# Patient Record
Sex: Female | Born: 1945 | Race: White | Hispanic: No | Marital: Married | Smoking: Former smoker
Health system: Southern US, Community
[De-identification: ages and names within clinical notes are randomized; demographics above are authoritative.]

## PROBLEM LIST (undated history)

## (undated) DIAGNOSIS — C679 Malignant neoplasm of bladder, unspecified: Secondary | ICD-10-CM

## (undated) DIAGNOSIS — C189 Malignant neoplasm of colon, unspecified: Secondary | ICD-10-CM

---

## 2012-06-08 DIAGNOSIS — Z01810 Encounter for preprocedural cardiovascular examination: Secondary | ICD-10-CM

## 2012-06-29 ENCOUNTER — Encounter: Payer: Medicare Other | Admitting: Hematology and Oncology

## 2012-06-29 DIAGNOSIS — C187 Malignant neoplasm of sigmoid colon: Secondary | ICD-10-CM

## 2012-06-29 DIAGNOSIS — Z800 Family history of malignant neoplasm of digestive organs: Secondary | ICD-10-CM

## 2012-06-29 DIAGNOSIS — Z808 Family history of malignant neoplasm of other organs or systems: Secondary | ICD-10-CM

## 2012-07-09 ENCOUNTER — Encounter: Payer: Medicare Other | Admitting: Hematology and Oncology

## 2012-07-09 ENCOUNTER — Other Ambulatory Visit: Payer: Self-pay | Admitting: Hematology and Oncology

## 2012-07-09 DIAGNOSIS — C189 Malignant neoplasm of colon, unspecified: Secondary | ICD-10-CM

## 2012-07-09 DIAGNOSIS — R970 Elevated carcinoembryonic antigen [CEA]: Secondary | ICD-10-CM

## 2012-07-12 ENCOUNTER — Encounter (HOSPITAL_COMMUNITY)
Admission: RE | Admit: 2012-07-12 | Discharge: 2012-07-12 | Disposition: A | Discharge status: Home / Self Care | Payer: Medicare Other | Source: Ambulatory Visit | Attending: Hematology and Oncology | Admitting: Hematology and Oncology

## 2012-07-12 DIAGNOSIS — I2510 Atherosclerotic heart disease of native coronary artery without angina pectoris: Secondary | ICD-10-CM | POA: Insufficient documentation

## 2012-07-12 DIAGNOSIS — I517 Cardiomegaly: Secondary | ICD-10-CM | POA: Insufficient documentation

## 2012-07-12 DIAGNOSIS — C189 Malignant neoplasm of colon, unspecified: Secondary | ICD-10-CM | POA: Insufficient documentation

## 2012-07-12 MED ORDER — FLUDEOXYGLUCOSE F - 18 (FDG) INJECTION
19.1000 | Freq: Once | INTRAVENOUS | Status: AC | PRN
  Administered 2012-07-12: 19.1 via INTRAVENOUS

## 2012-07-13 ENCOUNTER — Encounter: Payer: Medicare Other | Admitting: Hematology and Oncology

## 2012-07-13 DIAGNOSIS — R970 Elevated carcinoembryonic antigen [CEA]: Secondary | ICD-10-CM

## 2012-07-13 DIAGNOSIS — C187 Malignant neoplasm of sigmoid colon: Secondary | ICD-10-CM

## 2012-07-24 ENCOUNTER — Encounter: Payer: Medicare Other | Admitting: Hematology and Oncology

## 2012-07-24 DIAGNOSIS — C189 Malignant neoplasm of colon, unspecified: Secondary | ICD-10-CM

## 2012-07-24 DIAGNOSIS — Z5111 Encounter for antineoplastic chemotherapy: Secondary | ICD-10-CM

## 2012-07-26 DIAGNOSIS — C189 Malignant neoplasm of colon, unspecified: Secondary | ICD-10-CM

## 2012-08-06 DIAGNOSIS — R970 Elevated carcinoembryonic antigen [CEA]: Secondary | ICD-10-CM

## 2012-08-06 DIAGNOSIS — C3490 Malignant neoplasm of unspecified part of unspecified bronchus or lung: Secondary | ICD-10-CM

## 2012-08-17 ENCOUNTER — Encounter: Payer: Medicare Other | Admitting: Hematology and Oncology

## 2012-08-17 DIAGNOSIS — C189 Malignant neoplasm of colon, unspecified: Secondary | ICD-10-CM

## 2012-08-17 DIAGNOSIS — R970 Elevated carcinoembryonic antigen [CEA]: Secondary | ICD-10-CM

## 2012-08-21 DIAGNOSIS — C189 Malignant neoplasm of colon, unspecified: Secondary | ICD-10-CM

## 2012-08-21 DIAGNOSIS — Z5111 Encounter for antineoplastic chemotherapy: Secondary | ICD-10-CM

## 2012-08-23 DIAGNOSIS — C189 Malignant neoplasm of colon, unspecified: Secondary | ICD-10-CM

## 2012-08-23 DIAGNOSIS — Z452 Encounter for adjustment and management of vascular access device: Secondary | ICD-10-CM

## 2012-09-03 ENCOUNTER — Encounter: Payer: Medicare Other | Admitting: Hematology and Oncology

## 2012-09-03 DIAGNOSIS — C187 Malignant neoplasm of sigmoid colon: Secondary | ICD-10-CM

## 2012-09-10 ENCOUNTER — Encounter: Payer: Medicare Other | Admitting: Hematology and Oncology

## 2012-09-10 DIAGNOSIS — C187 Malignant neoplasm of sigmoid colon: Secondary | ICD-10-CM

## 2012-09-10 DIAGNOSIS — R970 Elevated carcinoembryonic antigen [CEA]: Secondary | ICD-10-CM

## 2012-09-11 DIAGNOSIS — C189 Malignant neoplasm of colon, unspecified: Secondary | ICD-10-CM

## 2012-09-11 DIAGNOSIS — Z5111 Encounter for antineoplastic chemotherapy: Secondary | ICD-10-CM

## 2012-09-13 DIAGNOSIS — C189 Malignant neoplasm of colon, unspecified: Secondary | ICD-10-CM

## 2012-09-14 DIAGNOSIS — T451X5A Adverse effect of antineoplastic and immunosuppressive drugs, initial encounter: Secondary | ICD-10-CM

## 2012-09-14 DIAGNOSIS — C189 Malignant neoplasm of colon, unspecified: Secondary | ICD-10-CM

## 2012-09-14 DIAGNOSIS — D702 Other drug-induced agranulocytosis: Secondary | ICD-10-CM

## 2012-10-01 DIAGNOSIS — C189 Malignant neoplasm of colon, unspecified: Secondary | ICD-10-CM

## 2012-10-02 DIAGNOSIS — C189 Malignant neoplasm of colon, unspecified: Secondary | ICD-10-CM

## 2012-10-02 DIAGNOSIS — Z5111 Encounter for antineoplastic chemotherapy: Secondary | ICD-10-CM

## 2012-10-04 DIAGNOSIS — C189 Malignant neoplasm of colon, unspecified: Secondary | ICD-10-CM

## 2012-10-05 DIAGNOSIS — C189 Malignant neoplasm of colon, unspecified: Secondary | ICD-10-CM

## 2012-10-05 DIAGNOSIS — T451X5A Adverse effect of antineoplastic and immunosuppressive drugs, initial encounter: Secondary | ICD-10-CM

## 2012-10-05 DIAGNOSIS — D702 Other drug-induced agranulocytosis: Secondary | ICD-10-CM

## 2012-10-22 DIAGNOSIS — C189 Malignant neoplasm of colon, unspecified: Secondary | ICD-10-CM

## 2012-10-23 DIAGNOSIS — C189 Malignant neoplasm of colon, unspecified: Secondary | ICD-10-CM

## 2012-10-23 DIAGNOSIS — Z5111 Encounter for antineoplastic chemotherapy: Secondary | ICD-10-CM

## 2012-10-25 DIAGNOSIS — C187 Malignant neoplasm of sigmoid colon: Secondary | ICD-10-CM

## 2012-10-26 DIAGNOSIS — T451X5A Adverse effect of antineoplastic and immunosuppressive drugs, initial encounter: Secondary | ICD-10-CM

## 2012-10-26 DIAGNOSIS — C189 Malignant neoplasm of colon, unspecified: Secondary | ICD-10-CM

## 2012-10-26 DIAGNOSIS — D702 Other drug-induced agranulocytosis: Secondary | ICD-10-CM

## 2012-11-12 DIAGNOSIS — C189 Malignant neoplasm of colon, unspecified: Secondary | ICD-10-CM

## 2012-11-12 DIAGNOSIS — Z5111 Encounter for antineoplastic chemotherapy: Secondary | ICD-10-CM

## 2012-11-14 DIAGNOSIS — C189 Malignant neoplasm of colon, unspecified: Secondary | ICD-10-CM

## 2012-12-04 DIAGNOSIS — T451X5A Adverse effect of antineoplastic and immunosuppressive drugs, initial encounter: Secondary | ICD-10-CM

## 2012-12-04 DIAGNOSIS — C189 Malignant neoplasm of colon, unspecified: Secondary | ICD-10-CM

## 2012-12-04 DIAGNOSIS — H5710 Ocular pain, unspecified eye: Secondary | ICD-10-CM

## 2012-12-05 DIAGNOSIS — Z5111 Encounter for antineoplastic chemotherapy: Secondary | ICD-10-CM

## 2012-12-05 DIAGNOSIS — C189 Malignant neoplasm of colon, unspecified: Secondary | ICD-10-CM

## 2012-12-07 DIAGNOSIS — C189 Malignant neoplasm of colon, unspecified: Secondary | ICD-10-CM

## 2012-12-24 DIAGNOSIS — C187 Malignant neoplasm of sigmoid colon: Secondary | ICD-10-CM

## 2012-12-25 DIAGNOSIS — C189 Malignant neoplasm of colon, unspecified: Secondary | ICD-10-CM

## 2012-12-25 DIAGNOSIS — Z5111 Encounter for antineoplastic chemotherapy: Secondary | ICD-10-CM

## 2012-12-27 DIAGNOSIS — C189 Malignant neoplasm of colon, unspecified: Secondary | ICD-10-CM

## 2013-01-15 DIAGNOSIS — C187 Malignant neoplasm of sigmoid colon: Secondary | ICD-10-CM

## 2013-01-15 DIAGNOSIS — D696 Thrombocytopenia, unspecified: Secondary | ICD-10-CM

## 2013-01-15 DIAGNOSIS — T451X5A Adverse effect of antineoplastic and immunosuppressive drugs, initial encounter: Secondary | ICD-10-CM

## 2013-01-15 DIAGNOSIS — G620 Drug-induced polyneuropathy: Secondary | ICD-10-CM

## 2013-01-16 DIAGNOSIS — Z5111 Encounter for antineoplastic chemotherapy: Secondary | ICD-10-CM

## 2013-01-16 DIAGNOSIS — C187 Malignant neoplasm of sigmoid colon: Secondary | ICD-10-CM

## 2013-01-18 DIAGNOSIS — C187 Malignant neoplasm of sigmoid colon: Secondary | ICD-10-CM

## 2013-01-30 DIAGNOSIS — Z5111 Encounter for antineoplastic chemotherapy: Secondary | ICD-10-CM

## 2013-01-30 DIAGNOSIS — C187 Malignant neoplasm of sigmoid colon: Secondary | ICD-10-CM

## 2013-02-01 DIAGNOSIS — C187 Malignant neoplasm of sigmoid colon: Secondary | ICD-10-CM

## 2013-02-13 DIAGNOSIS — C187 Malignant neoplasm of sigmoid colon: Secondary | ICD-10-CM

## 2013-02-13 DIAGNOSIS — Z5111 Encounter for antineoplastic chemotherapy: Secondary | ICD-10-CM

## 2013-02-15 DIAGNOSIS — C187 Malignant neoplasm of sigmoid colon: Secondary | ICD-10-CM

## 2013-03-01 DIAGNOSIS — C187 Malignant neoplasm of sigmoid colon: Secondary | ICD-10-CM

## 2013-03-01 DIAGNOSIS — R970 Elevated carcinoembryonic antigen [CEA]: Secondary | ICD-10-CM

## 2013-03-06 DIAGNOSIS — C189 Malignant neoplasm of colon, unspecified: Secondary | ICD-10-CM

## 2013-03-06 DIAGNOSIS — Z5111 Encounter for antineoplastic chemotherapy: Secondary | ICD-10-CM

## 2013-03-08 DIAGNOSIS — C187 Malignant neoplasm of sigmoid colon: Secondary | ICD-10-CM

## 2013-05-02 DIAGNOSIS — C189 Malignant neoplasm of colon, unspecified: Secondary | ICD-10-CM

## 2013-05-02 DIAGNOSIS — Z452 Encounter for adjustment and management of vascular access device: Secondary | ICD-10-CM

## 2013-05-14 DIAGNOSIS — Z800 Family history of malignant neoplasm of digestive organs: Secondary | ICD-10-CM

## 2013-05-14 DIAGNOSIS — C187 Malignant neoplasm of sigmoid colon: Secondary | ICD-10-CM

## 2013-05-15 ENCOUNTER — Telehealth: Payer: Self-pay | Admitting: Genetic Counselor

## 2013-05-15 NOTE — Telephone Encounter (Signed)
S/W PT IN [facility] APPT 07/21 @ 11 W/KAREN POWELL WELCOME PACKET MAILED.

## 2013-07-08 ENCOUNTER — Encounter: Payer: Medicare Other | Admitting: Genetic Counselor

## 2013-07-08 ENCOUNTER — Other Ambulatory Visit: Payer: Medicare Other | Admitting: Lab

## 2013-10-19 HISTORY — PX: COLON SURGERY: SHX602

## 2013-11-13 DIAGNOSIS — R970 Elevated carcinoembryonic antigen [CEA]: Secondary | ICD-10-CM

## 2013-11-13 DIAGNOSIS — C189 Malignant neoplasm of colon, unspecified: Secondary | ICD-10-CM

## 2015-12-10 ENCOUNTER — Other Ambulatory Visit: Payer: Self-pay | Admitting: Urology

## 2016-01-18 ENCOUNTER — Other Ambulatory Visit: Payer: Self-pay | Admitting: Urology

## 2016-01-26 ENCOUNTER — Inpatient Hospital Stay (HOSPITAL_COMMUNITY)
Admission: RE | Admit: 2016-01-26 | Discharge: 2016-02-05 | DRG: 654 | Disposition: A | Discharge status: Home Health | Payer: PPO | Source: Ambulatory Visit | Attending: Urology | Admitting: Urology

## 2016-01-26 ENCOUNTER — Encounter (HOSPITAL_COMMUNITY): Payer: Self-pay | Admitting: *Deleted

## 2016-01-26 DIAGNOSIS — Z936 Other artificial openings of urinary tract status: Secondary | ICD-10-CM

## 2016-01-26 DIAGNOSIS — M25559 Pain in unspecified hip: Secondary | ICD-10-CM | POA: Diagnosis not present

## 2016-01-26 DIAGNOSIS — K660 Peritoneal adhesions (postprocedural) (postinfection): Secondary | ICD-10-CM | POA: Diagnosis present

## 2016-01-26 DIAGNOSIS — Z87891 Personal history of nicotine dependence: Secondary | ICD-10-CM | POA: Diagnosis not present

## 2016-01-26 DIAGNOSIS — C799 Secondary malignant neoplasm of unspecified site: Secondary | ICD-10-CM | POA: Diagnosis not present

## 2016-01-26 DIAGNOSIS — N3946 Mixed incontinence: Secondary | ICD-10-CM | POA: Diagnosis present

## 2016-01-26 DIAGNOSIS — Z5331 Laparoscopic surgical procedure converted to open procedure: Secondary | ICD-10-CM

## 2016-01-26 DIAGNOSIS — C679 Malignant neoplasm of bladder, unspecified: Secondary | ICD-10-CM | POA: Diagnosis not present

## 2016-01-26 DIAGNOSIS — Z880 Allergy status to penicillin: Secondary | ICD-10-CM | POA: Diagnosis not present

## 2016-01-26 DIAGNOSIS — Z9049 Acquired absence of other specified parts of digestive tract: Secondary | ICD-10-CM

## 2016-01-26 DIAGNOSIS — D090 Carcinoma in situ of bladder: Secondary | ICD-10-CM | POA: Diagnosis present

## 2016-01-26 DIAGNOSIS — M549 Dorsalgia, unspecified: Secondary | ICD-10-CM | POA: Diagnosis not present

## 2016-01-26 DIAGNOSIS — Z85038 Personal history of other malignant neoplasm of large intestine: Secondary | ICD-10-CM

## 2016-01-26 DIAGNOSIS — K567 Ileus, unspecified: Secondary | ICD-10-CM | POA: Diagnosis not present

## 2016-01-26 DIAGNOSIS — K559 Vascular disorder of intestine, unspecified: Secondary | ICD-10-CM | POA: Diagnosis present

## 2016-01-26 DIAGNOSIS — D62 Acute posthemorrhagic anemia: Secondary | ICD-10-CM | POA: Diagnosis not present

## 2016-01-26 DIAGNOSIS — Z960 Presence of urogenital implants: Secondary | ICD-10-CM

## 2016-01-26 DIAGNOSIS — Z933 Colostomy status: Secondary | ICD-10-CM

## 2016-01-26 HISTORY — DX: Malignant neoplasm of colon, unspecified: C18.9

## 2016-01-26 HISTORY — DX: Malignant neoplasm of bladder, unspecified: C67.9

## 2016-01-26 LAB — COMPREHENSIVE METABOLIC PANEL
ALBUMIN: 4.4 g/dL (ref 3.5–5.0)
ALK PHOS: 58 U/L (ref 38–126)
ALT: 29 U/L (ref 14–54)
AST: 31 U/L (ref 15–41)
Anion gap: 9 (ref 5–15)
BILIRUBIN TOTAL: 1 mg/dL (ref 0.3–1.2)
BUN: 18 mg/dL (ref 6–20)
CALCIUM: 9 mg/dL (ref 8.9–10.3)
CO2: 28 mmol/L (ref 22–32)
Chloride: 101 mmol/L (ref 101–111)
Creatinine, Ser: 0.87 mg/dL (ref 0.44–1.00)
GFR calc Af Amer: >60 mL/min (ref >60)
GFR calc non Af Amer: >60 mL/min (ref >60)
GLUCOSE: 152 mg/dL — AB (ref 65–99)
Potassium: 4 mmol/L (ref 3.5–5.1)
Sodium: 138 mmol/L (ref 135–145)
TOTAL PROTEIN: 6.6 g/dL (ref 6.5–8.1)

## 2016-01-26 LAB — CBC
HEMATOCRIT: 44.8 % (ref 36.0–46.0)
HEMOGLOBIN: 14.5 g/dL (ref 12.0–15.0)
MCH: 29.7 pg (ref 26.0–34.0)
MCHC: 32.4 g/dL (ref 30.0–36.0)
MCV: 91.8 fL (ref 78.0–100.0)
Platelets: 172 10*3/uL (ref 150–400)
RBC: 4.88 MIL/uL (ref 3.87–5.11)
RDW: 13.3 % (ref 11.5–15.5)
WBC: 6.4 10*3/uL (ref 4.0–10.5)

## 2016-01-26 LAB — ABO/RH: ABO/RH(D): O POS

## 2016-01-26 LAB — PREPARE RBC (CROSSMATCH)

## 2016-01-26 MED ORDER — SODIUM CHLORIDE 0.9 % IV SOLN
INTRAVENOUS | Status: DC
  Administered 2016-01-26 – 2016-01-27 (×2): via INTRAVENOUS

## 2016-01-26 MED ORDER — ALVIMOPAN 12 MG PO CAPS
12.0000 mg | ORAL_CAPSULE | Freq: Once | ORAL | Status: AC
Start: 2016-01-27 — End: 2016-01-27
  Administered 2016-01-27: 12 mg via ORAL
  Filled 2016-01-26: qty 1

## 2016-01-26 MED ORDER — CLINDAMYCIN PHOSPHATE 900 MG/50ML IV SOLN
900.0000 mg | INTRAVENOUS | Status: DC
  Filled 2016-01-26: qty 50

## 2016-01-26 MED ORDER — PEG 3350-KCL-NA BICARB-NACL 420 G PO SOLR
4000.0000 mL | Freq: Once | ORAL | Status: AC
  Administered 2016-01-26: 4000 mL via ORAL

## 2016-01-26 MED ORDER — GENTAMICIN SULFATE 40 MG/ML IJ SOLN
5.0000 mg/kg | INTRAVENOUS | Status: AC
  Administered 2016-01-27: 320 mg via INTRAVENOUS
  Filled 2016-01-26: qty 8

## 2016-01-26 MED ORDER — GENTAMICIN IN SALINE 1.6-0.9 MG/ML-% IV SOLN: 80.0000 mg | INTRAVENOUS | Status: DC

## 2016-01-26 MED ORDER — CLINDAMYCIN PHOSPHATE 900 MG/50ML IV SOLN
900.0000 mg | INTRAVENOUS | Status: AC
  Administered 2016-01-27 (×2): 900 mg via INTRAVENOUS
  Filled 2016-01-26: qty 50

## 2016-01-26 NOTE — H&P (Signed)
[name] is an 70 y.o. female.    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 This was astutely found on eval for iritative voiding sympotms. 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 F/u surveillance with cysto / cytology / BX 10/2015 with recurrent carcinoma in situ.  2 - Urge-predominant urinary incontinence - Pt with several year bother form urge-predominant mixed incontincne. Presently managing with anticholinergic but only modest improvement.  PMH sig for colon resection with primary re-anastamosis for colon cancer (post-op adjuvant chemo, currently NED and compliant with colonoscopies, infraumbilical scar) Ortho surgery. Still has uterus / ovaries. She is quite active and avid golfer with severl of her girlfriends, one who had prior cystectomy. Her PCP is Dr. Quillian Quince with [facility] in [place].   Today " [name] " is seen for pre-admission testing / bowel prep prior to elective cystectomy tomorrow. No interval fevers.   No past medical history on file.  No past surgical history on file.  No family history on file. Social History:  has no tobacco, alcohol, and drug history on file.  Allergies:  Allergies  Allergen Reactions  . Ciprofloxacin Shortness Of Breath and Swelling  . Penicillins Hives    Has patient had a PCN reaction causing immediate rash, facial/tongue/throat swelling, SOB or lightheadedness with hypotension: No Has patient had a PCN reaction causing severe rash involving mucus membranes or skin necrosis: No Has patient had a PCN reaction that required hospitalization Yes Has patient had a PCN reaction occurring within the last 10 years: No If all of the above answers are "NO", then may proceed with Cephalosporin use.    Medications Prior to Admission  Medication  Sig Dispense Refill  . isoniazid (NYDRAZID) 300 MG tablet Take 300 mg by mouth daily.    . predniSONE (DELTASONE) 5 MG tablet Take 2.5-20 mg by mouth See admin instructions. Take 4 tablets daily for 2 days, then 2 tablets daily for 2 weeks, then 1 tablet daily for 2 weeks, then 0.5 tablet daily for 2 weeks      No results found for this or any previous visit (from the past 48 hour(s)). No results found.  Review of Systems  Constitutional: Negative.   HENT: Negative.   Eyes: Negative.   Respiratory: Negative.   Cardiovascular: Negative.   Gastrointestinal: Negative.   Genitourinary: Positive for urgency.  Musculoskeletal: Negative.   Skin: Negative.   Neurological: Negative.   Endo/Heme/Allergies: Negative.   Psychiatric/Behavioral: Negative.     There were no vitals taken for this visit. Physical Exam  Constitutional: She appears well-developed.  HENT:  Head: Normocephalic.  Eyes: Pupils are equal, round, and reactive to light.  Neck: Normal range of motion.  Cardiovascular: Normal rate.   Respiratory: Effort normal.  GI: Soft.  RLQ stomal marking noted. Infraumbilical midline scar w/o hernia.  Genitourinary:  No CVAT  Musculoskeletal: Normal range of motion.  Neurological: She is alert.  Skin: Skin is warm.  Psychiatric: She has a normal mood and affect. Her behavior is normal. Judgment and thought content normal.     Assessment/Plan   1 - BCG-refracotry carcinoma in situ - .   We rediscussed the role of radical cystectomy + lymph node dissection with concomitant prostatectomy in female and hysterectomy / oophorectomy in female and ileal conduit  urinary diversion with the overall goal of complete surgical excision (negative margins) and better staging / diagnosis. We specificallyrediscussed alternatives including chemo-radiation, palliative therapies, and the role of neoadjuvant chemotherapy. We then discussed surgical approaches including robotic and open techniques with  robotic associated with a shorter convalescence. I showed the patient on their abdomen the approximately 4-6 incision (trocar) sites as well as presumed extraction sites with robotic approach as well as possible open incision sites. I also showed them potential sites for the ileal conduit and spent significant time explaining the "plumbing" of this with regards to GI and GU tracts and specific risks of diversion including ureteral stricture. We specifically readdressed that there may be need to alter operative plans according to intraopertive findings including conversion to open procedure. We rediscussed specific peri-operative risks including bleeding, infection, deep vein thrombosis, pulmonary embolism, compartment syndrome, nuropathy / neuropraxia, bowel leak, bowel stricture, heart attack, stroke, death, as well as long-term risks such as non-cure / need for additional therapy and need for imaging and lab based post-op surveillance protocols. We rediscussed typical hospital course of approximately 5-7 day hospitalization, need for peri-operative drains / catheters, and typical post-hospital course with return to most non-strenuous activities by 4 weeks and ability to return to most jobs and more strenuous activity such as exercise by 8 weeks but with complete return to baseline often taking 65mo plus.  After this lengthy and detail discussion, including answering all of the patient's questions to their satisfaction, they have chosen to proceed with cystectomy with hyst / BSO, cysto-ICG, tomorrow as planned.   Given her piror colon surgery we discussed higher than standard risk of open conversion and need to alter operative plan. We specifically discussed even possibility of needing to proceed with end-colostomy and colon-conduit if severe adhesions otherwise precluded bladder removal and she voiced understanding of this and desire to proceed even if such measures were necessary.  Bowel prep, entereg, T+C  2u RBC.   2 - Urge-predominant urinary incontinence - this will resolve after surgery tomorrow.  MAlexis Frock MD 01/26/2016, 12:56 PM

## 2016-01-26 NOTE — Consult Note (Signed)
WOC ostomy consult note Patient seen for preoperative stoma site marking per Dr. Zettie Pho request.  Abdomen assessed in the sitting, standing and lying positions.  Rectus muscle is narrow, but easily palpated.  Stoma site is marked in the RUQ in a flat plane and within easy view of the patient. Ileal conduit marking is performed after cleansing of the abdomen with CHG wipe and that is allowed to dry x2.  A surgical skin marking pen is used and the mark is covered with a thin film transparent dressing.   RUQ Site selected is 6cm to the right of the umbilicus and 2cm above. Patient has a dear friend with a urostomy and her sister (who is now deceased) had a colostomy.  She reports that she is an avid golfer and cannot wait until she is released to play golf again. Patient understands the role of the [place] nurse, accepts educational materials although says she is not likely to read anything tonight.   [facility] nursing team will follow along with you, and will remain available to this patient, the nursing and medical teams.   Thanks, Maudie Flakes, MSN, RN, [place], Arther Abbott  Pager# [phone]

## 2016-01-26 NOTE — Anesthesia Preprocedure Evaluation (Addendum)
Anesthesia Evaluation  Patient identified by MRN, date of birth, ID band Patient awake    Reviewed: Allergy & Precautions, NPO status , Patient's Chart, lab work & pertinent test results  History of Anesthesia Complications Negative for: history of anesthetic complications  Airway Mallampati: II  TM Distance: >3 FB Neck ROM: Full    Dental  (+) Dental Advisory Given, Edentulous Upper, Upper Dentures   Pulmonary former smoker (quit 2.5 years ago),    Pulmonary exam normal breath sounds clear to auscultation       Cardiovascular Exercise Tolerance: Good (-) hypertension(-) angina(-) CAD and (-) CHF negative cardio ROS Normal cardiovascular exam Rhythm:Regular Rate:Normal     Neuro/Psych negative neurological ROS  negative psych ROS   GI/Hepatic Neg liver ROS, Colon cancer s/p resection 2 years ago. Completed chemo after surgery.  Bowel prep yesterday.   Endo/Other  negative endocrine ROS  Renal/GU negative Renal ROS   Bladder cancer    Musculoskeletal negative musculoskeletal ROS (+)   Abdominal   Peds  Hematology negative hematology ROS (+)   Anesthesia Other Findings Day of surgery medications reviewed with the patient.  Reproductive/Obstetrics negative OB ROS                           Anesthesia Physical Anesthesia Plan  ASA: III  Anesthesia Plan: General   Post-op Pain Management:    Induction: Intravenous  Airway Management Planned: Oral ETT  Additional Equipment: Arterial line  Intra-op Plan:   Post-operative Plan: Extubation in OR  Informed Consent: I have reviewed the patients History and Physical, chart, labs and discussed the procedure including the risks, benefits and alternatives for the proposed anesthesia with the patient or authorized representative who has indicated his/her understanding and acceptance.   Dental advisory given  Plan Discussed with:  CRNA  Anesthesia Plan Comments: (Risks/benefits of general anesthesia discussed with patient including risk of damage to teeth, lips, gum, and tongue, nausea/vomiting, allergic reactions to medications, and the possibility of heart attack, stroke and death.  All patient questions answered.  Patient wishes to proceed.  Arterial line + 2 large bore PIVs)       Anesthesia Quick Evaluation

## 2016-01-27 ENCOUNTER — Inpatient Hospital Stay (HOSPITAL_COMMUNITY): Payer: PPO

## 2016-01-27 ENCOUNTER — Inpatient Hospital Stay (HOSPITAL_COMMUNITY): Payer: PPO | Admitting: Anesthesiology

## 2016-01-27 ENCOUNTER — Encounter (HOSPITAL_COMMUNITY)
Admission: RE | Disposition: A | Discharge status: Home Health | Payer: Self-pay | Source: Ambulatory Visit | Attending: Urology

## 2016-01-27 ENCOUNTER — Encounter (HOSPITAL_COMMUNITY): Payer: Self-pay | Admitting: Anesthesiology

## 2016-01-27 DIAGNOSIS — C679 Malignant neoplasm of bladder, unspecified: Secondary | ICD-10-CM | POA: Diagnosis present

## 2016-01-27 DIAGNOSIS — Z936 Other artificial openings of urinary tract status: Secondary | ICD-10-CM

## 2016-01-27 DIAGNOSIS — Z933 Colostomy status: Secondary | ICD-10-CM

## 2016-01-27 HISTORY — PX: LYMPHADENECTOMY: SHX5960

## 2016-01-27 HISTORY — PX: COLOSTOMY CLOSURE: SHX1381

## 2016-01-27 HISTORY — PX: ROBOT ASSISTED LAPAROSCOPIC COMPLETE CYSTECT ILEAL CONDUIT: SHX5139

## 2016-01-27 HISTORY — PX: PARTIAL COLECTOMY: SHX5273

## 2016-01-27 HISTORY — PX: LAPAROSCOPIC LYSIS OF ADHESIONS: SHX5905

## 2016-01-27 LAB — BASIC METABOLIC PANEL
Anion gap: 8 (ref 5–15)
BUN: 8 mg/dL (ref 6–20)
CALCIUM: 7.4 mg/dL — AB (ref 8.9–10.3)
CO2: 23 mmol/L (ref 22–32)
CREATININE: 0.68 mg/dL (ref 0.44–1.00)
Chloride: 104 mmol/L (ref 101–111)
GFR calc non Af Amer: >60 mL/min (ref >60)
Glucose, Bld: 220 mg/dL — ABNORMAL HIGH (ref 65–99)
Potassium: 5 mmol/L (ref 3.5–5.1)
SODIUM: 135 mmol/L (ref 135–145)

## 2016-01-27 LAB — HEMOGLOBIN AND HEMATOCRIT, BLOOD
HCT: 28 % — ABNORMAL LOW (ref 36.0–46.0)
HEMOGLOBIN: 9.3 g/dL — AB (ref 12.0–15.0)

## 2016-01-27 LAB — SURGICAL PCR SCREEN
MRSA, PCR: POSITIVE — AB
Staphylococcus aureus: POSITIVE — AB

## 2016-01-27 SURGERY — CYSTECTOMY, ROBOT-ASSISTED, WITH ILEAL CONDUIT CREATION
Anesthesia: General

## 2016-01-27 MED ORDER — KCL IN DEXTROSE-NACL 20-5-0.45 MEQ/L-%-% IV SOLN
INTRAVENOUS | Status: DC
  Administered 2016-01-28 – 2016-02-01 (×8): via INTRAVENOUS
  Filled 2016-01-27 (×13): qty 1000

## 2016-01-27 MED ORDER — LACTATED RINGERS IR SOLN
Status: DC | PRN
  Administered 2016-01-27: 250 mL

## 2016-01-27 MED ORDER — ROCURONIUM BROMIDE 100 MG/10ML IV SOLN
INTRAVENOUS | Status: AC
  Filled 2016-01-27: qty 1

## 2016-01-27 MED ORDER — ALBUMIN HUMAN 5 % IV SOLN
INTRAVENOUS | Status: AC
  Filled 2016-01-27: qty 250

## 2016-01-27 MED ORDER — PHENYLEPHRINE HCL 10 MG/ML IJ SOLN
INTRAMUSCULAR | Status: DC | PRN
  Administered 2016-01-27 (×5): 40 ug via INTRAVENOUS

## 2016-01-27 MED ORDER — LACTATED RINGERS IV BOLUS (SEPSIS)
1000.0000 mL | Freq: Three times a day (TID) | INTRAVENOUS | Status: AC | PRN
  Administered 2016-01-28: 1000 mL via INTRAVENOUS
  Filled 2016-01-27: qty 1000

## 2016-01-27 MED ORDER — HYDROMORPHONE 1 MG/ML IV SOLN
INTRAVENOUS | Status: DC
  Administered 2016-01-28: 0.3 mg via INTRAVENOUS
  Administered 2016-01-28: 0.9 mg via INTRAVENOUS
  Administered 2016-01-28: 0.3 mg via INTRAVENOUS
  Administered 2016-01-28: 1.5 mg via INTRAVENOUS
  Administered 2016-01-28: 2.4 mg via INTRAVENOUS
  Administered 2016-01-29: 2.1 mg via INTRAVENOUS
  Administered 2016-01-29: 0.2 mg via INTRAVENOUS
  Administered 2016-01-29 (×2): 1.8 mg via INTRAVENOUS
  Administered 2016-01-29: 0.9 mg via INTRAVENOUS
  Administered 2016-01-30: 14:00:00 via INTRAVENOUS
  Administered 2016-01-30: 1.2 mg via INTRAVENOUS
  Administered 2016-01-30: 2.25 mg via INTRAVENOUS
  Administered 2016-01-30: 0.6 mg via INTRAVENOUS
  Administered 2016-01-30: 2.5 mg via INTRAVENOUS
  Administered 2016-01-30: 0.9 mg via INTRAVENOUS
  Administered 2016-01-31: 1.8 mg via INTRAVENOUS
  Administered 2016-01-31: 3 mg via INTRAVENOUS
  Administered 2016-01-31: 2.1 mg via INTRAVENOUS
  Administered 2016-01-31: 1.25 mg via INTRAVENOUS
  Administered 2016-01-31: 1.5 mg via INTRAVENOUS
  Administered 2016-01-31: 0.9 mg via INTRAVENOUS
  Administered 2016-02-01: 2.1 mg via INTRAVENOUS
  Administered 2016-02-01 (×2): 2.4 mg via INTRAVENOUS
  Administered 2016-02-01: 3 mg via INTRAVENOUS
  Administered 2016-02-01: 0.6 mg via INTRAVENOUS
  Administered 2016-02-01: 1.2 mg via INTRAVENOUS
  Administered 2016-02-02: 02:00:00 via INTRAVENOUS
  Administered 2016-02-02: 1.2 mg via INTRAVENOUS
  Filled 2016-01-27 (×2): qty 25

## 2016-01-27 MED ORDER — LABETALOL HCL 5 MG/ML IV SOLN
INTRAVENOUS | Status: DC | PRN
Start: 2016-01-27 — End: 2016-01-27
  Administered 2016-01-27: 5 mg via INTRAVENOUS
  Administered 2016-01-27: 2.5 mg via INTRAVENOUS

## 2016-01-27 MED ORDER — EPHEDRINE SULFATE 50 MG/ML IJ SOLN
INTRAMUSCULAR | Status: AC
  Filled 2016-01-27: qty 1

## 2016-01-27 MED ORDER — LACTATED RINGERS IV SOLN
INTRAVENOUS | Status: DC
  Administered 2016-01-27 (×2): via INTRAVENOUS
  Administered 2016-01-27: 1000 mL via INTRAVENOUS

## 2016-01-27 MED ORDER — EPHEDRINE SULFATE 50 MG/ML IJ SOLN
INTRAMUSCULAR | Status: DC | PRN
  Administered 2016-01-27: 10 mg via INTRAVENOUS

## 2016-01-27 MED ORDER — ONDANSETRON HCL 4 MG/2ML IJ SOLN
4.0000 mg | Freq: Four times a day (QID) | INTRAMUSCULAR | Status: DC | PRN
  Administered 2016-01-28 – 2016-01-30 (×6): 4 mg via INTRAVENOUS
  Filled 2016-01-27 (×6): qty 2

## 2016-01-27 MED ORDER — LIDOCAINE HCL (CARDIAC) 20 MG/ML IV SOLN
INTRAVENOUS | Status: DC | PRN
  Administered 2016-01-27: 50 mg via INTRAVENOUS

## 2016-01-27 MED ORDER — SUGAMMADEX SODIUM 200 MG/2ML IV SOLN
INTRAVENOUS | Status: AC
  Filled 2016-01-27: qty 2

## 2016-01-27 MED ORDER — HYDROMORPHONE HCL 2 MG/ML IJ SOLN
INTRAMUSCULAR | Status: AC
  Filled 2016-01-27: qty 1

## 2016-01-27 MED ORDER — SODIUM CHLORIDE 0.9 % IJ SOLN
INTRAMUSCULAR | Status: AC
  Filled 2016-01-27: qty 10

## 2016-01-27 MED ORDER — PROPOFOL 10 MG/ML IV BOLUS
INTRAVENOUS | Status: AC
  Filled 2016-01-27: qty 20

## 2016-01-27 MED ORDER — SODIUM CHLORIDE 0.9 % IJ SOLN
INTRAMUSCULAR | Status: AC
  Filled 2016-01-27: qty 20

## 2016-01-27 MED ORDER — PROPOFOL 10 MG/ML IV BOLUS
INTRAVENOUS | Status: DC | PRN
  Administered 2016-01-27: 150 mg via INTRAVENOUS

## 2016-01-27 MED ORDER — LIP MEDEX EX OINT
1.0000 "application " | TOPICAL_OINTMENT | Freq: Two times a day (BID) | CUTANEOUS | Status: DC
  Administered 2016-01-28 – 2016-02-05 (×17): 1 via TOPICAL
  Filled 2016-01-27: qty 7

## 2016-01-27 MED ORDER — ALUM & MAG HYDROXIDE-SIMETH 200-200-20 MG/5ML PO SUSP
30.0000 mL | Freq: Four times a day (QID) | ORAL | Status: DC | PRN
  Filled 2016-01-27: qty 30

## 2016-01-27 MED ORDER — FENTANYL CITRATE (PF) 250 MCG/5ML IJ SOLN
INTRAMUSCULAR | Status: AC
  Filled 2016-01-27: qty 5

## 2016-01-27 MED ORDER — FENTANYL CITRATE (PF) 100 MCG/2ML IJ SOLN: 25.0000 ug | INTRAMUSCULAR | Status: DC | PRN

## 2016-01-27 MED ORDER — FENTANYL CITRATE (PF) 100 MCG/2ML IJ SOLN
INTRAMUSCULAR | Status: DC | PRN
  Administered 2016-01-27 (×3): 50 ug via INTRAVENOUS
  Administered 2016-01-27: 25 ug via INTRAVENOUS
  Administered 2016-01-27 (×4): 50 ug via INTRAVENOUS
  Administered 2016-01-27: 25 ug via INTRAVENOUS
  Administered 2016-01-27 (×7): 50 ug via INTRAVENOUS
  Administered 2016-01-27: 100 ug via INTRAVENOUS

## 2016-01-27 MED ORDER — ALVIMOPAN 12 MG PO CAPS
12.0000 mg | ORAL_CAPSULE | Freq: Two times a day (BID) | ORAL | Status: DC
  Administered 2016-01-28 – 2016-02-01 (×10): 12 mg via ORAL
  Filled 2016-01-27 (×10): qty 1

## 2016-01-27 MED ORDER — FENTANYL CITRATE (PF) 100 MCG/2ML IJ SOLN
INTRAMUSCULAR | Status: AC
  Filled 2016-01-27: qty 2

## 2016-01-27 MED ORDER — HYDROMORPHONE HCL 1 MG/ML IJ SOLN
INTRAMUSCULAR | Status: DC | PRN
  Administered 2016-01-27: .2 mg via INTRAVENOUS
  Administered 2016-01-27: 0.5 mg via INTRAVENOUS
  Administered 2016-01-27 (×2): .4 mg via INTRAVENOUS
  Administered 2016-01-27: 0.5 mg via INTRAVENOUS
  Administered 2016-01-27 (×2): .2 mg via INTRAVENOUS
  Administered 2016-01-27: 0.5 mg via INTRAVENOUS
  Administered 2016-01-27: .4 mg via INTRAVENOUS
  Administered 2016-01-27: 0.5 mg via INTRAVENOUS
  Administered 2016-01-27: .2 mg via INTRAVENOUS

## 2016-01-27 MED ORDER — DIPHENHYDRAMINE HCL 50 MG/ML IJ SOLN
12.5000 mg | Freq: Four times a day (QID) | INTRAMUSCULAR | Status: DC | PRN
  Administered 2016-01-30: 12.5 mg via INTRAVENOUS
  Filled 2016-01-27 (×2): qty 1

## 2016-01-27 MED ORDER — SODIUM CHLORIDE 0.9 % IR SOLN
Status: DC | PRN
  Administered 2016-01-27: 1000 mL via INTRAVESICAL

## 2016-01-27 MED ORDER — SUGAMMADEX SODIUM 200 MG/2ML IV SOLN
INTRAVENOUS | Status: DC | PRN
  Administered 2016-01-27: 200 mg via INTRAVENOUS

## 2016-01-27 MED ORDER — HYDROMORPHONE 1 MG/ML IV SOLN
INTRAVENOUS | Status: AC
  Filled 2016-01-27: qty 25

## 2016-01-27 MED ORDER — HYDRALAZINE HCL 20 MG/ML IJ SOLN
INTRAMUSCULAR | Status: DC | PRN
  Administered 2016-01-27: 5 mg via INTRAVENOUS

## 2016-01-27 MED ORDER — CLINDAMYCIN PHOSPHATE 600 MG/50ML IV SOLN
600.0000 mg | Freq: Three times a day (TID) | INTRAVENOUS | Status: AC
  Administered 2016-01-28 – 2016-01-30 (×9): 600 mg via INTRAVENOUS
  Filled 2016-01-27 (×9): qty 50

## 2016-01-27 MED ORDER — MENTHOL 3 MG MT LOZG: 1.0000 | LOZENGE | OROMUCOSAL | Status: DC | PRN

## 2016-01-27 MED ORDER — CLINDAMYCIN PHOSPHATE 900 MG/50ML IV SOLN
INTRAVENOUS | Status: AC
  Filled 2016-01-27: qty 50

## 2016-01-27 MED ORDER — ACETAMINOPHEN 500 MG PO TABS
1000.0000 mg | ORAL_TABLET | Freq: Three times a day (TID) | ORAL | Status: DC
  Administered 2016-01-28 – 2016-01-30 (×7): 1000 mg via ORAL
  Filled 2016-01-27 (×7): qty 2

## 2016-01-27 MED ORDER — SODIUM CHLORIDE 0.9 % IJ SOLN
INTRAMUSCULAR | Status: DC | PRN
  Administered 2016-01-27: 20 mL

## 2016-01-27 MED ORDER — PHENOL 1.4 % MT LIQD: 2.0000 | OROMUCOSAL | Status: DC | PRN

## 2016-01-27 MED ORDER — SODIUM CHLORIDE 0.9% FLUSH: 9.0000 mL | INTRAVENOUS | Status: DC | PRN

## 2016-01-27 MED ORDER — ONDANSETRON HCL 4 MG/2ML IJ SOLN
INTRAMUSCULAR | Status: DC | PRN
  Administered 2016-01-27: 4 mg via INTRAVENOUS

## 2016-01-27 MED ORDER — HYDRALAZINE HCL 20 MG/ML IJ SOLN
INTRAMUSCULAR | Status: AC
  Filled 2016-01-27: qty 1

## 2016-01-27 MED ORDER — LABETALOL HCL 5 MG/ML IV SOLN
INTRAVENOUS | Status: AC
  Filled 2016-01-27: qty 4

## 2016-01-27 MED ORDER — MAGIC MOUTHWASH
15.0000 mL | Freq: Four times a day (QID) | ORAL | Status: DC | PRN
  Filled 2016-01-27: qty 15

## 2016-01-27 MED ORDER — NALOXONE HCL 0.4 MG/ML IJ SOLN: 0.4000 mg | INTRAMUSCULAR | Status: DC | PRN

## 2016-01-27 MED ORDER — LIDOCAINE HCL (CARDIAC) 20 MG/ML IV SOLN
INTRAVENOUS | Status: AC
  Filled 2016-01-27: qty 5

## 2016-01-27 MED ORDER — ROCURONIUM BROMIDE 100 MG/10ML IV SOLN
INTRAVENOUS | Status: DC | PRN
  Administered 2016-01-27 (×3): 20 mg via INTRAVENOUS
  Administered 2016-01-27: 10 mg via INTRAVENOUS
  Administered 2016-01-27: 20 mg via INTRAVENOUS
  Administered 2016-01-27: 50 mg via INTRAVENOUS
  Administered 2016-01-27: 10 mg via INTRAVENOUS
  Administered 2016-01-27 (×3): 20 mg via INTRAVENOUS
  Administered 2016-01-27 (×2): 10 mg via INTRAVENOUS

## 2016-01-27 MED ORDER — ALBUMIN HUMAN 5 % IV SOLN
INTRAVENOUS | Status: DC | PRN
Start: 2016-01-27 — End: 2016-01-27
  Administered 2016-01-27 (×2): via INTRAVENOUS

## 2016-01-27 MED ORDER — BUPIVACAINE LIPOSOME 1.3 % IJ SUSP
20.0000 mL | Freq: Once | INTRAMUSCULAR | Status: AC
  Administered 2016-01-27: 40 mL
  Filled 2016-01-27: qty 20

## 2016-01-27 MED ORDER — LACTATED RINGERS IV SOLN
INTRAVENOUS | Status: DC | PRN
  Administered 2016-01-27 (×6): via INTRAVENOUS

## 2016-01-27 MED ORDER — DIPHENHYDRAMINE HCL 12.5 MG/5ML PO ELIX: 12.5000 mg | ORAL_SOLUTION | Freq: Four times a day (QID) | ORAL | Status: DC | PRN

## 2016-01-27 MED ORDER — SUCCINYLCHOLINE CHLORIDE 20 MG/ML IJ SOLN
INTRAMUSCULAR | Status: DC | PRN
  Administered 2016-01-27: 100 mg via INTRAVENOUS

## 2016-01-27 MED ORDER — ONDANSETRON HCL 4 MG/2ML IJ SOLN: 4.0000 mg | Freq: Once | INTRAMUSCULAR | Status: DC | PRN

## 2016-01-27 SURGICAL SUPPLY — 86 items
APPLICATOR SURGIFLO ENDO (HEMOSTASIS) IMPLANT
BAG URO CATCHER STRL LF (MISCELLANEOUS) ×4 IMPLANT
BLADE EXTENDED COATED 6.5IN (ELECTRODE) ×4 IMPLANT
BLADE SURG SZ10 CARB STEEL (BLADE) IMPLANT
CABLE HIGH FREQUENCY MONO STRZ (ELECTRODE) IMPLANT
CATH FOLEY 2WAY SLVR 18FR 30CC (CATHETERS) ×4 IMPLANT
CATH ROBINSON RED A/P 16FR (CATHETERS) IMPLANT
CHLORAPREP W/TINT 26ML (MISCELLANEOUS) ×4 IMPLANT
CLIP LIGATING HEM O LOK PURPLE (MISCELLANEOUS) ×12 IMPLANT
CLIP LIGATING HEMO LOK XL GOLD (MISCELLANEOUS) ×8 IMPLANT
CLIP LIGATING HEMO O LOK GREEN (MISCELLANEOUS) ×8 IMPLANT
COVER TIP SHEARS 8 DVNC (MISCELLANEOUS) ×2 IMPLANT
COVER TIP SHEARS 8MM DA VINCI (MISCELLANEOUS) ×2
DECANTER SPIKE VIAL GLASS SM (MISCELLANEOUS) ×4 IMPLANT
DRAPE ARM DVNC X/XI (DISPOSABLE) ×8 IMPLANT
DRAPE COLUMN DVNC XI (DISPOSABLE) ×2 IMPLANT
DRAPE DA VINCI XI ARM (DISPOSABLE) ×8
DRAPE DA VINCI XI COLUMN (DISPOSABLE) ×2
DRAPE LAPAROSCOPIC ABDOMINAL (DRAPES) ×4 IMPLANT
DRAPE WARM FLUID 44X44 (DRAPE) ×4 IMPLANT
DRSG TEGADERM 6X8 (GAUZE/BANDAGES/DRESSINGS) IMPLANT
ELECT CAUTERY BLADE 6.4 (BLADE) ×4 IMPLANT
ELECT REM PT RETURN 9FT ADLT (ELECTROSURGICAL) ×4
ELECTRODE REM PT RTRN 9FT ADLT (ELECTROSURGICAL) ×2 IMPLANT
GLOVE BIO SURGEON STRL SZ 6.5 (GLOVE) ×6 IMPLANT
GLOVE BIO SURGEONS STRL SZ 6.5 (GLOVE) ×2
GLOVE BIOGEL M STRL SZ7.5 (GLOVE) ×12 IMPLANT
GOWN STRL REUS W/TWL LRG LVL3 (GOWN DISPOSABLE) ×20 IMPLANT
KIT PROCEDURE DA VINCI SI (MISCELLANEOUS) ×2
KIT PROCEDURE DVNC SI (MISCELLANEOUS) ×2 IMPLANT
LIGASURE IMPACT 36 18CM CVD LR (INSTRUMENTS) ×4 IMPLANT
LIQUID BAND (GAUZE/BANDAGES/DRESSINGS) IMPLANT
LOOP MINI RED (MISCELLANEOUS) IMPLANT
LOOP VESSEL MAXI BLUE (MISCELLANEOUS) ×4 IMPLANT
NEEDLE INSUFFLATION 14GA 120MM (NEEDLE) ×4 IMPLANT
PACK CYSTO (CUSTOM PROCEDURE TRAY) ×4 IMPLANT
PACK ROBOT UROLOGY CUSTOM (CUSTOM PROCEDURE TRAY) ×4 IMPLANT
PAD POSITIONING PINK XL (MISCELLANEOUS) ×4 IMPLANT
POUCH ENDO CATCH II 15MM (MISCELLANEOUS) IMPLANT
PUMP PAIN ON-Q (MISCELLANEOUS) IMPLANT
RELOAD PROXIMATE 75MM BLUE (ENDOMECHANICALS) ×4 IMPLANT
RELOAD STAPLER GREEN 60MM (STAPLE) ×2 IMPLANT
RELOAD STAPLER WHITE 60MM (STAPLE) ×4 IMPLANT
SEAL CANN UNIV 5-8 DVNC XI (MISCELLANEOUS) ×8 IMPLANT
SEAL XI 5MM-8MM UNIVERSAL (MISCELLANEOUS) ×8
SET IRRIG Y TYPE TUR BLADDER L (SET/KITS/TRAYS/PACK) ×4 IMPLANT
SET TUBE IRRIG SUCTION NO TIP (IRRIGATION / IRRIGATOR) ×4 IMPLANT
SOLUTION ELECTROLUBE (MISCELLANEOUS) ×4 IMPLANT
SPONGE LAP 18X18 X RAY DECT (DISPOSABLE) ×8 IMPLANT
SPONGE LAP 4X18 X RAY DECT (DISPOSABLE) ×4 IMPLANT
STAPLER ECHELON LONG 60 440 (INSTRUMENTS) ×4 IMPLANT
STAPLER PROXIMATE 75MM BLUE (STAPLE) ×8 IMPLANT
STAPLER RELOAD GREEN 60MM (STAPLE) ×4
STAPLER RELOAD WHITE 60MM (STAPLE) ×8
STAPLER VISISTAT 35W (STAPLE) ×4 IMPLANT
STENT SET URETHERAL LEFT 7FR (STENTS) ×4 IMPLANT
STENT SET URETHERAL RIGHT 7FR (STENTS) ×4 IMPLANT
SURGIFLO W/THROMBIN 8M KIT (HEMOSTASIS) IMPLANT
SUT CHROMIC 4 0 RB 1X27 (SUTURE) ×4 IMPLANT
SUT ETHILON 3 0 PS 1 (SUTURE) ×8 IMPLANT
SUT MNCRL 3 0 VIOLET RB1 (SUTURE) ×2 IMPLANT
SUT MNCRL AB 4-0 PS2 18 (SUTURE) ×8 IMPLANT
SUT MONOCRYL 3 0 RB1 (SUTURE) ×2
SUT PDS AB 0 CTX 36 PDP370T (SUTURE) ×24 IMPLANT
SUT PDS AB 2-0 CT2 27 (SUTURE) ×12 IMPLANT
SUT PROLENE 2 0 CT2 30 (SUTURE) ×8 IMPLANT
SUT SILK 2 0 SH CR/8 (SUTURE) ×8 IMPLANT
SUT SILK 3 0 SH 30 (SUTURE) IMPLANT
SUT SILK 3 0 SH CR/8 (SUTURE) IMPLANT
SUT VIC AB 2-0 SH 18 (SUTURE) ×16 IMPLANT
SUT VIC AB 2-0 UR5 27 (SUTURE) ×16 IMPLANT
SUT VIC AB 3-0 SH 27 (SUTURE) ×8
SUT VIC AB 3-0 SH 27X BRD (SUTURE) ×4 IMPLANT
SUT VIC AB 3-0 SH 27XBRD (SUTURE) ×4 IMPLANT
SUT VIC AB 4-0 PS2 18 (SUTURE) IMPLANT
SUT VIC AB 4-0 RB1 27 (SUTURE) ×8
SUT VIC AB 4-0 RB1 27XBRD (SUTURE) ×8 IMPLANT
SUT VICRYL 0 UR6 27IN ABS (SUTURE) ×4 IMPLANT
SUT VLOC BARB 180 ABS3/0GR12 (SUTURE) ×4
SUTURE VLOC BRB 180 ABS3/0GR12 (SUTURE) ×2 IMPLANT
SYSTEM UROSTOMY GENTLE TOUCH (WOUND CARE) IMPLANT
TOWEL OR NON WOVEN STRL DISP B (DISPOSABLE) ×4 IMPLANT
TROCAR BLADELESS 15MM (ENDOMECHANICALS) ×8 IMPLANT
WATER STERILE IRR 1000ML UROMA (IV SOLUTION) ×4 IMPLANT
WATER STERILE IRR 1500ML POUR (IV SOLUTION) IMPLANT
YANKAUER SUCT BULB TIP 10FT TU (MISCELLANEOUS) ×4 IMPLANT

## 2016-01-27 NOTE — Transfer of Care (Signed)
Immediate Anesthesia Transfer of Care Note  Patient: [name]  Procedure(s) Performed: Procedure(s): XI ROBOTIC ASSISTED LAPAROSCOPIC COMPLETE CYSTECT ILEAL CONDUIT, HYSTERECTOMY, BILATERAL OOPHORECTOMY,  CYSTOSCOPY WITH INDOCYANINE GREEN DYE CONVERTED TO OPEN (N/A) LYMPHADENECTOMY (N/A) EXTENSIVE LYSIS OF ADHESION, REPAIR OF ADHESIONS, SPLENIC FLEXURE MOBILIZATION, END COLOSTOMY, SIGMOID COLECTOMY (N/A)  Patient Location: PACU  Anesthesia Type:General  Level of Consciousness: awake  Airway & Oxygen Therapy: Patient Spontanous Breathing and Patient connected to face mask oxygen  Post-op Assessment: Report given to RN and Post -op Vital signs reviewed and stable  Post vital signs: Reviewed and stable  Last Vitals:  Filed Vitals:   01/26/16 2312 01/27/16 0618  BP: 146/55 132/58  Pulse: 61 62  Temp: 36.3 C 36.5 C  Resp: 18 18    Complications: No apparent anesthesia complications

## 2016-01-27 NOTE — Brief Op Note (Signed)
01/26/2016 - 01/27/2016  8:44 PM  PATIENT:  [name]  70 y.o. female  PRE-OPERATIVE DIAGNOSIS:  RECURRENT BLADDER CANCER  POST-OPERATIVE DIAGNOSIS:  RECURRENT BLADDER CANCER  PROCEDURE:  Procedure(s): XI ROBOTIC ASSISTED LAPAROSCOPIC COMPLETE CYSTECT ILEAL CONDUIT, HYSTERECTOMY, BILATERAL OOPHORECTOMY,  CYSTOSCOPY WITH INDOCYANINE GREEN DYE CONVERTED TO OPEN (N/A) LYMPHADENECTOMY (N/A) EXTENSIVE LYSIS OF ADHESION, REPAIR OF ADHESIONS, SPLENIC FLEXURE MOBILIZATION, END COLOSTOMY, SIGMOID COLECTOMY (N/A)  SURGEON:  Surgeon(s) and Role:    * Alexis Frock, MD - Primary    * Fanny Skates, MD - Assisting    * Michael Boston, MD - Assisting    * Michael Boston, MD - Assisting  PHYSICIAN ASSISTANT:   ASSISTANTS: Amand Dancy PA   ANESTHESIA:   local and general  EBL:  Total I/O In: 2000 [I.V.:2000] Out: 150 [Urine:50; Blood:100]  BLOOD ADMINISTERED:none  DRAINS: RLQ Urostomy to gravity with Rt (red) and Lt (blue) Bander stents. LLQ Colostomy, JP to bulb   LOCAL MEDICATIONS USED:  MARCAINE     SPECIMEN:  Source of Specimen:  Bladder, uterus, ovaries, cervis, anterior vaginal wall, urethra en-bloc  DISPOSITION OF SPECIMEN:  PATHOLOGY  COUNTS:  YES  TOURNIQUET:  * No tourniquets in log *  DICTATION: .Other Dictation: Dictation Number [id number]  PLAN OF CARE: Admit to inpatient   PATIENT DISPOSITION:  PACU - hemodynamically stable.   Delay start of Pharmacological VTE agent (>24hrs) due to surgical blood loss or risk of bleeding: yes

## 2016-01-27 NOTE — Op Note (Signed)
[name], [name] NO.:  [id number]  MEDICAL RECORD NO.:  [id number]  LOCATION:  [facility]                         FACILITY:  [facility]  PHYSICIAN:  Alexis Frock, MD     DATE OF BIRTH:  Sep 19, 1946  DATE OF PROCEDURE: 01/27/2016                               OPERATIVE REPORT   DIAGNOSIS:  Refractory high-grade superficial bladder cancer.  PROCEDURES: 1. Cystoscopy with indocyanine green dye injection for sentinel     lymphangiography. 2. Robotic converted to open radical cystectomy with pelvic     exenteration including hysterectomy with bilateral salpingo-     oophorectomy, total urethrectomy, bilateral pelvic lymphadenectomy. 3. Colon conduit urinary diversion. 4. Extensive laparoscopic adhesiolysis. 5. Partial colectomy with end colostomy as performed by General     Surgery team, Dr. Dalbert Batman and Dr. Johney Maine as per separate dictated     operative note.  ESTIMATED BLOOD LOSS:  1000 mL.  TRANSFUSION:  None.  DRAINS: 1. Right-sided Jackson-Pratt drain to bulb suction. 2. Colon conduit urostomy with Bander stents in situ, right is red and     left is blue to evaluate drainage. 3. Left lower quadrant end colostomy.  FINDINGS: 1. Extensive adhesions of the small bowel and large bowel to each     other into the anterior abdominal wall as well as into the omental     adhesions to the anterior abdominal wall necessitating extensive     laparoscopic as well as open adhesiolysis. 2. Dense adherence of prior area of colon anastomosis to the dome and     left wall of bladder with mobilization of these structures to     perform adequate pelvic exenteration.  It was felt that the area of     prior colon anastomosis would likely be nonviable due to ischemia.     Therefore, decision made to perform end colostomy joint decision by     myself and General Surgery colleagues.  This have been discussed as     a possibility with the patient preoperatively. 3. No gross adenopathy  in the pelvis, retroperitoneum. 4. No hydronephrosis. 5. No evidence of sentinel lymph nodes with ICG sentinel     lymphangiography.  ASSISTANT:  Debbrah Alar, PA.  SPECIMENS: 1. Segmental colon resection. 2. Right distal ureteral margin frozen section negative. 3. Left distal ureteral margin frozen section negative. 4. Right external iliac lymph nodes. 5. Right obturator lymph nodes. 6. Right common iliac lymph nodes. 7. Left common iliac lymph nodes. 8. Left external iliac lymph nodes. 9. Left obturator lymph nodes. 10.Bladder plus entire urethra plus uterus plus bilateral ovaries plus     cervix plus anterior vaginal wall en bloc.  INDICATIONS:  [name] is a very pleasant 70 year old lady with history of prior colon cancer, status post multimodal therapy several years ago, this included a segmental colon resection with side-to-side anastomosis was performed at referring facility.  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  This has been clinically localized with recent imaging.  Options were discussed with the primary urologist, Dr. Marica Otter in  [place], and clearly felt that surgical extirpation with curative intent cystectomy the most advantageous route of management and she was referred for consideration of this.  I evaluated the patient, reviewed relevant imaging, pathology studies and concurred that this would be the best opportunity for cure of her bladder cancer.  We discussed the perioperative course as well as the implications of her prior colon surgery with possible need for adhesiolysis, open conversion or even colostomy and she wished to proceed.  Informed consent was obtained and placed in the medical record.  She had been admitted preoperatively for bowel prep.  PROCEDURE IN DETAIL:  The patient being [name], was  verified. Procedure being robotic cystectomy with pelvic exenteration was confirmed.  Procedure was carried out.  Time-out was performed. Intravenous antibiotics were administered.  General endotracheal anesthesia was introduced.  The patient was initially placed into a low lithotomy position and sterile field was created by prepping and draping the patient's vagina, introitus, and proximal thighs using iodine x3. Next, cystourethroscopy was performed using a 23-French rigid cystoscope with 30-degree offset lens.  Inspection of the urinary bladder revealed multifocal erythema and flat and some nodular papillary tumor in all quadrants of the bladder, this did include the area of the bladder neck. This was consistent with known carcinoma in situ and high-grade superficial bladder cancer.  Ureteral orifices were unremarkable.  Next, 3 mL of indocyanine green dye was injected in small 0.2 mL aliquots toward the urinary bladder in the bleb-type fashion to perform sentinel lymphangiography.  Given the findings of diffuse involvement, it was felt that total urethrectomy would clearly be warranted later in the procedure.  Bladder was emptied per cystoscope.  The patient's arms were tucked after arterial line was placed.  She was suitably positioned on a nonskid foam pad.  She was further fashioned in the operating table using 3-inch tape over foam padding across her supraxiphoid chest.  A test of steep Trendelenburg positioning was performed and she was found to be suitably positioned.  Sterile field was recreated by prepping and draping the patient's vagina, introitus and proximal thighs using iodine and her infra-xiphoid abdomen using chlorhexidine gluconate.  After careful review of her preoperative CT scan in her notable infraumbilical scars, a high-flow, low-pressure pneumoperitoneum was obtained using Veress technique in the supraumbilical midline well away from the superior aspect of her  prior scar and in the site where there was no obvious underlying bowel.  An 8-mm robotic camera port was placed in this location.  Laparoscopic examination of the peritoneal cavity revealed multifocal dense adhesions, some of loops of bowel to the abdominal wall as well as some containing dense omental adhesions. There did appear to be an adequate window for placement of several additional ports.  As such, a right far lateral 12-mm assistant port and right paramedian 15-mm assistant port at the previously marked stomal site were then placed.  Next, using very careful dissection and laparoscopic scissors, very careful adhesiolysis was performed of multiple adhesions to the anterior abdominal wall for approximately an hour and a half.  This did allow visualization of the pelvis and a left far lateral 8-mm robotic port and left paramedian 8-mm robotic port were then placed.  Robot was docked and passed through the electronic checks. The pelvis was inspected under near-infrared fluorescence lights and with sentinel lymphangiography, no obvious sentinel lymph nodes were seen with minimal pelvic lymph node fields.  An additional approximately 8 hours of very careful adhesiolysis was performed.  Such  that, visualization of the right pelvis, right posterior cul-de-sac was obtained as visualization of the right ureter and iliac vessels in the area of the ileocecal junction.  However, due to incredibly dense adhesions of what appeared to be colon and omentum and the left lower quadrant did not allow safe visualization of the left lateral aspect of the bladder or visualization of the left iliac vessels and certainly not the left ureter.  So the safest way to proceed at this point, would be to convert to open approach and as such, the robot was undocked.  A midline incision was made from the pubis to position approximately a 7 cm above her umbilicus to allow wide exposure of her abdomen.  An  Omni- Tract retractor was deployed.  Inspection of the bowel revealed numerous residual adhesions throughout the entire bowel including multiple bowel- bowel adhesions within the large and small intestines.  The area of the ileocecal valve was identified and the bowel was run proximally all the way to the ligament of Treitz and multiple adhesions were very carefully taken down using Metzenbaum scissors taking great care to avoid bowel injury, which did not occur.  This small bowel suitably mobilized, it was packed away into the right upper quadrant, I think the right hemipelvis was easily visualized.  The posterior peritoneal incision was performed, the right ureter was dissected proximally and distally and marked the vessel loop and right external iliac, obturator and common iliac lymph node dissection was performed.  As such, in standard fashion, lymphostasis was achieved with cold clips.  The obturator nerve was inspected following these maneuvers and found to be visibly intact. Additional adhesiolysis was performed of dense omental adhesions and sigmoid adhesions in the left lower quadrant and it was clearly noted that the area of prior colonic anastomosis was incredibly adhesed to the area of the iliac vessels into the dome of the bladder.  It was felt that to achieve surgical extirpation of the bladder, likely very aggressive mobilization of this area of colon would be warranted, devascularization of this may certainly result.  As such, intraoperative General Surgery consultation was sought and my colleagues, Dr. Dalbert Batman and Dr. Johney Maine, kindly helped with additional colonic mobilization and adhesiolysis.  We all agreed that we achieved the goals of surgery today.  The sigmoid colon resection would clearly be warranted as the omentum mobilization necessary to allow better visualization of the left ureter, left lymph node fields, dome of the bladder would require this. As such,  mobilization was performed and resection of the prior area of anastomosis of the colon as per separate dictated operative note by General Surgery team.  This was incredibly helpful and did then allow suitable visualization and the left iliac vessels were coarse towards the area of the aortic bifurcation.  The left ureter was encountered, dissected distally for approximately 6 cm to the area of the ureterovesical junction and approximately 8 cm and lymphadenectomy was performed on the left side of left common iliac, external iliac, and obturator lymph nodes respectively.  The obturator nerve was inspected following these maneuvers and found to be intact.  There was adequate posterior gutter visualization and palpation.  As such, it was felt that proceeding with the complete cystectomy would be successful.  As such, bilateral ureters were dissected at the ureterovesical junction, doubly clipped and ligated with distal margin sent from each side, which were negative for carcinoma on frozen section.  An EEA Sizer was placed per vagina to allow the area  of the posterior cervix and posterior vaginal wall to be identified.  Incision was made into this area.  First, the right pedicle of the bladder, uterus and ovaries were taken down using very careful dissection with LigaSure device followed by the left side. This inherently also removed the strip of the anterior vaginal wall as planned.  Left lateral attachments and anterior attachments were taken down towards the area of the crus of the clitoris from abdominal pelvic approach.  At this point, after my attention at the ureterectomy and circumferential dissection was performed from the pelvic exposure incising around the urethral meatus and mobilizing circumferentially towards the area of the pelvic dissection, which only required approximately 2-3 cm dissection.  This completely freed up the cystectomy, en bloc specimen, which was set aside for  permanent pathology.  The pelvis was inspected.  Hemostasis appeared excellent. There was no evidence of rectal violation.  The vagina was reapproximated using running PDS clam shelling, the residual vagina into a hemostatically close fashion and the area of prior urethra was closed from pelvic approach, reapproximating the mucosa, resulted in excellent hemostasis from both the vaginal and pelvic approach areas.  With further discussion with our General Surgery colleagues, so that the most advantageous means of urinary and fecal diversion would be to perform by end colostomy with concomitant colon conduit urinary diversion with goal of avoiding additional bowel anastomosis.  As such, a suitably visibly viable segment of descending colon just distal to the splenic flexure was carefully taken out of continuity using bowel stapler and the proximal end being approximately 12 cm proximal to this to use as a colon conduit.  This was mobilized, taking great care to avoid devascularization of its mesentery and it easily laid in the proximal position of the right hemiabdomen and it was felt that this would be adequate for urinary diversion.  More proximal aspect was mobilized as per General Surgery colleagues performed end colostomy.  Attention was directed at colon conduit diversion.  First, the left ureteral colonic anastomosis was performed after excluding the proximal staple line using running silk.  This was performed by excising a small patch approximately 6 mm of colonic serosa and mucosa and everting the edges x4 and placing a heel stitch of the left ureter and then ureterocolonic mucosa anastomosis using running Vicryl in two separate suture lines with a blue-colored Bander stent placed 26 cm to the anastomosis, which was anchored via a through-and-through 4-0 chromic at the area of the colonic portion.  Similarly, the right colonic ureteral anastomosis was performed purposely not in the  tinea in a similar fashion with a red- colored Bander stent at 27 cm to the anastomosis.  A closed suction drain was brought through the previous left paramedian robotic port site, the prior left lateral most assistant port site, was closed at the level of fascia using interrupted Vicryl.  The pelvis and bowel were inspected.  There was no evidence of visceral injury.  Sponge and needle counts were correct.  Hemostasis appeared excellent.  The urinary conduit was matured by bringing approximately 5 cm of viable-appearing isoperistaltic distal aspect of this segment.  Through a window created at the prior 15-mm port site, which had been previously marked by enterostomal therapy.  This dilated to accommodate three surgeons fingers.  This was brought through anchored at the level of fascia using Vicryl x4.  The left colostomy was brought through, but not yet matured as per separate operative note by General Surgery team.  Fascia was reapproximated using figure-of-eight PDS followed by running Scarpa's and interrupted stapling technique of the skin.  The conduit was matured in a rosebud-type fashion, then ostomy appliance applied and there was copious pink tinged urine from both stents and the left end colostomy was matured as per separate operative note by the General Surgery team. The procedure was terminated.  The patient tolerated the procedure well. There were no immediate periprocedural complications.  The patient was taken to the postanesthesia care unit in stable condition with plan for step-down admission.          ______________________________ Alexis Frock, MD     TM/MEDQ  D:  01/27/2016  T:  01/27/2016  Job:  [id number]

## 2016-01-27 NOTE — Op Note (Signed)
Patient Name:           [name]   Date of Surgery:        01/27/2016  Pre op Diagnosis:      Carcinoma of the bladder                                       History sigmoid colon cancer and sigmoid colectomy  Post op Diagnosis:    Carcinoma of the bladder                                       History sigmoid colon cancer and sigmoid colectomy                                        Complex intra-abdominal adhesions  Procedure:                 Intraoperative general surgery consult                                      Lysis of adhesions                                      Sigmoid colectomy with resection of prior anastomosis, colostomy planned  Surgeon:                     Edsel Petrin. Dalbert Batman, M.D., FACS  Assistant:                      Dr. Alexis Frock  Operative Indications:   I was called to the operating room by Dr. Tresa Moore to assist with assessment of bowel and bowel adhesions.  Upon arrival he discussed the patient's history with me.  He advised me that she had undergone a bowel prep and was told that she might need to have a colostomy because of the complex of the of her planned surgery.  He had been operating for 4 hours and had exposed the right retroperitoneum and had isolated the right ureter.  Isolated the left iliac vessel but had not identified the left ureter because of dense adhesions.  I scrubbed in to assist with mobilization of the adhesions of the colon and exposure of the retroperitoneum  Operative Findings:       The patient had what appeared to be a side to side stapled anastomosis of the mid sigmoid colon.  This was twisted upon itself and densely adherent with fibrotic adhesions to the lateral abdominal wall and the retroperitoneum.  The anatomy was distorted and this took a great deal of time to dissected: Away.  We were ultimately able to get the colon mobilized off of the left retroperitoneum without any injury to the retroperitoneal structures.  We were able to  identify the left ureter and pass a loop around it.  The rectum looked healthy.  The descending colon looked healthy.  Some of the mesentery had been destroyed and the anastomosis looked ischemic and so had to be resected.  Procedure  in Detail:          Given the history detailed above I scrubbed into the operating room.  There was a large midline incision with self-retaining retractors.  Operative findings were as described above.  I spent about 45 minutes trying to identify the anatomy of the left colon and proximal rectum.  I basically dissected the colon from distal to proximal slowly dividing adhesions and omentum off of this.  I had to slowly dissect this off of the retroperitoneum in small steps  to avoid injury to the left retroperitoneal structures.  Ultimately had the colon completely mobilized up.  I chose to resect the ischemic anastomosis.  I fired a Engineer, maintenance across the distal descending colon and marked this with 2 Prolene sutures.  I fired a Engineer, maintenance across the proximal rectum and placed 2 Prolene sutures and the rectal stump.  I marked the distal anastomosis with a suture and sent the specimen to the lab for routine histology with the appropriate history of colon cancer.    Dr. Tresa Moore and I discussed the conduct of the case.  It was his preference to proceed with mobilization of the ureters and the cystectomy and ileal loop conduit and then to call us back to form the colostomy at the end of the case.  Hemostasis was good at this point.  I scrubbed out and discussed the case with Dr. Alwyn Pea who is on-call tonight.  They will call Dr. gross to perform the end left colostomy at the appropriate time during the case.  For the duration of time that I was in the operating room, the estimate blood loss was about 75 mL.  There were no complications.     Edsel Petrin. Dalbert Batman, M.D., FACS General and Minimally Invasive Surgery Breast and Colorectal Surgery  01/27/2016 4:37 PM

## 2016-01-27 NOTE — Op Note (Signed)
01/27/2016  8:56 PM  PATIENT:  [name]  70 y.o. female  Patient Care Team: Provider Default, MD as PCP - General  PRE-OPERATIVE DIAGNOSIS:  RECURRENT BLADDER CANCER  POST-OPERATIVE DIAGNOSIS:  RECURRENT BLADDER CANCER  PROCEDURE:  Procedure(s):  LYSIS OF ADHESIONS SPLENIC FLEXURE MOBILIZATION END COLOSTOMY  Surgeon(s):  Alexa Boston, MD  ASSISTANT: Alexa Frock, MD   ANESTHESIA:   local and general  EBL:  Total I/O In: 2000 [I.V.:2000] Out: 150 [Urine:50; Blood:100]  Delay start of Pharmacological VTE agent (>24hrs) due to surgical blood loss or risk of bleeding:  no  DRAINS: Penrose drain in the Pelvic drain per Urology   SPECIMEN:  No Specimen  DISPOSITION OF SPECIMEN:  N/A  COUNTS:  YES  PLAN OF CARE: Admit to inpatient   PATIENT DISPOSITION:  OR - hemodynamically stable  INDICATION: Patient with bladder cancer.  Recommended to have cystectomy with conduit urostomy.  [name].  Underwent robotic converted to open lysis of adhesions.  Prior history of colectomy for left-sided colon cancer.  Required [name] for lysis of adhesions and partial colectomy of ischemic segment.  Recommendation made for colostomy later given the extensively long case and artery presence of ostomy.  OR FINDINGS: Status post colectomy by [name].  At the level of distal descending colon.  Inferior mesenteric artery and vein pedicles intact.  Colon conduit made from descending colon with intact IMA and left colon pedicle.  Used for ureteral colonic conduit for permanent urostomy.  See urology operative note.  End proximal descending colon colostomy in left upper quadrant.  DESCRIPTION: The patient was already intubated with a large laparotomy incision.  Please see prior operative notes.  Patient had had sigmoid colectomy done.  Distal and had permanent blue Prolene suture on it for identification.  Rectal stump was intact and left known.  The remaining colon remained quite  viable.   in the fact that blood loss was actually quite low and the patient had been hemodynamically stable with good urine output, [name] wished to use the colon as the urine conduit if possible.    In order for that to reach on the contralateral side, I needed more colon mobility.  Therefore I performed splenic flexion mobilization.   I mobilized the colon in a lateral to medial fashion from the distal descending colon more proximally.  Freed off the left kidney and left retroperitoneum.  We could see the course the left ureter, well mobilized and protected.   I created a window through the mid transverse colon and greater omentum.  The lesser sacs with quite closed off  with soft but dense adhesions.  Eventually found a good avascular plane.  I freed off the distal transverse colon and splenic flexure off its retroperitoneal attachments.  This provided good mobility for the transverse colon to reach down to the lower abdomen and pelvis..   The distal descending colon could actually rotate clockwise  with the most distal and easily reaching towards the left upper quadrant quite easily.  Mesentery had no significant torsion.  Looked viable.   therefore we decided to proceed with the colon as a conduit and avoid using the ileum.  Therefore, I transected at the proximal descending colon.  That area had the most stretched out mesentery.  Used a 45 GIA stapler.  Split the mesentery a little more proximally radially, preserving the IMA and left colon pedicles for the colon conduit for ureteral implantation and creation of permanent urostomy in  the right upper quadrant.  I created a circular defect in the left upper quadrant in a mirror image from the premarked urostomy area in the right upper quadrant.  Size some subcutaneous fat.  I split the anterior rectus fascia transversely.  I split through the rectus Monsel's and posterior rectus fascia and peritoneum vertically.  Dilated to 2 fingers.  It up the end of  the proximal descending colon.  Came out 5 cm.  I secured it to the posterior rectus fascia intra peritoneally using interrupted 2-0 silk sutures x5.  I secured it to the anterior rectus fascia  through the skin wound using interrupted 2-0 silk suture x5.  minimal duskiness at the tip.  Colon looked viable.  No twisting nor torsion of the mesentery.    At this point [name] completed cystectomy and began creation of the conduit to the colon.  A contralateral defect was made for the colon ureteral conduit.  Midline fascia was closed per urology.  Staples placed.  See his notes for details.     At this point after the midline incision was covered , I created a left upper quadrant colostomy.  I secured  the colon at 4 corners using 2-0 Vicryl interrupted suture at the level of the dermis.   I transected the staple line.  I inspected the distal 1.5 cm as it was somewhat ischemic until I got better bleeding tissue.  Much less duskiness more proximally.  I then proceeded to use interrupted sutures to fill in the gaps finger intubation was a nice straight shot through the fascia into the peritoneum.  Ostomy appliance placed.   Patient is being ready to be extubated for the recovery room.  We will follow expectantly.   [name], M.D., F.A.C.S. Gastrointestinal and Minimally Invasive Surgery [facility] [address] [place], [place] [address] [phone] Main / Paging

## 2016-01-27 NOTE — Anesthesia Procedure Notes (Signed)

## 2016-01-27 NOTE — Anesthesia Postprocedure Evaluation (Signed)
Anesthesia Post Note  Patient: [name]  Procedure(s) Performed: Procedure(s) (LRB): XI ROBOTIC ASSISTED LAPAROSCOPIC COMPLETE CYSTECT ILEAL CONDUIT, HYSTERECTOMY, BILATERAL OOPHORECTOMY,  CYSTOSCOPY WITH INDOCYANINE GREEN DYE CONVERTED TO OPEN (N/A) LYMPHADENECTOMY (N/A) EXTENSIVE LYSIS OF ADHESION, REPAIR OF ADHESIONS, SPLENIC FLEXURE MOBILIZATION, END COLOSTOMY, SIGMOID COLECTOMY (N/A)  Patient location during evaluation: PACU Anesthesia Type: General Level of consciousness: awake and alert and oriented Pain management: pain level controlled Vital Signs Assessment: post-procedure vital signs reviewed and stable Respiratory status: spontaneous breathing and patient connected to nasal cannula oxygen Cardiovascular status: blood pressure returned to baseline and stable Anesthetic complications: no    Last Vitals:  Filed Vitals:   01/27/16 2215 01/27/16 2230  BP: 120/50 112/42  Pulse: 101 105  Temp:  37.2 C  Resp: 16 15    Last Pain:  Filed Vitals:   01/27/16 2237  PainSc: 0-No pain                 Maresha Anastos L.

## 2016-01-27 NOTE — Progress Notes (Signed)
Pharmacy Antibiotic Note  [name] is a 70 y.o. female admitted on 01/26/2016 s/p surgery for carcinoma of the bladder.   Pharmacy has been consulted for clindamycin dosing.  Plan:  Gentamicin  320 mg IV q24h x 3 days post-op  Will check random level 2100 tonight to ensure extended interval dosing appropriate  Height: '5\' 5"'$  (165.1 cm) Weight: 158 lb 9.6 oz (71.94 kg) IBW/kg (Calculated) : 57  Temp (24hrs), Avg:98.2 F (36.8 C), Min:97.3 F (36.3 C), Max:99 F (37.2 C)   Recent Labs Lab 01/26/16 1356 01/27/16 2157  WBC 6.4  --   CREATININE 0.87 0.68    Estimated Creatinine Clearance: 66 mL/min (by C-G formula based on Cr of 0.68).    Allergies  Allergen Reactions  . Ciprofloxacin Shortness Of Breath and Swelling  . Penicillins Hives    Has patient had a PCN reaction causing immediate rash, facial/tongue/throat swelling, SOB or lightheadedness with hypotension: No Has patient had a PCN reaction causing severe rash involving mucus membranes or skin necrosis: No Has patient had a PCN reaction that required hospitalization Yes Has patient had a PCN reaction occurring within the last 10 years: No If all of the above answers are "NO", then may proceed with Cephalosporin use.    Antimicrobials this admission: 2/8 gentamicin >>  2/8  clindamycin >>   Dose adjustments this admission:   Microbiology results:  BCx:   UCx:    Sputum:    MRSA PCR:   Thank you for allowing pharmacy to be a part of this patient's care.  Dorrene German 01/27/2016 11:10 PM

## 2016-01-27 NOTE — Care Management Note (Signed)
Case Management Note  Patient Details  Name: [name] MRN: [id number] Date of Birth: 1946/10/25  Subjective/Objective:      Pt admitted for surgery/Bladder CA             Action/Plan:  Plan to dc home   Expected Discharge Date:                  Expected Discharge Plan:  Home/Self Care  In-House Referral:  NA  Discharge planning Services  CM Consult  Post Acute Care Choice:  NA Choice offered to:     DME Arranged:    DME Agency:     HH Arranged:    HH Agency:     Status of Service:  In process, will continue to follow  Medicare Important Message Given:    Date Medicare IM Given:    Medicare IM give by:    Date Additional Medicare IM Given:    Additional Medicare Important Message give by:     If discussed at [place] of Stay Meetings, dates discussed:    Additional CommentsPurcell Mouton, RN 01/27/2016, 5:01 PM

## 2016-01-27 NOTE — Discharge Instructions (Signed)

## 2016-01-27 NOTE — Progress Notes (Signed)
S: completed bowel prep to clear, no complaints.  O: NAD, working on puzzle SNTND, stomal markig again noted No c/c/e  A/P Proceed today as planned with cystectomy / TAH/ BSO / Cysto-ICG. WE again frankly discussed possibiliity of adhesiolysis, open conversion, or even concomitant colosotmy pending intra-op course and she voiced understanding and desire to proceed.

## 2016-01-28 ENCOUNTER — Encounter (HOSPITAL_COMMUNITY): Payer: Self-pay | Admitting: Urology

## 2016-01-28 LAB — BASIC METABOLIC PANEL
ANION GAP: 8 (ref 5–15)
BUN: 10 mg/dL (ref 6–20)
CALCIUM: 7.6 mg/dL — AB (ref 8.9–10.3)
CO2: 24 mmol/L (ref 22–32)
CREATININE: 0.9 mg/dL (ref 0.44–1.00)
Chloride: 105 mmol/L (ref 101–111)
GFR calc Af Amer: >60 mL/min (ref >60)
GFR calc non Af Amer: >60 mL/min (ref >60)
GLUCOSE: 258 mg/dL — AB (ref 65–99)
POTASSIUM: 4.7 mmol/L (ref 3.5–5.1)
Sodium: 137 mmol/L (ref 135–145)

## 2016-01-28 LAB — GENTAMICIN LEVEL, RANDOM: Gentamicin Rm: <0.5 ug/mL

## 2016-01-28 LAB — HEMOGLOBIN AND HEMATOCRIT, BLOOD
HCT: 27.9 % — ABNORMAL LOW (ref 36.0–46.0)
HEMOGLOBIN: 9.3 g/dL — AB (ref 12.0–15.0)

## 2016-01-28 MED ORDER — CHLORHEXIDINE GLUCONATE CLOTH 2 % EX PADS
6.0000 | MEDICATED_PAD | Freq: Every day | CUTANEOUS | Status: AC
  Administered 2016-01-28 – 2016-01-31 (×4): 6 via TOPICAL

## 2016-01-28 MED ORDER — SODIUM CHLORIDE 0.9 % IV SOLN
Freq: Once | INTRAVENOUS | Status: AC
  Administered 2016-01-28: 22:00:00 via INTRAVENOUS

## 2016-01-28 MED ORDER — SODIUM CHLORIDE 0.9 % IV BOLUS (SEPSIS)
500.0000 mL | Freq: Once | INTRAVENOUS | Status: AC
  Administered 2016-01-28: 500 mL via INTRAVENOUS

## 2016-01-28 MED ORDER — GENTAMICIN SULFATE 40 MG/ML IJ SOLN
5.0000 mg/kg | INTRAVENOUS | Status: AC
  Administered 2016-01-28 – 2016-01-30 (×3): 320 mg via INTRAVENOUS
  Filled 2016-01-28 (×3): qty 8

## 2016-01-28 MED ORDER — HEPARIN SODIUM (PORCINE) 5000 UNIT/ML IJ SOLN
5000.0000 [IU] | Freq: Three times a day (TID) | INTRAMUSCULAR | Status: DC
  Administered 2016-01-28 – 2016-01-29 (×3): 5000 [IU] via SUBCUTANEOUS
  Filled 2016-01-28 (×3): qty 1

## 2016-01-28 MED ORDER — MUPIROCIN 2 % EX OINT
1.0000 "application " | TOPICAL_OINTMENT | Freq: Two times a day (BID) | CUTANEOUS | Status: AC
  Administered 2016-01-28 – 2016-02-01 (×10): 1 via NASAL
  Filled 2016-01-28: qty 22

## 2016-01-28 NOTE — Progress Notes (Signed)
PT Cancellation Note  Patient Details Name: [name] MRN: [id number] DOB: 06-03-1946   Cancelled Treatment:    Reason Eval/Treat Not Completed: Patient not medically ready (per RN, low BP sitting on EOB. Will check back this PM.)   Claretha Cooper 01/28/2016, 10:18 AM Tresa Endo PT [phone]

## 2016-01-28 NOTE — Evaluation (Signed)
Physical Therapy Evaluation Patient Details Name: [name] MRN: [id number] DOB: 1946-04-04 Today's Date: 01/28/2016   History of Present Illness   ^70 yo female with h/o colon cancer ,high grade bladder cancer admitted 01/26/16 for Cystoscopy, Robotic converted to open radical cystectomy , hysterectomy, total urethrectomy, bilateral pelvic lymphadenectomy. Colon conduit urinary diversion. Extensive laparoscopic adhesiolysis. Partial colectomy with end colostomy as performed 01/27/16,  Clinical Impression  Patient was able to mobilize to recliner with 2 person assist.  RN  Assisting due to previous low BP. Now 91/63/ patient will benefit from PT to address problems listed in the note below to Dc to home. Recommend OT consult.    Follow Up Recommendations Home health PT;Supervision/Assistance - 24 hour    Equipment Recommendations  Rolling walker with 5" wheels    Recommendations for Other Services OT consult     Precautions / Restrictions Precautions Precaution Comments: monitor  for low BP, colostomy, urostomy,drain x 1      Mobility  Bed Mobility Overal bed mobility: Needs Assistance Bed Mobility: Rolling;Sidelying to Sit Rolling: Min assist Sidelying to sit: Mod assist       General bed mobility comments: cues for rolling,protecting abdomen  Transfers Overall transfer level: Needs assistance Equipment used: 2 person hand held assist Transfers: Sit to/from [facility] Sit to Stand: Mod assist;+2 physical assistance;+2 safety/equipment Stand pivot transfers: Mod assist;+2 physical assistance;+2 safety/equipment       General transfer comment: extra time to rise, steady on the feet, pivot steps to recliner with 2 handhold. BP 91/ 62., sats 94% RA during mobility. noted to drop to 88% after in recliner and resting. Replaced on 2 liters  Ambulation/Gait                Stairs            Wheelchair Mobility    Modified Rankin (Stroke  Patients Only)       Balance Overall balance assessment: Needs assistance Sitting-balance support: Feet supported;Bilateral upper extremity supported Sitting balance-Leahy Scale: Fair     Standing balance support: During functional activity;Bilateral upper extremity supported Standing balance-Leahy Scale: Fair                               Pertinent Vitals/Pain Pain Assessment: 0-10 Pain Score: 6  Pain Location: abdomen Pain Descriptors / Indicators: Discomfort;Guarding;Aching Pain Intervention(s): Monitored during session;PCA encouraged    Home Living Family/patient expects to be discharged to:: Private residence Living Arrangements: Spouse/significant other Available Help at Discharge: Family Type of Home: House Home Access: Stairs to enter   Technical brewer of Steps: 2 Home Layout: One level Home Equipment: None      Prior Function Level of Independence: Independent               Hand Dominance        Extremity/Trunk Assessment   Upper Extremity Assessment: Generalized weakness           Lower Extremity Assessment: Generalized weakness      Cervical / Trunk Assessment: Normal  Communication   Communication: No difficulties  Cognition Arousal/Alertness: Awake/alert Behavior During Therapy: WFL for tasks assessed/performed Overall Cognitive Status: Within Functional Limits for tasks assessed                      General Comments      Exercises        Assessment/Plan  PT Assessment Patient needs continued PT services  PT Diagnosis Difficulty walking;Generalized weakness;Acute pain   PT Problem List Decreased strength;Decreased activity tolerance;Decreased balance;Decreased mobility;Decreased knowledge of precautions;Decreased safety awareness;Decreased knowledge of use of DME;Cardiopulmonary status limiting activity  PT Treatment Interventions DME instruction;Gait training;Functional mobility  training;Therapeutic activities;Therapeutic exercise;Patient/family education   PT Goals (Current goals can be found in the Care Plan section) Acute Rehab PT Goals Patient Stated Goal: agreed to get up. PT Goal Formulation: With patient Time For Goal Achievement: 02/11/16 Potential to Achieve Goals: Good    Frequency Min 3X/week   Barriers to discharge        Co-evaluation               End of Session   Activity Tolerance: Patient tolerated treatment well Patient left: in chair;with call bell/phone within reach Nurse Communication: Mobility status         Time: 3818-2993 PT Time Calculation (min) (ACUTE ONLY): 29 min   Charges:   PT Evaluation $PT Eval Moderate Complexity: 1 Procedure PT Treatments $Therapeutic Activity: 8-22 mins   PT G Codes:        Marcelino Freestone PT [phone]  01/28/2016, 2:46 PM

## 2016-01-28 NOTE — Progress Notes (Signed)
1 Day Post-Op  Subjective: Pt alert, using PCA, she is pretty tender. BP is in 90's on [place] right now.    Objective: Vital signs in last 24 hours: Temp:  [98.2 F (36.8 C)-100.1 F (37.8 C)] 100.1 F (37.8 C) (02/09 0330) Pulse Rate:  [98-111] 111 (02/09 0600) Resp:  [12-23] 20 (02/09 0600) BP: (110-131)/(38-58) 112/42 mmHg (02/08 2230) SpO2:  [92 %-100 %] 100 % (02/09 0600) Arterial Line BP: (116-134)/(45-53) 116/46 mmHg (02/08 2215) Last BM Date: 01/27/16 9726 IV 3758 urine recorded 1250 blood recorded 235 from drains TM 100.1, tachycardia, 100 range BP currently in the 100 range Labs stable, glucose is up. Intake/Output from previous day: 02/08 0701 - 02/09 0700 In: 9726.3 [I.V.:8968.3; IV Piggyback:658] Out: 1860 [Urine:375; Drains:235; Blood:1250] Intake/Output this shift:    General appearance: alert, cooperative, no distress and really tender and having pain.  Using PCA. GI: tender, no BS, both ostomies look fine.  not much coming from either right now.    Lab Results:   Recent Labs  01/26/16 1356 01/27/16 2157 01/28/16 0320  WBC 6.4  --   --   HGB 14.5 9.3* 9.3*  HCT 44.8 28.0* 27.9*  PLT 172  --   --     BMET  Recent Labs  01/27/16 2157 01/28/16 0320  NA 135 137  K 5.0 4.7  CL 104 105  CO2 23 24  GLUCOSE 220* 258*  BUN 8 10  CREATININE 0.68 0.90  CALCIUM 7.4* 7.6*   PT/INR No results for input(s): LABPROT, INR in the last 72 hours.   Recent Labs Lab 01/26/16 1356  AST 31  ALT 29  ALKPHOS 58  BILITOT 1.0  PROT 6.6  ALBUMIN 4.4     Lipase  No results found for: LIPASE   Studies/Results: Dg Abd 1 View  01/27/2016  CLINICAL DATA:  BILATERAL double-J stent placement after cystectomy. EXAM: ABDOMEN - 1 VIEW COMPARISON:  None. FINDINGS: Cystectomy has been performed. A drain is in the pelvis. Ureteral catheters appear to terminate in the collecting system bilaterally. They appear to drain externally on the RIGHT. IMPRESSION: As above.  Electronically Signed   By: Staci Righter M.D.   On: 01/27/2016 22:26    Medications: . acetaminophen  1,000 mg Oral Q8H  . alvimopan  12 mg Oral BID  . Chlorhexidine Gluconate Cloth  6 each Topical Q0600  . clindamycin (CLEOCIN) IV  600 mg Intravenous Q8H  . gentamicin  5 mg/kg (Adjusted) Intravenous Q24H  . heparin subcutaneous  5,000 Units Subcutaneous Q8H  . HYDROmorphone   Intravenous 6 times per day  . lip balm  1 application Topical BID  . mupirocin ointment  1 application Nasal BID   . dextrose 5 % and 0.45 % NaCl with KCl 20 mEq/L 100 mL/hr at 01/28/16 0019   Prior to Admission medications   Medication Sig Start Date End Date Taking? Authorizing Provider  isoniazid (NYDRAZID) 300 MG tablet Take 300 mg by mouth daily.   Yes Historical Provider, MD  predniSONE (DELTASONE) 5 MG tablet Take 2.5-20 mg by mouth See admin instructions. Take 4 tablets daily for 2 days, then 2 tablets daily for 2 weeks, then 1 tablet daily for 2 weeks, then 0.5 tablet daily for 2 weeks   Yes Historical Provider, MD    Assessment/Plan Refractory high-grade superficial bladder cancer  Cystoscopy, Robotic converted to open radical cystectomy with pelvic exenteration including hysterectomy with bilateral salpingo-oophorectomy, total urethrectomy, bilateral pelvic lymphadenectomy. Colon conduit  urinary diversion. Extensive laparoscopic adhesiolysis. Partial colectomy with end colostomy as performed 01/27/16, Dr. Alexis Frock, Dr. Fanny Skates, and DR. Michael Boston Prior history of colon cancer with resection, anastomosis and chemotherapy. On isolation for MRSA. Antibiotics:  Clindamycin/Gentamicin DVT:  Heparin/SCD    Plan:  Continue current supportive care.  Looks pretty good for POD 1.   LOS: 2 days    [name],[name] 01/28/2016  Agree with above. Her BP is a little soft.  Her labs look good.  Will give 500 cc bolus of NS.  Alphonsa Overall, MD, [facility] Pager:  [phone] Office phone:  [phone]

## 2016-01-28 NOTE — Consult Note (Addendum)
WOC ostomy consult note: Urostomy pouch is leaking upon my arrival and has saturated the midline honeycomb dressing placed in surgery.  Both pouches removed for assessment and midline dressing replaced with gauze.  Stoma type/location: RUQ ileal conduit (urostomy) Stomal assessment/size: 1 and 3/4 inch, budded, red, moist, edematous with two stents intact.  Red = right and Blue = left Peristomal assessment: intact, clear. Defect noted at 7 o'clock Treatment options for stomal/peristomal skin: skin barrier ring Output blood-tinged urine, clear Ostomy pouching: 1pc.pouch with convexity and skin barrier ring Education provided: None today except to tell patient that I have known many other patients with both a colostomy and a urostomy and that it is possible to manage with both. She is tearful today in ICU. Enrolled patient in [place] program: No   WOC ostomy consult note Stoma type/location: LUQ colostomy Stomal assessment/size: 1 and 3/4 inches, 25% red, 75% with outer layer of mucosa evident Peristomal assessment: intact, clear Treatment options for stomal/peristomal skin: Karaya pouch with mild convexity applied Output None Ostomy pouching: 1pc.Karaya pouch  Education provided: None today except see above-comforting patient who now has two stomas. Enrolled patient in [place] program: No   WOC nursing team will follow, and will remain available to this patient, the nursing, surgical and medical teams.   Thanks, Maudie Flakes, MSN, RN, [place], Arther Abbott  Pager# [phone]

## 2016-01-28 NOTE — Progress Notes (Signed)
Pharmacy: Re- gentamicin  Patient's a 70 y.o F with bladder cancer s/p open radical cystectomy + TAH/BSO + total uretrhectomy + pelvic node dissection + colon conduit and end colostomy 01/27/16. Extended interval Gentamicin 5 mg/kg/day dose given at ~1100 today.  Level drawn ~8.5 hrs after dose now back <0.5.    Plan: - Since patient is only on gentamicin for 2 more days, will continue with current dose for now.  Dia Sitter, PharmD, BCPS 01/28/2016 10:19 PM

## 2016-01-28 NOTE — Progress Notes (Signed)
1 Day Post-Op  Subjective:   1 - High Grade Bladder Cancer  - s/p open radical cystectomy + TAH/BSO + total uretrhectomy + pelvic node dissection + colon conduit and end colostomy 01/27/16, path pending. Stepdown 2/8.   2 - Extensive Abdominal Adhesions, H/o Colon Cancer - concomitant open segmetnal colon resection and end colostomy 01/27/16 for extensive colonic adhesions adherent to left ureter / bladder at time of cystectomy 01/27/16. Initially NPO / ice chips following surgery.   3 - Disposition  - pt independent in all ADL's at baseline. PT consult pending.   Today "[name]" is stable. Pain controlled on PCA. Stents in good position by KUB.   Objective: Vital signs in last 24 hours: Temp:  [98.2 F (36.8 C)-100.1 F (37.8 C)] 100.1 F (37.8 C) (02/09 0330) Pulse Rate:  [98-111] 111 (02/09 0600) Resp:  [12-23] 20 (02/09 0600) BP: (110-131)/(38-58) 112/42 mmHg (02/08 2230) SpO2:  [92 %-100 %] 100 % (02/09 0600) Arterial Line BP: (116-134)/(45-53) 116/46 mmHg (02/08 2215) Last BM Date: 01/27/16  Intake/Output from previous day: 02/08 0701 - 02/09 0700 In: 9726.3 [I.V.:8968.3; IV Piggyback:658] Out: 1860 [Urine:375; Drains:235; Blood:1250] Intake/Output this shift:    General appearance: alert, cooperative and appears stated age Eyes: negative Nose: Nares normal. Septum midline. Mucosa normal. No drainage or sinus tenderness., [place] O2 Throat: lips, mucosa, and tongue normal; teeth and gums normal Neck: supple, symmetrical, trachea midline Back: symmetric, no curvature. ROM normal. No CVA tenderness. Resp: non-labored on [place] O2 Cardio: regular tachycardia by bedside monitor GI: soft, non-tender; bowel sounds normal; no masses,  no organomegaly Pelvic: external genitalia normal and mild spotting on pad Extremities: extremities normal, atraumatic, no cyanosis or edema Pulses: 2+ and symmetric Lymph nodes: Cervical, supraclavicular, and axillary nodes normal. Neurologic: Grossly  normal Incision/Wound: midlinne incision c/d/i. RLQ colon conduit with dark pink thin urien and distal stents in situe. LLQ colostomy pink, no flatus / stool in bag.   Lab Results:   Recent Labs  01/26/16 1356 01/27/16 2157 01/28/16 0320  WBC 6.4  --   --   HGB 14.5 9.3* 9.3*  HCT 44.8 28.0* 27.9*  PLT 172  --   --    BMET  Recent Labs  01/27/16 2157 01/28/16 0320  NA 135 137  K 5.0 4.7  CL 104 105  CO2 23 24  GLUCOSE 220* 258*  BUN 8 10  CREATININE 0.68 0.90  CALCIUM 7.4* 7.6*   PT/INR No results for input(s): LABPROT, INR in the last 72 hours. ABG No results for input(s): PHART, HCO3 in the last 72 hours.  Invalid input(s): PCO2, PO2  Studies/Results: Dg Abd 1 View  01/27/2016  CLINICAL DATA:  BILATERAL double-J stent placement after cystectomy. EXAM: ABDOMEN - 1 VIEW COMPARISON:  None. FINDINGS: Cystectomy has been performed. A drain is in the pelvis. Ureteral catheters appear to terminate in the collecting system bilaterally. They appear to drain externally on the RIGHT. IMPRESSION: As above. Electronically Signed   By: Staci Righter M.D.   On: 01/27/2016 22:26    Anti-infectives: Anti-infectives    Start     Dose/Rate Route Frequency Ordered Stop   01/28/16 1100  gentamicin (GARAMYCIN) 320 mg in dextrose 5 % 100 mL IVPB     5 mg/kg  63 kg (Adjusted) 108 mL/hr over 60 Minutes Intravenous Every 24 hours 01/28/16 0604 01/31/16 1059   01/27/16 2359  clindamycin (CLEOCIN) IVPB 600 mg     600 mg 100 mL/hr over 30 Minutes  Intravenous Every 8 hours 01/27/16 2253 01/30/16 2359   01/27/16 0600  clindamycin (CLEOCIN) IVPB 900 mg     900 mg 100 mL/hr over 30 Minutes Intravenous 30 min pre-op 01/26/16 1329 01/27/16 1700   01/26/16 1613  gentamicin (GARAMYCIN) 320 mg in dextrose 5 % 100 mL IVPB     5 mg/kg  63 kg (Adjusted) 216 mL/hr over 30 Minutes Intravenous 30 min pre-op 01/26/16 1613 01/27/16 1100   01/26/16 1254  clindamycin (CLEOCIN) IVPB 900 mg  Status:   Discontinued     900 mg 100 mL/hr over 30 Minutes Intravenous 30 min pre-op 01/26/16 1254 01/26/16 1329   01/26/16 1254  gentamicin (GARAMYCIN) IVPB 80 mg  Status:  Discontinued     80 mg 100 mL/hr over 30 Minutes Intravenous 30 min pre-op 01/26/16 1254 01/26/16 1327      Assessment/Plan:  1 - High Grade Bladder Cancer  - path pending. Doing well POD 1.   2 - Extensive Abdominal Adhesions, H/o Colon Cancer - ice chips today. Will start clears tomorrow as long as no s/s ileus, which she is at substantial risk for.   3 - Disposition  - remain stepdown today, OOB2C / PT consult. Continue PCA, IVF, current peri-op ABX. Begin heparin prophas Hgb stable.    [facility], Joziah Dollins 01/28/2016

## 2016-01-29 LAB — POCT I-STAT 7, (LYTES, BLD GAS, ICA,H+H)
ACID-BASE DEFICIT: 1 mmol/L (ref 0.0–2.0)
Acid-base deficit: 1 mmol/L (ref 0.0–2.0)
BICARBONATE: 25 meq/L — AB (ref 20.0–24.0)
BICARBONATE: 24.9 meq/L — AB (ref 20.0–24.0)
CALCIUM ION: 1.1 mmol/L — AB (ref 1.13–1.30)
CALCIUM ION: 1.05 mmol/L — AB (ref 1.13–1.30)
HCT: 36 % (ref 36.0–46.0)
HCT: 27 % — ABNORMAL LOW (ref 36.0–46.0)
HEMOGLOBIN: 9.2 g/dL — AB (ref 12.0–15.0)
Hemoglobin: 12.2 g/dL (ref 12.0–15.0)
O2 SAT: 100 %
O2 Saturation: 100 %
PH ART: 7.31 — AB (ref 7.350–7.450)
PO2 ART: 190 mmHg — AB (ref 80.0–100.0)
Potassium: 4.1 mmol/L (ref 3.5–5.1)
Potassium: 4.8 mmol/L (ref 3.5–5.1)
SODIUM: 134 mmol/L — AB (ref 135–145)
SODIUM: 135 mmol/L (ref 135–145)
TCO2: 26 mmol/L (ref 0–100)
TCO2: 26 mmol/L (ref 0–100)
pCO2 arterial: 47.1 mmHg — ABNORMAL HIGH (ref 35.0–45.0)
pCO2 arterial: 49.9 mmHg — ABNORMAL HIGH (ref 35.0–45.0)
pH, Arterial: 7.334 — ABNORMAL LOW (ref 7.350–7.450)
pO2, Arterial: 206 mmHg — ABNORMAL HIGH (ref 80.0–100.0)

## 2016-01-29 LAB — BASIC METABOLIC PANEL
ANION GAP: 7 (ref 5–15)
BUN: 10 mg/dL (ref 6–20)
CALCIUM: 7.4 mg/dL — AB (ref 8.9–10.3)
CHLORIDE: 106 mmol/L (ref 101–111)
CO2: 23 mmol/L (ref 22–32)
Creatinine, Ser: 0.75 mg/dL (ref 0.44–1.00)
GFR calc non Af Amer: >60 mL/min (ref >60)
Glucose, Bld: 186 mg/dL — ABNORMAL HIGH (ref 65–99)
Potassium: 4.4 mmol/L (ref 3.5–5.1)
SODIUM: 136 mmol/L (ref 135–145)

## 2016-01-29 LAB — PREPARE RBC (CROSSMATCH)

## 2016-01-29 LAB — HEMOGLOBIN AND HEMATOCRIT, BLOOD
HCT: 22 % — ABNORMAL LOW (ref 36.0–46.0)
Hemoglobin: 7.3 g/dL — ABNORMAL LOW (ref 12.0–15.0)

## 2016-01-29 MED ORDER — CETYLPYRIDINIUM CHLORIDE 0.05 % MT LIQD
7.0000 mL | Freq: Two times a day (BID) | OROMUCOSAL | Status: DC
  Administered 2016-01-30 – 2016-02-04 (×9): 7 mL via OROMUCOSAL

## 2016-01-29 MED ORDER — CHLORHEXIDINE GLUCONATE 0.12 % MT SOLN
15.0000 mL | Freq: Two times a day (BID) | OROMUCOSAL | Status: DC
  Administered 2016-01-29 – 2016-02-05 (×14): 15 mL via OROMUCOSAL
  Filled 2016-01-29 (×13): qty 15

## 2016-01-29 MED ORDER — SODIUM CHLORIDE 0.9 % IV SOLN
Freq: Once | INTRAVENOUS | Status: AC
  Administered 2016-01-29: 10:00:00 via INTRAVENOUS

## 2016-01-29 NOTE — Progress Notes (Signed)
   01/29/16 1500  Clinical Encounter Type  Visited With Patient and family together  Visit Type Initial;Spiritual support;Psychological support;Post-op;Critical Care  Referral From Nurse  Consult/Referral To Chaplain;Nurse  Spiritual Encounters  Spiritual Needs Emotional  Stress Factors  Patient Stress Factors Major life changes   Chaplain referred by nursing.  [name] was lethargic during chaplain visit.  Opened eyes and communicating verbally.  Husband at bedside.  Chaplain introduced service, provided emotional support around recent surgery.    As [name] was drifting in and out of sleep during visit, chaplain will return for follow up and continued support.   Supported by spouse, daughter, mother, son.

## 2016-01-29 NOTE — Progress Notes (Signed)
2 Days Post-Op  Subjective: She looks just worn out this AM, very weak.  Getting transfused now.    Objective: Vital signs in last 24 hours: Temp:  [97.8 F (36.6 C)-99 F (37.2 C)] 98.3 F (36.8 C) (02/10 1021) Pulse Rate:  [83-118] 117 (02/10 1022) Resp:  [8-28] 19 (02/10 1022) BP: (86-138)/(30-62) 104/30 mmHg (02/10 1022) SpO2:  [95 %-100 %] 97 % (02/10 1022) Last BM Date: 01/26/16 Drains 300, urine 1900 Ice chips 100 Afebrile, VSS she stay tachycardic 7.3/22  H/H  BMP OK Intake/Output from previous day: 02/09 0701 - 02/10 0700 In: 2650 [P.O.:100; I.V.:1500; IV Piggyback:1050] Out: 2200 [Urine:1900; Drains:300] Intake/Output this shift: Total I/O In: 30 [Blood:30] Out: -   General appearance: alert and very tired and lethargic she has pain with most movement. GI: Midline incision is fine.  she does not have much of anything in the way of ostomy output currently.  The colosotmy has some dark fluid coming from it but not much else.  The ostomy itsself is a little dusky in the mid portion.    Lab Results:   Recent Labs  01/26/16 1356  01/28/16 0320 01/29/16 0430  WBC 6.4  --   --   --   HGB 14.5  < > 9.3* 7.3*  HCT 44.8  < > 27.9* 22.0*  PLT 172  --   --   --   < > = values in this interval not displayed.  BMET  Recent Labs  01/28/16 0320 01/29/16 0430  NA 137 136  K 4.7 4.4  CL 105 106  CO2 24 23  GLUCOSE 258* 186*  BUN 10 10  CREATININE 0.90 0.75  CALCIUM 7.6* 7.4*   PT/INR No results for input(s): LABPROT, INR in the last 72 hours.   Recent Labs Lab 01/26/16 1356  AST 31  ALT 29  ALKPHOS 58  BILITOT 1.0  PROT 6.6  ALBUMIN 4.4     Lipase  No results found for: LIPASE   Studies/Results: Dg Abd 1 View  01/27/2016  CLINICAL DATA:  BILATERAL double-J stent placement after cystectomy. EXAM: ABDOMEN - 1 VIEW COMPARISON:  None. FINDINGS: Cystectomy has been performed. A drain is in the pelvis. Ureteral catheters appear to terminate in the  collecting system bilaterally. They appear to drain externally on the RIGHT. IMPRESSION: As above. Electronically Signed   By: Staci Righter M.D.   On: 01/27/2016 22:26    Medications: . acetaminophen  1,000 mg Oral Q8H  . alvimopan  12 mg Oral BID  . Chlorhexidine Gluconate Cloth  6 each Topical Q0600  . clindamycin (CLEOCIN) IV  600 mg Intravenous Q8H  . gentamicin  5 mg/kg (Adjusted) Intravenous Q24H  . HYDROmorphone   Intravenous 6 times per day  . lip balm  1 application Topical BID  . mupirocin ointment  1 application Nasal BID    Assessment/Plan Refractory high-grade superficial bladder cancer Cystoscopy, Robotic converted to open radical cystectomy with pelvic exenteration including hysterectomy with bilateral salpingo-oophorectomy, total urethrectomy, bilateral pelvic lymphadenectomy. Colon conduit urinary diversion. Extensive laparoscopic adhesiolysis. Partial colectomy with end colostomy as performed 01/27/16, Dr. Alexis Frock, Dr. Fanny Skates, and Dr. Michael Boston  Prior history of colon cancer with resection, anastomosis and chemotherapy. On isolation for MRSA. Antibiotics: Clindamycin/Gentamicin day 3 DVT: Heparin/SCD   Plan:  Continue supportive care, NPO, watch ostomy, and await bowel function return.     LOS: 3 days    JENNINGS,WILLARD 01/29/2016  Agree with above.  Alphonsa Overall, MD, [facility] Surgery Pager: [phone] Office phone:  [phone]

## 2016-01-29 NOTE — Progress Notes (Signed)
2 Days Post-Op  Subjective:   1 - High Grade Bladder Cancer  - s/p open radical cystectomy + TAH/BSO + total uretrhectomy + pelvic node dissection + colon conduit and end colostomy 01/27/16, path pending. Stepdown 2/8.   2 - Extensive Abdominal Adhesions, H/o Colon Cancer - concomitant open segmetnal colon resection and end colostomy 01/27/16 for extensive colonic adhesions adherent to left ureter / bladder at time of cystectomy 01/27/16. Initially NPO / ice chips following surgery.   3 - Acute Blood Loss Anemia - approx 1000 EBL surgery 2/8. Hgb initially stable at 9.3 and started SQ heparin proph. Hgb drop to 7.3 noted 2/10 and with tachycardia.   4 - Disposition  - pt independent in all ADL's at baseline. PT consult pending.   Today "[name]" is stable. Pain controlled on PCA. Anemia with tachycardia noted this AM.   Objective: Vital signs in last 24 hours: Temp:  [97.8 F (36.6 C)-99 F (37.2 C)] 98.5 F (36.9 C) (02/10 0358) Pulse Rate:  [83-116] 116 (02/10 0700) Resp:  [8-28] 16 (02/10 0700) BP: (86-138)/(33-62) 124/41 mmHg (02/10 0700) SpO2:  [95 %-100 %] 96 % (02/10 0700) Last BM Date: 01/26/16  Intake/Output from previous day: 02/09 0701 - 02/10 0700 In: 2650 [P.O.:100; I.V.:1500; IV Piggyback:1050] Out: 2200 [Urine:1900; Drains:300] Intake/Output this shift:    General appearance: alert, cooperative and appears stated age Eyes: negative Nose: Nares normal. Septum midline. Mucosa normal. No drainage or sinus tenderness., On [place] O2 Throat: lips, mucosa, and tongue normal; teeth and gums normal Neck: supple, symmetrical, trachea midline Back: symmetric, no curvature. ROM normal. No CVA tenderness. Resp: non-labored on [place] O2 Cardio: regular tachycardia by bedisde monitor.  GI: soft, non-tender; bowel sounds normal; no masses,  no organomegaly Extremities: extremities normal, atraumatic, no cyanosis or edema Pulses: 2+ and symmetric Skin: Skin color, texture, turgor normal. No  rashes or lesions Lymph nodes: Cervical, supraclavicular, and axillary nodes normal. Neurologic: Grossly normal Incision/Wound:  Midline incision c/d/i with staples and dry dresing. RLQ Urostomy pink / patent with bander stents and copious light pink urine. LLQ end colostomy pinnk w/o gas / stool in appliance.  Lab Results:   Recent Labs  01/26/16 1356  01/28/16 0320 01/29/16 0430  WBC 6.4  --   --   --   HGB 14.5  < > 9.3* 7.3*  HCT 44.8  < > 27.9* 22.0*  PLT 172  --   --   --   < > = values in this interval not displayed. BMET  Recent Labs  01/28/16 0320 01/29/16 0430  NA 137 136  K 4.7 4.4  CL 105 106  CO2 24 23  GLUCOSE 258* 186*  BUN 10 10  CREATININE 0.90 0.75  CALCIUM 7.6* 7.4*   PT/INR No results for input(s): LABPROT, INR in the last 72 hours. ABG No results for input(s): PHART, HCO3 in the last 72 hours.  Invalid input(s): PCO2, PO2  Studies/Results: Dg Abd 1 View  01/27/2016  CLINICAL DATA:  BILATERAL double-J stent placement after cystectomy. EXAM: ABDOMEN - 1 VIEW COMPARISON:  None. FINDINGS: Cystectomy has been performed. A drain is in the pelvis. Ureteral catheters appear to terminate in the collecting system bilaterally. They appear to drain externally on the RIGHT. IMPRESSION: As above. Electronically Signed   By: Staci Righter M.D.   On: 01/27/2016 22:26    Anti-infectives: Anti-infectives    Start     Dose/Rate Route Frequency Ordered Stop   01/28/16 1100  gentamicin (GARAMYCIN) 320 mg in dextrose 5 % 100 mL IVPB     5 mg/kg  63 kg (Adjusted) 108 mL/hr over 60 Minutes Intravenous Every 24 hours 01/28/16 0604 01/31/16 1059   01/27/16 2359  clindamycin (CLEOCIN) IVPB 600 mg     600 mg 100 mL/hr over 30 Minutes Intravenous Every 8 hours 01/27/16 2253 01/30/16 2359   01/27/16 0600  clindamycin (CLEOCIN) IVPB 900 mg     900 mg 100 mL/hr over 30 Minutes Intravenous 30 min pre-op 01/26/16 1329 01/27/16 1700   01/26/16 1613  gentamicin (GARAMYCIN)  320 mg in dextrose 5 % 100 mL IVPB     5 mg/kg  63 kg (Adjusted) 216 mL/hr over 30 Minutes Intravenous 30 min pre-op 01/26/16 1613 01/27/16 1100   01/26/16 1254  clindamycin (CLEOCIN) IVPB 900 mg  Status:  Discontinued     900 mg 100 mL/hr over 30 Minutes Intravenous 30 min pre-op 01/26/16 1254 01/26/16 1329   01/26/16 1254  gentamicin (GARAMYCIN) IVPB 80 mg  Status:  Discontinued     80 mg 100 mL/hr over 30 Minutes Intravenous 30 min pre-op 01/26/16 1254 01/26/16 1327      Assessment/Plan:  1 - High Grade Bladder Cancer  - path pending.   2 - Extensive Abdominal Adhesions, H/o Colon Cancer - continue ice chips today. Will start clears soon as long as no s/s ileus, which she is at substantial risk for.   3 - Acute Blood Loss Anemia - 2upRBC today, stop proph heparin. JP output scant and wound w/o drainage, most likely equilibration following surgery.   4 - Disposition  - remain ICU.   [facility], [name] 01/29/2016

## 2016-01-29 NOTE — Progress Notes (Signed)
Inpatient Diabetes Program Recommendations  AACE/ADA: New Consensus Statement on Inpatient Glycemic Control (2015)  Target Ranges:  Prepandial:   less than 140 mg/dL      Peak postprandial:   less than 180 mg/dL (1-2 hours)      Critically ill patients:  140 - 180 mg/dL   Results for [name], [name] (MRN [id number]) as of 01/29/2016 09:07  Ref. Range 01/27/2016 21:57 01/28/2016 03:20 01/29/2016 04:30  Glucose Latest Ref Range: 65-99 mg/dL 220 (H) 258 (H) 186 (H)    Admit with: Cystectomy / Hysterectomy / Ileal Conduit Urinary Diversion    MD- Patient with elevated lab glucose levels X 3 days now.    Please consider the following while patient hospitalized:  1. Place order for CBG checks Q4 hours  2. Start Novolog Sensitive SSI (0-9 units) Q4 hours if CBGs elevated  3. Consider checking Hemoglobin A1c level     --Will follow patient during hospitalization--  Wyn Quaker RN, MSN, CDE Diabetes Coordinator Inpatient Glycemic Control Team Team Pager: [phone] (8a-5p)

## 2016-01-30 LAB — BASIC METABOLIC PANEL
ANION GAP: 6 (ref 5–15)
BUN: 11 mg/dL (ref 6–20)
CALCIUM: 7.5 mg/dL — AB (ref 8.9–10.3)
CO2: 24 mmol/L (ref 22–32)
CREATININE: 1.1 mg/dL — AB (ref 0.44–1.00)
Chloride: 106 mmol/L (ref 101–111)
GFR, EST AFRICAN AMERICAN: 58 mL/min — AB (ref >60)
GFR, EST NON AFRICAN AMERICAN: 50 mL/min — AB (ref >60)
Glucose, Bld: 154 mg/dL — ABNORMAL HIGH (ref 65–99)
Potassium: 4.5 mmol/L (ref 3.5–5.1)
SODIUM: 136 mmol/L (ref 135–145)

## 2016-01-30 LAB — GLUCOSE, CAPILLARY
GLUCOSE-CAPILLARY: 130 mg/dL — AB (ref 65–99)
Glucose-Capillary: 118 mg/dL — ABNORMAL HIGH (ref 65–99)

## 2016-01-30 LAB — HEMOGLOBIN AND HEMATOCRIT, BLOOD
HCT: 27.4 % — ABNORMAL LOW (ref 36.0–46.0)
HEMOGLOBIN: 9.3 g/dL — AB (ref 12.0–15.0)

## 2016-01-30 MED ORDER — INSULIN ASPART 100 UNIT/ML ~~LOC~~ SOLN
0.0000 [IU] | Freq: Three times a day (TID) | SUBCUTANEOUS | Status: DC
  Administered 2016-01-30 – 2016-02-04 (×6): 1 [IU] via SUBCUTANEOUS

## 2016-01-30 MED ORDER — ACETAMINOPHEN 500 MG PO TABS
1000.0000 mg | ORAL_TABLET | Freq: Three times a day (TID) | ORAL | Status: DC
Start: 2016-01-30 — End: 2016-02-03
  Administered 2016-01-30 – 2016-02-02 (×11): 1000 mg via ORAL
  Filled 2016-01-30 (×13): qty 2

## 2016-01-30 MED ORDER — METHOCARBAMOL 1000 MG/10ML IJ SOLN
1000.0000 mg | Freq: Four times a day (QID) | INTRAVENOUS | Status: DC | PRN
  Filled 2016-01-30: qty 10

## 2016-01-30 MED ORDER — INSULIN ASPART 100 UNIT/ML ~~LOC~~ SOLN: 0.0000 [IU] | Freq: Every day | SUBCUTANEOUS | Status: DC

## 2016-01-30 NOTE — Progress Notes (Signed)
3 Days Post-Op  Subjective:   1 - High Grade Bladder Cancer  - s/p open radical cystectomy + TAH/BSO + total uretrhectomy + pelvic node dissection + colon conduit and end colostomy 01/27/16, path pending. Stepdown 2/8.   2 - Extensive Abdominal Adhesions, H/o Colon Cancer - concomitant open segmetnal colon resection and end colostomy 01/27/16 for extensive colonic adhesions adherent to left ureter / bladder at time of cystectomy 01/27/16. Initially NPO / ice chips following surgery. Clears started 2/11 at good bowel sounds and no emesis.   3 - Acute Blood Loss Anemia - approx 1000 EBL surgery 2/8. Hgb initially stable at 9.3 and started SQ heparin proph. Hgb drop to 7.3 noted 2/10 and with tachycardia. Transfused 2uPRBC 01/29/16 with appropriate response back up to Hb of 9.3.  4 - Disposition  - pt independent in all ADL's at baseline. PT working with patient, rec rolling walker and OT consult. Rec 3x/week evaluation.  Today "[name]" is stable. Pain controlled on PCA.Transfused 2uPRBC with appropriate response.  Objective: Vital signs in last 24 hours: Temp:  [97.8 F (36.6 C)-98.7 F (37.1 C)] 98.4 F (36.9 C) (02/11 0400) Pulse Rate:  [83-118] 87 (02/11 0600) Resp:  [10-29] 22 (02/11 0600) BP: (94-130)/(30-56) 106/42 mmHg (02/11 0600) SpO2:  [96 %-99 %] 97 % (02/11 0600) Last BM Date: 01/26/16  Intake/Output from previous day: 02/10 0701 - 02/11 0700 In: 2635 [I.V.:2000; Blood:635] Out: 2190 [Urine:2050; Drains:140] Intake/Output this shift:    General appearance: alert, cooperative and appears stated age Eyes: negative Nose: Nares normal. Septum midline. Mucosa normal. No drainage or sinus tenderness., On [place] O2 Throat: lips, mucosa, and tongue normal; teeth and gums normal Neck: supple, symmetrical, trachea midline Back: symmetric, no curvature. ROM normal. No CVA tenderness. Resp: non-labored on [place] O2 Cardio: regular tachycardia by bedisde monitor.  GI: soft, non-tender; bowel  sounds normal; no masses,  no organomegaly Extremities: extremities normal, atraumatic, no cyanosis or edema Pulses: 2+ and symmetric Skin: Skin color, texture, turgor normal. No rashes or lesions Lymph nodes: Cervical, supraclavicular, and axillary nodes normal. Neurologic: Grossly normal Incision/Wound:  Midline incision c/d/i with staples and dry dresing. RLQ Urostomy pink / patent with bander stents and copious light pink urine. LLQ end colostomy pinnk w/o gas / stool in appliance.  Lab Results:   Recent Labs  01/29/16 0430 01/30/16 0455  HGB 7.3* 9.3*  HCT 22.0* 27.4*   BMET  Recent Labs  01/29/16 0430 01/30/16 0455  NA 136 136  K 4.4 4.5  CL 106 106  CO2 23 24  GLUCOSE 186* 154*  BUN 10 11  CREATININE 0.75 1.10*  CALCIUM 7.4* 7.5*   PT/INR No results for input(s): LABPROT, INR in the last 72 hours. ABG  Recent Labs  01/27/16 1640 01/27/16 1935  PHART 7.334* 7.310*  HCO3 25.0* 24.9*    Studies/Results: No results found.  Anti-infectives: Anti-infectives    Start     Dose/Rate Route Frequency Ordered Stop   01/28/16 1100  gentamicin (GARAMYCIN) 320 mg in dextrose 5 % 100 mL IVPB     5 mg/kg  63 kg (Adjusted) 108 mL/hr over 60 Minutes Intravenous Every 24 hours 01/28/16 0604 01/31/16 1059   01/27/16 2359  clindamycin (CLEOCIN) IVPB 600 mg     600 mg 100 mL/hr over 30 Minutes Intravenous Every 8 hours 01/27/16 2253 01/30/16 2359   01/27/16 0600  clindamycin (CLEOCIN) IVPB 900 mg     900 mg 100 mL/hr over 30 Minutes Intravenous  30 min pre-op 01/26/16 1329 01/27/16 1700   01/26/16 1613  gentamicin (GARAMYCIN) 320 mg in dextrose 5 % 100 mL IVPB     5 mg/kg  63 kg (Adjusted) 216 mL/hr over 30 Minutes Intravenous 30 min pre-op 01/26/16 1613 01/27/16 1100   01/26/16 1254  clindamycin (CLEOCIN) IVPB 900 mg  Status:  Discontinued     900 mg 100 mL/hr over 30 Minutes Intravenous 30 min pre-op 01/26/16 1254 01/26/16 1329   01/26/16 1254  gentamicin  (GARAMYCIN) IVPB 80 mg  Status:  Discontinued     80 mg 100 mL/hr over 30 Minutes Intravenous 30 min pre-op 01/26/16 1254 01/26/16 1327      Assessment/Plan:  1 - High Grade Bladder Cancer  - path pending.   2 - Extensive Abdominal Adhesions, H/o Colon Cancer - start clears today.   3 - Acute Blood Loss Anemia -appropriate response to [facility] 01/29/16, will consider restarting proph heparin. JP output remains scant and wound w/o drainage, most likely equilibration following surgery.   4 - Disposition  - transfer to monitored med-surg bed.   Star Age 01/30/2016  I have seen and examined the patient and agree with assesment and plan.  Briefly,  S: POD 3 s/p cystectomy + colostomy. Pain controlled.   O: NAD, drowsy with PCA RRR Non-labored breathing urosotmy pink / patent with copious urine. Colostomy pink with no gas/stool. Excellent bowel sounds Midline incision c/d/i with staples, JP scant output Small vaginal pad spotting SCD's in place  A/P - adv to clears, transfer to monitored floor bed, encouraged OOB and ambulation.

## 2016-01-30 NOTE — Progress Notes (Signed)
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  PROCEDURES: 1. Cystoscopy with indocyanine green dye injection for sentinel  lymphangiography. 2. Robotic converted to open radical cystectomy with pelvic  exenteration including hysterectomy with bilateral salpingo-  oophorectomy, total urethrectomy, bilateral pelvic lymphadenectomy. 3. Colon conduit urinary diversion. 4. Extensive laparoscopic adhesiolysis. 5. Partial colectomy with end colostomy as performed by General  Surgery team, Dr. Dalbert Batman and Dr. Johney Maine as per separate dictated  operative note.      Plan:   Prior history of colon cancer with resection, anastomosis and chemotherapy.  On isolation for MRSA.  Antibiotics: Clindamycin/Gentamicin day 3/3 - stopping today  NPO  Colostomy care  Urostomy care  Try PO gently.  Continue entereg.  Await bowel function return.   -follow Hgb s/p transfusion  -?DM with glc>200.  Check HgbA1C, sensitive SSI.  (If glc<140 x 48hrs, then stop)  -VTE prophylaxis- SCDs, etc  -mobilize as tolerated to help recovery  -f/u path  I updated the patient's status to the patient, her RN,  & the Urology team (Drs [facility] & Ottis Stain).  Recommendations were made.  Questions were answered.    They expressed understanding & appreciation.    Adin Hector, M.D., F.A.C.S. Gastrointestinal and Minimally Invasive Surgery [facility] [address] [place], [place] [address] [phone] Main / Paging   01/30/2016  Subjective:  Sore - PCA helps Mild belching but no nausea RN in room  Objective:  Vital signs:  Filed Vitals:   01/30/16 0600 01/30/16 0700 01/30/16 0800 01/30/16 0840  BP: 106/42 107/44    Pulse: 87 87    Temp:   98.7 F (37.1 C)   TempSrc:   Oral   Resp: 22 10  10   Height:      Weight:      SpO2: 97% 98%  98%    Last BM Date: 01/26/16  Intake/Output   Yesterday:  02/10 0701 - 02/11 0700 In: 3760 [I.V.:3125; Blood:635] Out: 2190 [Urine:2050; Drains:140] This shift:     Bowel function:  Flatus: n  BM: n  Drain: serosanguinous  Physical Exam:  General: Pt awake/alert/oriented x4 in no acute distress.  Tired but not toxic Eyes: PERRL, normal EOM.  Sclera clear.  No icterus Neuro: CN II-XII intact w/o focal sensory/motor deficits. Lymph: No head/neck/groin lymphadenopathy Psych:  No delerium/psychosis/paranoia HENT: Normocephalic, Mucus membranes moist.  No thrush Neck: Supple, No tracheal deviation Chest: No chest wall pain w good excursion CV:  Pulses intact.  Regular rhythm MS: Normal AROM mjr joints.  No obvious deformity Abdomen: Somewhat soft.  Obese.  Mildly distended.  Mildly tender at incisions only.  Incision c./d/i. LUQ urostomy mildly dusky w serosang urine.  LUQ colostomy ducsky w/o gas/stoolNo evidence of peritonitis.  No incarcerated hernias. Ext:  SCDs BLE.  No mjr edema.  No cyanosis Skin: No petechiae / purpura  Results:   Labs: Results for orders placed or performed during the hospital encounter of 01/26/16 (from the past 48 hour(s))  Gentamicin level, random  Status: None   Collection Time: 01/28/16  6:26 PM  Result Value Ref Range   Gentamicin Rm <0.5 ug/mL    Comment: REPEATED TO VERIFY RESULTS CONFIRMED BY MANUAL DILUTION        Random Gentamicin therapeutic range is dependent on dosage and time of specimen collection. A peak range is 5.0-10.0  ug/mL A trough range is 0.5-2.0 ug/mL        Performed at [facility]   Hemoglobin and hematocrit, blood     Status: Abnormal   Collection Time: 01/29/16  4:30 AM  Result Value Ref Range   Hemoglobin 7.3 (L) 12.0 - 15.0 g/dL    Comment: DELTA CHECK NOTED REPEATED TO VERIFY    HCT 22.0 (L) 36.0 - 78.9 %  Basic metabolic panel     Status: Abnormal   Collection Time: 01/29/16  4:30 AM  Result Value Ref Range   Sodium 136 135 - 145 mmol/L   Potassium 4.4 3.5 - 5.1 mmol/L   Chloride 106 101 - 111 mmol/L   CO2 23 22 - 32 mmol/L   Glucose, Bld 186 (H) 65 - 99 mg/dL   BUN 10 6 - 20 mg/dL   Creatinine, Ser 0.75 0.44 - 1.00 mg/dL   Calcium 7.4 (L) 8.9 - 10.3 mg/dL   GFR calc non Af Amer >60 >60 mL/min   GFR calc Af Amer >60 >60 mL/min    Comment: (NOTE) The eGFR has been calculated using the CKD EPI equation. This calculation has not been validated in all clinical situations. eGFR's persistently <60 mL/min signify possible Chronic Kidney Disease.    Anion gap 7 5 - 15  Prepare RBC     Status: None   Collection Time: 01/29/16  7:30 AM  Result Value Ref Range   Order Confirmation ORDER PROCESSED BY BLOOD BANK   Hemoglobin and hematocrit, blood     Status: Abnormal   Collection Time: 01/30/16  4:55 AM  Result Value Ref Range   Hemoglobin 9.3 (L) 12.0 - 15.0 g/dL    Comment: DELTA CHECK NOTED REPEATED TO VERIFY    HCT 27.4 (L) 36.0 - 38.1 %  Basic metabolic panel     Status: Abnormal   Collection Time: 01/30/16  4:55 AM  Result Value Ref Range   Sodium 136 135 - 145 mmol/L   Potassium 4.5 3.5 - 5.1 mmol/L   Chloride 106 101 - 111 mmol/L   CO2 24 22 - 32 mmol/L   Glucose, Bld 154 (H) 65 - 99 mg/dL   BUN 11 6 - 20 mg/dL   Creatinine, Ser 1.10 (H) 0.44 - 1.00 mg/dL   Calcium 7.5 (L) 8.9 - 10.3 mg/dL   GFR calc non Af Amer 50 (L) >60 mL/min   GFR calc Af Amer 58 (L) >60 mL/min    Comment: (NOTE) The eGFR has been calculated using the CKD EPI equation. This  calculation has not been validated in all clinical situations. eGFR's persistently <60 mL/min signify possible Chronic Kidney Disease.    Anion gap 6 5 - 15    Imaging / Studies: No results found.  Medications / Allergies: per chart  Antibiotics: Anti-infectives    Start     Dose/Rate Route Frequency Ordered Stop   01/28/16 1100  gentamicin (GARAMYCIN) 320 mg in dextrose 5 % 100 mL IVPB     5 mg/kg  63 kg (Adjusted) 108 mL/hr over 60 Minutes Intravenous Every 24 hours 01/28/16 0604 01/31/16 1059  01/27/16 2359  clindamycin (CLEOCIN) IVPB 600 mg     600 mg 100 mL/hr over 30 Minutes Intravenous Every 8 hours 01/27/16 2253 01/30/16 2359   01/27/16 0600  clindamycin (CLEOCIN) IVPB 900 mg     900 mg 100 mL/hr over 30 Minutes Intravenous 30 min pre-op 01/26/16 1329 01/27/16 1700   01/26/16 1613  gentamicin (GARAMYCIN) 320 mg in dextrose 5 % 100 mL IVPB     5 mg/kg  63 kg (Adjusted) 216 mL/hr over 30 Minutes Intravenous 30 min pre-op 01/26/16 1613 01/27/16 1100   01/26/16 1254  clindamycin (CLEOCIN) IVPB 900 mg  Status:  Discontinued     900 mg 100 mL/hr over 30 Minutes Intravenous 30 min pre-op 01/26/16 1254 01/26/16 1329   01/26/16 1254  gentamicin (GARAMYCIN) IVPB 80 mg  Status:  Discontinued     80 mg 100 mL/hr over 30 Minutes Intravenous 30 min pre-op 01/26/16 1254 01/26/16 1327        Note: Portions of this report may have been transcribed using voice recognition software. Every effort was made to ensure accuracy; however, inadvertent computerized transcription errors may be present.   Any transcriptional errors that result from this process are unintentional.     Adin Hector, M.D., F.A.C.S. Gastrointestinal and Minimally Invasive Surgery [facility] [address] [place], [place] [address] [phone] Main / Paging   01/30/2016  CARE TEAM:  PCP: Default, Provider, MD  Outpatient Care Team: Patient Care  Team: Provider Default, MD as PCP - General  Inpatient Treatment Team: Treatment Team: Attending Provider: Alexis Frock, MD; Consulting Physician: Md Edison Pace, MD; Registered Nurse: Claria Dice, RN; Technician: Sheron Nightingale, NT; Registered Nurse: Rica Koyanagi, RN; Technician: Araceli Bouche, NT; Registered Nurse: Lenice Pressman, RN

## 2016-01-31 LAB — GLUCOSE, CAPILLARY
GLUCOSE-CAPILLARY: 128 mg/dL — AB (ref 65–99)
GLUCOSE-CAPILLARY: 93 mg/dL (ref 65–99)
Glucose-Capillary: 99 mg/dL (ref 65–99)
Glucose-Capillary: 134 mg/dL — ABNORMAL HIGH (ref 65–99)
Glucose-Capillary: 112 mg/dL — ABNORMAL HIGH (ref 65–99)

## 2016-01-31 LAB — BASIC METABOLIC PANEL
Anion gap: 8 (ref 5–15)
BUN: 9 mg/dL (ref 6–20)
CHLORIDE: 105 mmol/L (ref 101–111)
CO2: 24 mmol/L (ref 22–32)
Calcium: 8 mg/dL — ABNORMAL LOW (ref 8.9–10.3)
Creatinine, Ser: 0.71 mg/dL (ref 0.44–1.00)
GFR calc Af Amer: >60 mL/min (ref >60)
GFR calc non Af Amer: >60 mL/min (ref >60)
GLUCOSE: 123 mg/dL — AB (ref 65–99)
POTASSIUM: 4.4 mmol/L (ref 3.5–5.1)
Sodium: 137 mmol/L (ref 135–145)

## 2016-01-31 LAB — TYPE AND SCREEN
ABO/RH(D): O POS
Antibody Screen: NEGATIVE
Unit division: 0
Unit division: 0

## 2016-01-31 LAB — HEMOGLOBIN AND HEMATOCRIT, BLOOD
HCT: 26.9 % — ABNORMAL LOW (ref 36.0–46.0)
Hemoglobin: 9.4 g/dL — ABNORMAL LOW (ref 12.0–15.0)

## 2016-01-31 MED ORDER — SENNOSIDES-DOCUSATE SODIUM 8.6-50 MG PO TABS
1.0000 | ORAL_TABLET | Freq: Two times a day (BID) | ORAL | Status: DC
  Administered 2016-01-31 – 2016-02-05 (×10): 1 via ORAL
  Filled 2016-01-31 (×10): qty 1

## 2016-01-31 NOTE — Progress Notes (Signed)
[facility] Office:  [phone] General Surgery Progress Note   LOS: 5 days  POD -  4 Days Post-Op  Assessment/Plan: 1.  Cystoscopy, Robotic converted to open radical cystectomy with pelvic exenteration including hysterectomy with bilateral salpingo-oophorectomy, total urethrectomy, bilateral pelvic lymphadenectomy. Colon conduit urinary diversion. Extensive laparoscopic adhesiolysis. Partial colectomy with end colostomy as performed 01/27/16 - Dr. Alexis Frock, Dr. Fanny Skates, and Dr. Michael Boston  For refractory high-grade superficial bladder cancer  On Entereg  On clear liquids.  I would not advance diet at this time, until there is more in the ostomy bag    2.  Prior history of colon cancer with resection, anastomosis and chemotherapy. 3.  On isolation for MRSA. 4.  DVT: SCD.  SQ heparin held because of drop in HGB post op. Should it be restarted? 5.  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  Taking small amnts of clear liquids.  Objective:   Filed Vitals:   01/31/16 0401 01/31/16 0610  BP:  138/49  Pulse:  85  Temp:  98.5 F (36.9 C)  Resp: 19 12     Intake/Output from previous day:  02/11 0701 - 02/12 0700 In: 2280.4 [I.V.:1972.4; IV Piggyback:308] Out: 2345 [Urine:2300; Drains:45]  Intake/Output this shift:      Physical Exam:   General: WN older WF who is alert and oriented.    HEENT: Normal. Pupils equal. .   Lungs: Clear   Abdomen: Mild distention.   Wound: Urostomy in RUQ.  Ostomy in LUQ (small amount of watery stool). Midline wound okay.   Lab Results:    Recent Labs  01/30/16 0455 01/31/16 0545  HGB 9.3* 9.4*  HCT 27.4* 26.9*    BMET   Recent Labs  01/30/16 0455 01/31/16 0545  NA 136 137  K 4.5 4.4  CL 106 105  CO2 24 24  GLUCOSE 154*  123*  BUN 11 9  CREATININE 1.10* 0.71  CALCIUM 7.5* 8.0*    PT/INR  No results for input(s): LABPROT, INR in the last 72 hours.  ABG  No results for input(s): PHART, HCO3 in the last 72 hours.  Invalid input(s): PCO2, PO2   Studies/Results:  No results found.   Anti-infectives:   Anti-infectives    Start     Dose/Rate Route Frequency Ordered Stop   01/28/16 1100  gentamicin (GARAMYCIN) 320 mg in dextrose 5 % 100 mL IVPB     5 mg/kg  63 kg (Adjusted) 108 mL/hr over 60 Minutes Intravenous Every 24 hours 01/28/16 0604 01/30/16 1245   01/27/16 2359  clindamycin (CLEOCIN) IVPB 600 mg     600 mg 100 mL/hr over 30 Minutes Intravenous Every 8 hours 01/27/16 2253 01/30/16 1818   01/27/16 0600  clindamycin (CLEOCIN) IVPB 900 mg     900 mg 100 mL/hr over 30 Minutes Intravenous 30 min pre-op 01/26/16 1329 01/27/16 1700   01/26/16 1613  gentamicin (GARAMYCIN) 320 mg in dextrose 5 % 100 mL IVPB     5 mg/kg  63 kg (Adjusted) 216 mL/hr over 30 Minutes Intravenous 30 min pre-op 01/26/16 1613 01/27/16 1100   01/26/16 1254  clindamycin (CLEOCIN) IVPB 900 mg  Status:  Discontinued     900 mg 100 mL/hr over 30 Minutes Intravenous 30 min pre-op 01/26/16 1254 01/26/16 1329  01/26/16 1254  gentamicin (GARAMYCIN) IVPB 80 mg  Status:  Discontinued     80 mg 100 mL/hr over 30 Minutes Intravenous 30 min pre-op 01/26/16 1254 01/26/16 1327      Alphonsa Overall, MD, FACS Pager: [facility] Office: [phone] 01/31/2016

## 2016-01-31 NOTE — Progress Notes (Signed)
4 Days Post-Op  Subjective:   1 - High Grade Bladder Cancer  - s/p open radical cystectomy + TAH/BSO + total uretrhectomy + pelvic node dissection + colon conduit and end colostomy 01/27/16, path pending. Stepdown 2/8. Now floor 2/11. JP output minimal. Urostomy stoma pink and healthy with visible Bander stents.  2 - Extensive Abdominal Adhesions, H/o Colon Cancer - concomitant open segmetnal colon resection and end colostomy 01/27/16 for extensive colonic adhesions adherent to left ureter / bladder at time of cystectomy 01/27/16. Initially NPO / ice chips following surgery. Clears started 2/11 w good bowel sounds and no emesis.   3 - Acute Blood Loss Anemia - approx 1000 EBL surgery 2/8. Hgb initially stable at 9.3 and started SQ heparin proph. Hgb drop to 7.3 noted 2/10 and with tachycardia. Transfused 2uPRBC 01/29/16 with appropriate response back up to Hb of 9.3 and has remained stable.  4 - Disposition  - pt independent in all ADL's at baseline. Floor status. PT working with patient, rec rolling walker and OT consult. Rec 3x/week evaluation.  Today "[name]" is stable. Pain controlled on PCA. Advanced to clears yesterday which she has tolerated, +BS, some scant gas now in colostomy bag. Getting more mobile.   Objective: Vital signs in last 24 hours: Temp:  [97.5 F (36.4 C)-98.7 F (37.1 C)] 98.5 F (36.9 C) (02/12 0610) Pulse Rate:  [81-96] 85 (02/12 0610) Resp:  [8-22] 12 (02/12 0610) BP: (95-144)/(37-60) 138/49 mmHg (02/12 0610) SpO2:  [95 %-100 %] 95 % (02/12 0610) Last BM Date: 01/26/16  Intake/Output from previous day: 02/11 0701 - 02/12 0700 In: 2280.4 [I.V.:1972.4; IV Piggyback:308] Out: 2345 [Urine:2300; Drains:45] Intake/Output this shift:    General appearance: alert, cooperative and appears stated [name] Eyes: negative Nose: Nares normal. Septum midline. Mucosa normal. No drainage or sinus tenderness., On [place] O2 Throat: lips, mucosa, and tongue normal; teeth and gums  normal Neck: supple, symmetrical, trachea midline Back: symmetric, no curvature. ROM normal. No CVA tenderness. Resp: non-labored on [place] O2 Cardio: regular tachycardia by bedisde monitor.  GI: soft, non-tender; bowel sounds normal; no masses,  no organomegaly Extremities: extremities normal, atraumatic, no cyanosis or edema Pulses: 2+ and symmetric Skin: Skin color, texture, turgor normal. No rashes or lesions Lymph nodes: Cervical, supraclavicular, and axillary nodes normal. Neurologic: Grossly normal Incision/Wound:  Midline incision c/d/i with staples and dry dresing. +BS. RLQ Urostomy pink / patent with bander stents and copious light pink urine. LLQ end colostomy pinnk with some scan gas in appliance.  Lab Results:   Recent Labs  01/30/16 0455 01/31/16 0545  HGB 9.3* 9.4*  HCT 27.4* 26.9*   BMET  Recent Labs  01/30/16 0455 01/31/16 0545  NA 136 137  K 4.5 4.4  CL 106 105  CO2 24 24  GLUCOSE 154* 123*  BUN 11 9  CREATININE 1.10* 0.71  CALCIUM 7.5* 8.0*   PT/INR No results for input(s): LABPROT, INR in the last 72 hours. ABG No results for input(s): PHART, HCO3 in the last 72 hours.  Invalid input(s): PCO2, PO2  Studies/Results: No results found.  Anti-infectives: Anti-infectives    Start     Dose/Rate Route Frequency Ordered Stop   01/28/16 1100  gentamicin (GARAMYCIN) 320 mg in dextrose 5 % 100 mL IVPB     5 mg/kg  63 kg (Adjusted) 108 mL/hr over 60 Minutes Intravenous Every 24 hours 01/28/16 0604 01/30/16 1245   01/27/16 2359  clindamycin (CLEOCIN) IVPB 600 mg     600  mg 100 mL/hr over 30 Minutes Intravenous Every 8 hours 01/27/16 2253 01/30/16 1818   01/27/16 0600  clindamycin (CLEOCIN) IVPB 900 mg     900 mg 100 mL/hr over 30 Minutes Intravenous 30 min pre-op 01/26/16 1329 01/27/16 1700   01/26/16 1613  gentamicin (GARAMYCIN) 320 mg in dextrose 5 % 100 mL IVPB     5 mg/kg  63 kg (Adjusted) 216 mL/hr over 30 Minutes Intravenous 30 min pre-op  01/26/16 1613 01/27/16 1100   01/26/16 1254  clindamycin (CLEOCIN) IVPB 900 mg  Status:  Discontinued     900 mg 100 mL/hr over 30 Minutes Intravenous 30 min pre-op 01/26/16 1254 01/26/16 1329   01/26/16 1254  gentamicin (GARAMYCIN) IVPB 80 mg  Status:  Discontinued     80 mg 100 mL/hr over 30 Minutes Intravenous 30 min pre-op 01/26/16 1254 01/26/16 1327      Assessment/Plan:  1 - High Grade Bladder Cancer  - path pending.   2 - Extensive Abdominal Adhesions, H/o Colon Cancer - Clear liquids, awaiting return of more bowel function prior to advancing diet further. Start senna-S BID today for gentle pro-motility effect.   3 - Acute Blood Loss Anemia -appropriate response to [facility] 01/29/16, will consider restarting proph heparin soon. JP output remains scant and wound w/o drainage, most likely equilibration following surgery. Now stable.   4 - Disposition  - continue monitored med-surg bed. Continues to work with PT/OT to determine post-discharge needs.  [name] 01/31/2016

## 2016-01-31 NOTE — Consult Note (Signed)
WOC ostomy consult note: Colon conduit Stoma type/location: RUQ ileal conduit (urostomy) Stomal assessment/size: 1 and 3/4 inch oval, budded, red, moist, edematous with two stents intact. Red = right and Blue = left Peristomal assessment: intact, clear. Defect noted at 7 o'clock Treatment options for stomal/peristomal skin: skin barrier ring Output blood-tinged urine, clear Ostomy pouching: 1pc.pouch with convexity and skin barrier ring Education provided: Husband present, but sits across room for pouching system changes.  Discussed A&P of GI/GU tracts; patient using PCA and dosing off during care. Enrolled patient in [place] program: No  WOC ostomy consult note Stoma type/location: LUQ colostomy Stomal assessment/size: 1 and 3/4 inches, 25% red, 75% with outer layer of mucosa evident Peristomal assessment: intact, clear Treatment options for stomal/peristomal skin: skin barrier ring Output: dark brown/green liquid effluent Ostomy pouching: 2 piece 2 and 1/4 inch pouching system with skin barrier ring Education provided: See above.  Discussed purchasing supplies with patient's husband; they know of someone who purchases her supplies in [place], but that person does not have Medicare and a supplemental policy.  We will discuss supplies again in a few days.  Patient's husband is anxious to discuss disposition; their granddaughter (32) lives with them, she would like to go home with [facility], but may be too weak. Enrolled patient in [place] program: No   WOC nursing team will  follow, and will remain available to this patient, the nursing, surgical and medical teams.   Maudie Flakes, MSN, RN, [place], Arther Abbott  Pager# [phone]

## 2016-02-01 LAB — BASIC METABOLIC PANEL
Anion gap: 7 (ref 5–15)
BUN: 11 mg/dL (ref 6–20)
CO2: 28 mmol/L (ref 22–32)
CREATININE: 0.61 mg/dL (ref 0.44–1.00)
Calcium: 7.8 mg/dL — ABNORMAL LOW (ref 8.9–10.3)
Chloride: 102 mmol/L (ref 101–111)
GFR calc Af Amer: >60 mL/min (ref >60)
GLUCOSE: 124 mg/dL — AB (ref 65–99)
POTASSIUM: 4.2 mmol/L (ref 3.5–5.1)
Sodium: 137 mmol/L (ref 135–145)

## 2016-02-01 LAB — GLUCOSE, CAPILLARY
Glucose-Capillary: 116 mg/dL — ABNORMAL HIGH (ref 65–99)
Glucose-Capillary: 112 mg/dL — ABNORMAL HIGH (ref 65–99)
Glucose-Capillary: 105 mg/dL — ABNORMAL HIGH (ref 65–99)

## 2016-02-01 LAB — HEMOGLOBIN A1C
HEMOGLOBIN A1C: 7.1 % — AB (ref 4.8–5.6)
MEAN PLASMA GLUCOSE: 157 mg/dL

## 2016-02-01 LAB — HEMOGLOBIN AND HEMATOCRIT, BLOOD
HEMATOCRIT: 28.2 % — AB (ref 36.0–46.0)
Hemoglobin: 9.3 g/dL — ABNORMAL LOW (ref 12.0–15.0)

## 2016-02-01 NOTE — Progress Notes (Signed)
5 Days Post-Op  Subjective:   1 - High Grade Bladder Cancer  - s/p open radical cystectomy + TAH/BSO + total uretrhectomy + pelvic node dissection + colon conduit and end colostomy 01/27/16, path pending. Stepdown 2/8. Now floor 2/11. JP output minimal. Urostomy stoma pink and healthy with visible Bander stents.  2 - Extensive Abdominal Adhesions, H/o Colon Cancer - concomitant open segmetnal colon resection and end colostomy 01/27/16 for extensive colonic adhesions adherent to left ureter / bladder at time of cystectomy 01/27/16. Initially NPO / ice chips following surgery. Clears started 2/11 w good bowel sounds and no emesis. Adv to fulls 2/13.   3 - Acute Blood Loss Anemia - approx 1000 EBL surgery 2/8. Hgb initially stable at 9.3 and started SQ heparin proph. Hgb drop to 7.3 noted 2/10 and with tachycardia. Transfused 2uPRBC 01/29/16 with appropriate response back up to Hb of 9.3 and has remained stable.  4 - Disposition  - pt independent in all ADL's at baseline. Floor status. PT working with patient, rec rolling walker and OT consult. Rec 3x/week evaluation.  Today "[name]" is without complaints. No fevers. Tollerating clears w/o emesis.   Objective: Vital signs in last 24 hours: Temp:  [98.2 F (36.8 C)-98.5 F (36.9 C)] 98.2 F (36.8 C) (02/13 0549) Pulse Rate:  [85-90] 85 (02/13 0549) Resp:  [10-20] 13 (02/13 0549) BP: (128-132)/(49-54) 132/54 mmHg (02/13 0549) SpO2:  [95 %-98 %] 98 % (02/13 0549) Last BM Date: 01/26/16  Intake/Output from previous day: 02/12 0701 - 02/13 0700 In: 1207.9 [I.V.:1207.9] Out: 2445 [Urine:2375; Drains:70] Intake/Output this shift:    General appearance: alert, cooperative and appears stated age Eyes: negative Nose: Nares normal. Septum midline. Mucosa normal. No drainage or sinus tenderness., [place] O2 Throat: lips, mucosa, and tongue normal; teeth and gums normal Neck: supple, symmetrical, trachea midline Back: symmetric, no curvature. ROM normal. No  CVA tenderness. Resp: non-labored on minimal [place] O2 Cardio: Nl rate GI: soft, non-tender; bowel sounds normal; no masses,  no organomegaly and LLQ colostomy with small lq output and gas. No formed stool  Pelvic: external genitalia normal and scant vaginal spotting as anticipated.  Extremities: extremities normal, atraumatic, no cyanosis or edema Pulses: 2+ and symmetric Skin: Skin color, texture, turgor normal. No rashes or lesions Lymph nodes: Cervical, supraclavicular, and axillary nodes normal. Neurologic: Grossly normal Incision/Wound: midline incision c/d/i. RLQ Urostomy pink / patent with bilat bander stents and coious yellow urine.   Lab Results:   Recent Labs  01/31/16 0545 02/01/16 0558  HGB 9.4* 9.3*  HCT 26.9* 28.2*   BMET  Recent Labs  01/31/16 0545 02/01/16 0558  NA 137 137  K 4.4 4.2  CL 105 102  CO2 24 28  GLUCOSE 123* 124*  BUN 9 11  CREATININE 0.71 0.61  CALCIUM 8.0* 7.8*   PT/INR No results for input(s): LABPROT, INR in the last 72 hours. ABG No results for input(s): PHART, HCO3 in the last 72 hours.  Invalid input(s): PCO2, PO2  Studies/Results: No results found.  Anti-infectives: Anti-infectives    Start     Dose/Rate Route Frequency Ordered Stop   01/28/16 1100  gentamicin (GARAMYCIN) 320 mg in dextrose 5 % 100 mL IVPB     5 mg/kg  63 kg (Adjusted) 108 mL/hr over 60 Minutes Intravenous Every 24 hours 01/28/16 0604 01/30/16 1245   01/27/16 2359  clindamycin (CLEOCIN) IVPB 600 mg     600 mg 100 mL/hr over 30 Minutes Intravenous Every 8 hours  01/27/16 2253 01/30/16 1818   01/27/16 0600  clindamycin (CLEOCIN) IVPB 900 mg     900 mg 100 mL/hr over 30 Minutes Intravenous 30 min pre-op 01/26/16 1329 01/27/16 1700   01/26/16 1613  gentamicin (GARAMYCIN) 320 mg in dextrose 5 % 100 mL IVPB     5 mg/kg  63 kg (Adjusted) 216 mL/hr over 30 Minutes Intravenous 30 min pre-op 01/26/16 1613 01/27/16 1100   01/26/16 1254  clindamycin (CLEOCIN) IVPB 900  mg  Status:  Discontinued     900 mg 100 mL/hr over 30 Minutes Intravenous 30 min pre-op 01/26/16 1254 01/26/16 1329   01/26/16 1254  gentamicin (GARAMYCIN) IVPB 80 mg  Status:  Discontinued     80 mg 100 mL/hr over 30 Minutes Intravenous 30 min pre-op 01/26/16 1254 01/26/16 1327      Assessment/Plan:  1 - High Grade Bladder Cancer  - path pending.   2 - Extensive Abdominal Adhesions, H/o Colon Cancer - adv to fulls.  3 - Acute Blood Loss Anemia -appropriate response to [facility] 01/29/16, now stable.   4 - Disposition  - continue monitored med-surg bed. Continues to work with PT/OT to determine post-discharge needs.  [facility], [name] 02/01/2016

## 2016-02-01 NOTE — Progress Notes (Signed)
Physical Therapy Treatment Patient Details Name: [name] MRN: [id number] DOB: 11-Aug-1946 Today's Date: 02/01/2016    History of Present Illness  ^69 yo female with h/o colon cancer ,high grade bladder cancer admitted 01/26/16 for Cystoscopy, Robotic converted to open radical cystectomy , hysterectomy, total urethrectomy, bilateral pelvic lymphadenectomy. Colon conduit urinary diversion. Extensive laparoscopic adhesiolysis. Partial colectomy with end colostomy as performed 01/27/16,    PT Comments    POD # 5 Pt did well. Assisted out of recliner and amb in hallway.   Follow Up Recommendations  Home health PT;Supervision/Assistance - 24 hour     Equipment Recommendations  Rolling walker with 5" wheels    Recommendations for Other Services       Precautions / Restrictions Precautions Precaution Comments: monitor  for low BP, colostomy, urostomy,drain x 1 Restrictions Weight Bearing Restrictions: No    Mobility  Bed Mobility               General bed mobility comments: Pt OOB in recliner  Transfers Overall transfer level: Needs assistance Equipment used: Rolling walker (2 wheeled) Transfers: Sit to/from Stand Sit to Stand: Min assist;+2 safety/equipment         General transfer comment: 25% VC's on safety with turns and hand placement with stand to sit  Ambulation/Gait Ambulation/Gait assistance: Min assist;+2 safety/equipment Ambulation Distance (Feet): 55 Feet Assistive device: Rolling walker (2 wheeled) Gait Pattern/deviations: Step-to pattern;Step-through pattern;Decreased stride length;Decreased step length - left;Decreased step length - right;Trunk flexed     General Gait Details: + 2 for safety and recliner to follow.  Tolerated well.   Stairs            Wheelchair Mobility    Modified Rankin (Stroke Patients Only)       Balance                                    Cognition Arousal/Alertness: Awake/alert Behavior  During Therapy: WFL for tasks assessed/performed Overall Cognitive Status: Within Functional Limits for tasks assessed                      Exercises      General Comments        Pertinent Vitals/Pain Pain Assessment: 0-10 Pain Score: 5  Pain Location: ABd Pain Descriptors / Indicators: Cramping;Grimacing;Discomfort Pain Intervention(s): Monitored during session;Repositioned    Home Living                      Prior Function            PT Goals (current goals can now be found in the care plan section) Progress towards PT goals: Progressing toward goals    Frequency  Min 3X/week    PT Plan      Co-evaluation             End of Session Equipment Utilized During Treatment: Gait belt Activity Tolerance: Patient tolerated treatment well Patient left: in chair;with call bell/phone within reach     Time: 1030-1055 PT Time Calculation (min) (ACUTE ONLY): 25 min  Charges:  $Gait Training: 8-22 mins $Therapeutic Activity: 8-22 mins                    G Codes:      Rica Koyanagi  PTA [facility]  Acute  Rehab Pager      [phone]

## 2016-02-01 NOTE — Progress Notes (Signed)
Progress Note: General Surgery Service   Subjective: Pain controlled, tolerating clears  Objective: Vital signs in last 24 hours: Temp:  [98.2 F (36.8 C)-98.5 F (36.9 C)] 98.2 F (36.8 C) (02/13 0549) Pulse Rate:  [85-90] 85 (02/13 0549) Resp:  [10-20] 13 (02/13 0549) BP: (128-132)/(49-54) 132/54 mmHg (02/13 0549) SpO2:  [95 %-98 %] 98 % (02/13 0549) Last BM Date: 01/26/16  Intake/Output from previous day: 02/12 0701 - 02/13 0700 In: 1810.9 [I.V.:1810.9] Out: 2445 [Urine:2375; Drains:70] Intake/Output this shift:    Lungs: CTAB  Cardiovascular: RRR  Abd: soft, right bladder conduit with pink mucosa, left end colostomy edematous and red with liquid and gas output  Extremities: no edema  Neuro: AOx4  Lab Results: CBC   Recent Labs  01/31/16 0545 02/01/16 0558  HGB 9.4* 9.3*  HCT 26.9* 28.2*   BMET  Recent Labs  01/31/16 0545 02/01/16 0558  NA 137 137  K 4.4 4.2  CL 105 102  CO2 24 28  GLUCOSE 123* 124*  BUN 9 11  CREATININE 0.71 0.61  CALCIUM 8.0* 7.8*   PT/INR No results for input(s): LABPROT, INR in the last 72 hours. ABG No results for input(s): PHART, HCO3 in the last 72 hours.  Invalid input(s): PCO2, PO2  Studies/Results:  Anti-infectives: Anti-infectives    Start     Dose/Rate Route Frequency Ordered Stop   01/28/16 1100  gentamicin (GARAMYCIN) 320 mg in dextrose 5 % 100 mL IVPB     5 mg/kg  63 kg (Adjusted) 108 mL/hr over 60 Minutes Intravenous Every 24 hours 01/28/16 0604 01/30/16 1245   01/27/16 2359  clindamycin (CLEOCIN) IVPB 600 mg     600 mg 100 mL/hr over 30 Minutes Intravenous Every 8 hours 01/27/16 2253 01/30/16 1818   01/27/16 0600  clindamycin (CLEOCIN) IVPB 900 mg     900 mg 100 mL/hr over 30 Minutes Intravenous 30 min pre-op 01/26/16 1329 01/27/16 1700   01/26/16 1613  gentamicin (GARAMYCIN) 320 mg in dextrose 5 % 100 mL IVPB     5 mg/kg  63 kg (Adjusted) 216 mL/hr over 30 Minutes Intravenous 30 min pre-op  01/26/16 1613 01/27/16 1100   01/26/16 1254  clindamycin (CLEOCIN) IVPB 900 mg  Status:  Discontinued     900 mg 100 mL/hr over 30 Minutes Intravenous 30 min pre-op 01/26/16 1254 01/26/16 1329   01/26/16 1254  gentamicin (GARAMYCIN) IVPB 80 mg  Status:  Discontinued     80 mg 100 mL/hr over 30 Minutes Intravenous 30 min pre-op 01/26/16 1254 01/26/16 1327      Medications: Scheduled Meds: . acetaminophen  1,000 mg Oral TID  . alvimopan  12 mg Oral BID  . antiseptic oral rinse  7 mL Mouth Rinse q12n4p  . chlorhexidine  15 mL Mouth Rinse BID  . Chlorhexidine Gluconate Cloth  6 each Topical Q0600  . HYDROmorphone   Intravenous 6 times per day  . insulin aspart  0-5 Units Subcutaneous QHS  . insulin aspart  0-9 Units Subcutaneous TID WC  . lip balm  1 application Topical BID  . mupirocin ointment  1 application Nasal BID  . senna-docusate  1 tablet Oral BID   Continuous Infusions: . dextrose 5 % and 0.45 % NaCl with KCl 20 mEq/L 75 mL/hr at 01/31/16 2035   PRN Meds:.alum & mag hydroxide-simeth, diphenhydrAMINE **OR** diphenhydrAMINE, magic mouthwash, menthol-cetylpyridinium, methocarbamol (ROBAXIN)  IV, naloxone **AND** sodium chloride flush, ondansetron (ZOFRAN) IV, phenol  Assessment/Plan: Patient Active Problem List  Diagnosis Date Noted  . Bladder cancer s/p cystectomy/urostomy/TAH/colectomy/colostomy 01/27/2016 01/27/2016  . 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 01/27/2016   s/p Procedure(s): XI ROBOTIC ASSISTED LAPAROSCOPIC COMPLETE CYSTECT ILEAL CONDUIT, HYSTERECTOMY, BILATERAL OOPHORECTOMY,  CYSTOSCOPY WITH INDOCYANINE GREEN DYE CONVERTED TO OPEN LYMPHADENECTOMY LYMPHADENECTOMY EXTENSIVE  LYSIS OF ADHESIONS, REPAIR OF ADHESIONS END COLOSTOMY SIGMOID  COLECTOMY 01/27/2016  -ok to advance diet -continue pain control -Hgb stable for 24h   LOS: 6 days   Mickeal Skinner, MD Pg# [phone] [facility]

## 2016-02-01 NOTE — Evaluation (Signed)
Occupational Therapy Evaluation Patient Details Name: [name] MRN: [id number] DOB: 1946/03/20 Today's Date: 02/01/2016    History of Present Illness  ^70 yo female with h/o colon cancer ,high grade bladder cancer admitted 01/26/16 for Cystoscopy, Robotic converted to open radical cystectomy , hysterectomy, total urethrectomy, bilateral pelvic lymphadenectomy. Colon conduit urinary diversion. Extensive laparoscopic adhesiolysis. Partial colectomy with end colostomy as performed 01/27/16,   Clinical Impression   Pt admitted with above. She demonstrates the below listed deficits and will benefit from continued OT to maximize safety and independence with BADLs.  Pt presents to OT with increased pain, generalized weakness, and decreased activity tolerance.  Currently, she requires mod - max A for ADLs.  She may benefit from AE for LB ADLs.  Will follow acutely.       Follow Up Recommendations  Home health OT;Supervision/Assistance - 24 hour    Equipment Recommendations  None recommended by OT    Recommendations for Other Services       Precautions / Restrictions Precautions Precautions: Fall Precaution Comments: monitor  for low BP, colostomy, urostomy,drain x 1 Restrictions Weight Bearing Restrictions: No      Mobility Bed Mobility Overal bed mobility: Needs Assistance Bed Mobility: Rolling;Sidelying to Sit Rolling: Min assist Sidelying to sit: Mod assist       General bed mobility comments: Pt requires assist to roll and to lift trunk.  Instructed her to roll to move EOB.   Transfers Overall transfer level: Needs assistance Equipment used: Rolling walker (2 wheeled) Transfers: Sit to/from [facility] Sit to Stand: Min assist Stand pivot transfers: Min assist       General transfer comment: Pt limited by pain     Balance Overall balance assessment: Needs assistance Sitting-balance support: Feet supported Sitting balance-Leahy Scale: Fair     Standing balance support: Bilateral upper extremity supported Standing balance-Leahy Scale: Poor Standing balance comment: reliant on UE support                             ADL Overall ADL's : Needs assistance/impaired Eating/Feeding: Independent   Grooming: Wash/dry hands;Wash/dry face;Oral care;Brushing hair;Set up;Sitting   Upper Body Bathing: Minimal assitance;Sitting   Lower Body Bathing: Maximal assistance;Sit to/from stand   Upper Body Dressing : Moderate assistance;Sitting   Lower Body Dressing: Maximal assistance;Sit to/from stand Lower Body Dressing Details (indicate cue type and reason): Pt unable to access LEs Toilet Transfer: Minimal assistance;Ambulation;Comfort height toilet;BSC;Grab bars;RW     Toileting - Clothing Manipulation Details (indicate cue type and reason): Pt with colostomy and urostomy      Functional mobility during ADLs: Minimal assistance;Rolling walker       Vision     Perception     Praxis      Pertinent Vitals/Pain Pain Assessment: 0-10 Pain Score: 7  Pain Location: Lt abdomen with rolling to Rt and sit to stand  Pain Descriptors / Indicators: Burning;Grimacing;Guarding Pain Intervention(s): Monitored during session;Repositioned;PCA encouraged     Hand Dominance Right   Extremity/Trunk Assessment Upper Extremity Assessment Upper Extremity Assessment: Generalized weakness   Lower Extremity Assessment Lower Extremity Assessment: Defer to PT evaluation   Cervical / Trunk Assessment Cervical / Trunk Assessment: Normal   Communication Communication Communication: No difficulties   Cognition Arousal/Alertness: Awake/alert Behavior During Therapy: WFL for tasks assessed/performed Overall Cognitive Status: Within Functional Limits for tasks assessed  General Comments       Exercises       Shoulder Instructions      Home Living Family/patient expects to be discharged to:: Private  residence Living Arrangements: Spouse/significant other Available Help at Discharge: Family Type of Home: House Home Access: Stairs to enter Technical brewer of Steps: 2   Home Layout: One level     Bathroom Shower/Tub: Occupational psychologist: [place]: Shower seat;Grab bars - toilet;Grab bars - tub/shower   Additional Comments: Pt lives with spouse and grand daughter       Prior Functioning/Environment Level of Independence: Independent        Comments: Pt is caregiver for spouse    OT Diagnosis: Generalized weakness;Acute pain   OT Problem List: Decreased strength;Decreased activity tolerance;Impaired balance (sitting and/or standing);Decreased safety awareness;Decreased knowledge of use of DME or AE;Pain   OT Treatment/Interventions: Self-care/ADL training;DME and/or AE instruction;Therapeutic activities;Patient/family education;Balance training    OT Goals(Current goals can be found in the care plan section) Acute Rehab OT Goals Patient Stated Goal: to get home  OT Goal Formulation: With patient Time For Goal Achievement: 02/15/16 Potential to Achieve Goals: Good ADL Goals Pt Will Perform Grooming: with supervision;standing Pt Will Perform Upper Body Bathing: with supervision;sitting Pt Will Perform Lower Body Bathing: with supervision;sit to/from stand;with adaptive equipment Pt Will Perform Upper Body Dressing: with supervision;sitting Pt Will Perform Lower Body Dressing: with supervision;with adaptive equipment;sit to/from stand Pt Will Transfer to Toilet: with supervision;ambulating;regular height toilet;bedside commode;grab bars Pt Will Perform Toileting - Clothing Manipulation and hygiene: with supervision;sit to/from stand  OT Frequency: Min 2X/week   Barriers to D/C:            Co-evaluation              End of Session Equipment Utilized During Treatment: Oxygen Nurse Communication: Mobility status;Other  (comment) (acute onset pain )  Activity Tolerance: Patient limited by pain Patient left: in bed;with call bell/phone within reach   Time: 1650-1710 OT Time Calculation (min): 20 min Charges:  OT General Charges $OT Visit: 1 Procedure OT Evaluation $OT Eval Moderate Complexity: 1 Procedure G-Codes:    Raphaela Cannaday M Feb 17, 2016, 6:01 PM

## 2016-02-02 LAB — GLUCOSE, CAPILLARY
GLUCOSE-CAPILLARY: 97 mg/dL (ref 65–99)
Glucose-Capillary: 97 mg/dL (ref 65–99)
Glucose-Capillary: 87 mg/dL (ref 65–99)
Glucose-Capillary: 96 mg/dL (ref 65–99)

## 2016-02-02 MED ORDER — OXYCODONE-ACETAMINOPHEN 5-325 MG PO TABS
1.0000 | ORAL_TABLET | ORAL | Status: DC | PRN
  Administered 2016-02-02: 2 via ORAL
  Administered 2016-02-02: 1 via ORAL
  Administered 2016-02-03 – 2016-02-05 (×10): 2 via ORAL
  Filled 2016-02-02 (×14): qty 2

## 2016-02-02 MED ORDER — HYDROMORPHONE HCL 1 MG/ML IJ SOLN
0.5000 mg | INTRAMUSCULAR | Status: DC | PRN
  Administered 2016-02-03 – 2016-02-04 (×7): 0.5 mg via INTRAVENOUS
  Filled 2016-02-02 (×7): qty 1

## 2016-02-02 NOTE — Care Management Important Message (Signed)
Important Message  Patient Details Important Message  Patient Details IM Letter given to Cookie/Case Manager to present to Patient Name: [name] MRN: [id number] Date of Birth: 1946/03/21   Medicare Important Message Given:  Yes    Camillo Flaming 02/02/2016, 12:51 PM Name: [name] MRN: [id number] Date of Birth: 1946-01-21   Medicare Important Message Given:  Yes    Camillo Flaming 02/02/2016, 12:51 PM

## 2016-02-02 NOTE — Progress Notes (Signed)
6 Days Post-Op  Subjective:   1 - High Grade Bladder Cancer  - s/p open radical cystectomy + TAH/BSO + total uretrhectomy + pelvic node dissection + colon conduit and end colostomy 01/27/16, for [id number] urothelial carcinoma with negative margins.  Stepdown 2/8. Now floor 2/11. JP output minimal. Urostomy stoma pink and healthy with visible Bander stents.  2 - Extensive Abdominal Adhesions, H/o Colon Cancer - concomitant open segmetnal colon resection and end colostomy 01/27/16 for extensive colonic adhesions adherent to left ureter / bladder at time of cystectomy 01/27/16. Initially NPO / ice chips following surgery. Clears started 2/11 w good bowel sounds and no emesis. Adv to fulls 2/13. Adv to red 2/14 as copious gas/stool in ostomy.  3 - Acute Blood Loss Anemia - approx 1000 EBL surgery 2/8. Hgb initially stable at 9.3 and started SQ heparin proph. Hgb drop to 7.3 noted 2/10 and with tachycardia. Transfused 2uPRBC 01/29/16 with appropriate response back up to Hb of 9.3 and has remained stable.  4 - Disposition  - pt independent in all ADL's at baseline. Floor status. PT working with patient, rec rolling walker and OT consult. Rec 3x/week evaluation.   Today [name] is feeling stronger. Vigorous gas / stool in colostomy now. We discussed stage 4 node positve pathology in detail last night.   Objective: Vital signs in last 24 hours: Temp:  [98 F (36.7 C)-98.6 F (37 C)] 98.6 F (37 C) (02/14 0634) Pulse Rate:  [81-90] 81 (02/14 0634) Resp:  [11-18] 17 (02/14 0634) BP: (139-149)/(51-65) 139/65 mmHg (02/14 0634) SpO2:  [96 %-99 %] 97 % (02/14 0634) Last BM Date: 01/26/16  Intake/Output from previous day: 02/13 0701 - 02/14 0700 In: 737 [P.O.:240; I.V.:497] Out: 3110 [Urine:3100; Drains:10] Intake/Output this shift:    General appearance: alert, cooperative and appears stated age Head: Normocephalic, without obvious abnormality, atraumatic Eyes: negative Nose: Nares normal. Septum midline.  Mucosa normal. No drainage or sinus tenderness. Throat: lips, mucosa, and tongue normal; teeth and gums normal Neck: supple, symmetrical, trachea midline Back: symmetric, no curvature. ROM normal. No CVA tenderness. Resp: non-labored on minimal [place] O2 Cardio: Nl rate GI: soft, non-tender; bowel sounds normal; no masses,  no organomegaly and copious liquid stool and gas in colosotmy, less abd distension Pelvic: external genitalia normal and minimal vag spotting Extremities: extremities normal, atraumatic, no cyanosis or edema Skin: Skin color, texture, turgor normal. No rashes or lesions Neurologic: Grossly normal Incision/Wound: midline incision c/d/i. RLQ Urostomy pink / patent with bander stents.   Lab Results:   Recent Labs  01/31/16 0545 02/01/16 0558  HGB 9.4* 9.3*  HCT 26.9* 28.2*   BMET  Recent Labs  01/31/16 0545 02/01/16 0558  NA 137 137  K 4.4 4.2  CL 105 102  CO2 24 28  GLUCOSE 123* 124*  BUN 9 11  CREATININE 0.71 0.61  CALCIUM 8.0* 7.8*   PT/INR No results for input(s): LABPROT, INR in the last 72 hours. ABG No results for input(s): PHART, HCO3 in the last 72 hours.  Invalid input(s): PCO2, PO2  Studies/Results: No results found.  Anti-infectives: Anti-infectives    Start     Dose/Rate Route Frequency Ordered Stop   01/28/16 1100  gentamicin (GARAMYCIN) 320 mg in dextrose 5 % 100 mL IVPB     5 mg/kg  63 kg (Adjusted) 108 mL/hr over 60 Minutes Intravenous Every 24 hours 01/28/16 0604 01/30/16 1245   01/27/16 2359  clindamycin (CLEOCIN) IVPB 600 mg     600  mg 100 mL/hr over 30 Minutes Intravenous Every 8 hours 01/27/16 2253 01/30/16 1818   01/27/16 0600  clindamycin (CLEOCIN) IVPB 900 mg     900 mg 100 mL/hr over 30 Minutes Intravenous 30 min pre-op 01/26/16 1329 01/27/16 1700   01/26/16 1613  gentamicin (GARAMYCIN) 320 mg in dextrose 5 % 100 mL IVPB     5 mg/kg  63 kg (Adjusted) 216 mL/hr over 30 Minutes Intravenous 30 min pre-op 01/26/16 1613  01/27/16 1100   01/26/16 1254  clindamycin (CLEOCIN) IVPB 900 mg  Status:  Discontinued     900 mg 100 mL/hr over 30 Minutes Intravenous 30 min pre-op 01/26/16 1254 01/26/16 1329   01/26/16 1254  gentamicin (GARAMYCIN) IVPB 80 mg  Status:  Discontinued     80 mg 100 mL/hr over 30 Minutes Intravenous 30 min pre-op 01/26/16 1254 01/26/16 1327      Assessment/Plan:  1 - High Grade Bladder Cancer  - aggressive, metastatic disease. May benefit from adjuvant chemo in about 13mo pending clinical status, no further cancer-directed therapy in house.   2 - Extensive Abdominal Adhesions, H/o Colon Cancer - clinical ileus completely resolved. Adv to carb mod diet.   3 - Acute Blood Loss Anemia -[facility], now stable.   4 - Disposition  - continue monitored med-surg bed. Likely DC home near end of week or over weekend based on current progress, appreciate PT/OT input.   [name] 02/02/2016

## 2016-02-02 NOTE — Progress Notes (Signed)
Patient ID: [name], female   DOB: July 14, 1946, 70 y.o.   MRN: [id number]     [facility]      [address] [place], [place] [address]    Phone: [phone] FAX: [phone]     Subjective: Tolerating po's. Ambulating. Afebrile.  Objective:  Vital signs:  Filed Vitals:   02/01/16 2149 02/02/16 0044 02/02/16 0215 02/02/16 0634  BP: 149/54   139/65  Pulse: 89   81  Temp: 98 F (36.7 C)   98.6 F (37 C)  TempSrc: Oral   Oral  Resp: 18 13 14 17   Height:      Weight:      SpO2: 98% 96% 97% 97%    Last BM Date: 01/26/16  Intake/Output   Yesterday:  02/13 0701 - 02/14 0700 In: 977 [P.O.:240; I.V.:737] Out: 3110 [Urine:3100; Drains:10] This shift:    I/O last 3 completed shifts: In: 2787.8 [P.O.:240; I.V.:2547.8] Out: 3662 [HUTML:4650; Drains:10]   Physical Exam: General: Pt awake/alert/oriented x4 in no acute distress Abdomen: Soft.  Nondistended.  appropriately tender.  Incisions is dry.  llq ostomy is functioning.  jp drain with serosanguinous output.  ilioconduit rlq.     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    Results:   Labs: Results for orders placed or performed during the hospital encounter of 01/26/16 (from the past 48 hour(s))  Glucose, capillary     Status: Abnormal   Collection Time: 01/31/16 12:11 PM  Result Value Ref Range   Glucose-Capillary 134 (H) 65 - 99 mg/dL  Glucose, capillary     Status: Abnormal   Collection Time: 01/31/16  5:44 PM  Result Value Ref Range   Glucose-Capillary 112 (H) 65 - 99 mg/dL  Glucose, capillary     Status: None   Collection Time: 01/31/16 10:43 PM  Result Value Ref Range   Glucose-Capillary 93 65 - 99 mg/dL   Comment 1 Notify RN    Comment 2 Document in Chart   Hemoglobin and hematocrit, blood     Status:  Abnormal   Collection Time: 02/01/16  5:58 AM  Result Value Ref Range   Hemoglobin 9.3 (L) 12.0 - 15.0 g/dL   HCT 28.2 (L) 36.0 - 35.4 %  Basic metabolic panel     Status: Abnormal   Collection Time: 02/01/16  5:58 AM  Result Value Ref Range   Sodium 137 135 - 145 mmol/L   Potassium 4.2 3.5 - 5.1 mmol/L   Chloride 102 101 - 111 mmol/L   CO2 28 22 - 32 mmol/L   Glucose, Bld 124 (H) 65 - 99 mg/dL   BUN 11 6 - 20 mg/dL   Creatinine, Ser 0.61 0.44 - 1.00 mg/dL   Calcium 7.8 (L) 8.9 - 10.3 mg/dL   GFR calc non Af Amer >60 >60 mL/min   GFR calc Af Amer >60 >60 mL/min    Comment: (NOTE) The eGFR has been calculated using the CKD EPI equation. This calculation has not been validated in all clinical situations. eGFR's persistently <60 mL/min signify possible Chronic Kidney Disease.    Anion gap 7 5 - 15  Glucose, capillary     Status: Abnormal   Collection Time: 02/01/16  7:20 AM  Result Value Ref Range   Glucose-Capillary 116 (H) 65 - 99 mg/dL  Glucose, capillary  Status: Abnormal   Collection Time: 02/01/16 12:09 PM  Result Value Ref Range   Glucose-Capillary 112 (H) 65 - 99 mg/dL  Glucose, capillary     Status: Abnormal   Collection Time: 02/01/16  5:18 PM  Result Value Ref Range   Glucose-Capillary 105 (H) 65 - 99 mg/dL  Glucose, capillary     Status: None   Collection Time: 02/01/16  9:44 PM  Result Value Ref Range   Glucose-Capillary 97 65 - 99 mg/dL  Glucose, capillary     Status: None   Collection Time: 02/02/16  7:38 AM  Result Value Ref Range   Glucose-Capillary 97 65 - 99 mg/dL    Imaging / Studies: No results found.  Medications / Allergies:  Scheduled Meds: . acetaminophen  1,000 mg Oral TID  . alvimopan  12 mg Oral BID  . antiseptic oral rinse  7 mL Mouth Rinse q12n4p  . chlorhexidine  15 mL Mouth Rinse BID  . Chlorhexidine Gluconate Cloth  6 each Topical Q0600  . insulin aspart  0-5 Units Subcutaneous QHS  . insulin aspart  0-9 Units Subcutaneous TID  WC  . lip balm  1 application Topical BID  . senna-docusate  1 tablet Oral BID   Continuous Infusions:  PRN Meds:.alum & mag hydroxide-simeth, HYDROmorphone (DILAUDID) injection, magic mouthwash, menthol-cetylpyridinium, methocarbamol (ROBAXIN)  IV, oxyCODONE-acetaminophen, phenol  Antibiotics: Anti-infectives    Start     Dose/Rate Route Frequency Ordered Stop   01/28/16 1100  gentamicin (GARAMYCIN) 320 mg in dextrose 5 % 100 mL IVPB     5 mg/kg  63 kg (Adjusted) 108 mL/hr over 60 Minutes Intravenous Every 24 hours 01/28/16 0604 01/30/16 1245   01/27/16 2359  clindamycin (CLEOCIN) IVPB 600 mg     600 mg 100 mL/hr over 30 Minutes Intravenous Every 8 hours 01/27/16 2253 01/30/16 1818   01/27/16 0600  clindamycin (CLEOCIN) IVPB 900 mg     900 mg 100 mL/hr over 30 Minutes Intravenous 30 min pre-op 01/26/16 1329 01/27/16 1700   01/26/16 1613  gentamicin (GARAMYCIN) 320 mg in dextrose 5 % 100 mL IVPB     5 mg/kg  63 kg (Adjusted) 216 mL/hr over 30 Minutes Intravenous 30 min pre-op 01/26/16 1613 01/27/16 1100   01/26/16 1254  clindamycin (CLEOCIN) IVPB 900 mg  Status:  Discontinued     900 mg 100 mL/hr over 30 Minutes Intravenous 30 min pre-op 01/26/16 1254 01/26/16 1329   01/26/16 1254  gentamicin (GARAMYCIN) IVPB 80 mg  Status:  Discontinued     80 mg 100 mL/hr over 30 Minutes Intravenous 30 min pre-op 01/26/16 1254 01/26/16 1327        Assessment/Plan High grade bladder cancer POD#6 robotic converted to open radical cystectomy with pelvic exenteration, hysterectomy, bil salingo-oophorectomy, total urethrectomy, pelvic lymphadenectomy, colon conduit urinary diversion---Dr. Tresa Moore  lysis of adhesions, sigmoid colectomy with resection of prior anastomosis, end colostomy--Dr. Dalbert Batman 01/27/16 -tolerating diet, ostomy functioning, pain controlled.  -WOC consult for ostomy teaching -mobilize, IS FEN-no issues VTE prophylaxis-SCD, lovenox ok from our standpoint Dispo-per primary  team  Erby Pian, ANP-BC [facility]   02/02/2016 9:19 AM

## 2016-02-03 LAB — GLUCOSE, CAPILLARY
GLUCOSE-CAPILLARY: 105 mg/dL — AB (ref 65–99)
GLUCOSE-CAPILLARY: 132 mg/dL — AB (ref 65–99)
Glucose-Capillary: 115 mg/dL — ABNORMAL HIGH (ref 65–99)
Glucose-Capillary: 118 mg/dL — ABNORMAL HIGH (ref 65–99)
Glucose-Capillary: 101 mg/dL — ABNORMAL HIGH (ref 65–99)

## 2016-02-03 NOTE — Progress Notes (Signed)
[facility]    [facility] is providing the following services: RW  If patient discharges after hours, please call [phone].   Linward Headland 02/03/2016, 3:35 PM

## 2016-02-03 NOTE — Progress Notes (Signed)
7 Days Post-Op  Subjective:   1 - High Grade Bladder Cancer  - s/p open radical cystectomy + TAH/BSO + total uretrhectomy + pelvic node dissection + colon conduit and end colostomy 01/27/16, for [id number] urothelial carcinoma with negative margins.  Stepdown 2/8. Transferred to med-surg floor 2/11. JP output minimal and removed 2/15.  2 - Extensive Abdominal Adhesions, H/o Colon Cancer - concomitant open segmetnal colon resection and end colostomy 01/27/16 for extensive colonic adhesions adherent to left ureter / bladder at time of cystectomy 01/27/16. Initially NPO / ice chips following surgery. Clears started 2/11 w good bowel sounds and no emesis. Adv to fulls 2/13. Adv to red 2/14 as copious gas/stool in ostomy.  3 - Acute Blood Loss Anemia - approx 1000 EBL surgery 2/8. Hgb initially stable at 9.3 and started SQ heparin proph. Hgb drop to 7.3 noted 2/10 and with tachycardia. Transfused 2uPRBC 01/29/16 with appropriate response back up to Hb of 9.3 and has remained stable.  4 - Disposition  - pt independent in all ADL's at baseline.  PT working with patient, rec rolling walker and OT consult. Rec 3x/week evaluation.   Today [name] continues to improve. No complaints.   Objective: Vital signs in last 24 hours: Temp:  [98.2 F (36.8 C)-98.5 F (36.9 C)] 98.5 F (36.9 C) (02/15 1504) Pulse Rate:  [84-86] 84 (02/15 1504) Resp:  [18] 18 (02/15 1504) BP: (140-169)/(62-69) 146/62 mmHg (02/15 1504) SpO2:  [94 %-95 %] 94 % (02/15 1504) Last BM Date: 01/26/16  Intake/Output from previous day: 02/14 0701 - 02/15 0700 In: 480 [P.O.:480] Out: 2770 [Urine:2550; Drains:15; Stool:205] Intake/Output this shift: Total I/O In: -  Out: 675 [Urine:675]  General appearance: alert, cooperative and appears stated age Ears: normal TM's and external ear canals both ears Nose: Nares normal. Septum midline. Mucosa normal. No drainage or sinus tenderness. Throat: lips, mucosa, and tongue normal; teeth and gums  normal Neck: supple, symmetrical, trachea midline Back: symmetric, no curvature. ROM normal. No CVA tenderness. Resp: non-labored on room air Cardio: Nl rate GI: soft, non-tender; bowel sounds normal; no masses,  no organomegaly Pelvic: external genitalia normal and minimal spotting.  Extremities: extremities normal, atraumatic, no cyanosis or edema Lymph nodes: Cervical, supraclavicular, and axillary nodes normal. Neurologic: Grossly normal Incision/Wound: RLQ Urostomy pink / patent with Bander stents in situ. LLQ Colostomy pink / patent with partially formed stool + gas in appliance.  Lab Results:   Recent Labs  02/01/16 0558  HGB 9.3*  HCT 28.2*   BMET  Recent Labs  02/01/16 0558  NA 137  K 4.2  CL 102  CO2 28  GLUCOSE 124*  BUN 11  CREATININE 0.61  CALCIUM 7.8*   PT/INR No results for input(s): LABPROT, INR in the last 72 hours. ABG No results for input(s): PHART, HCO3 in the last 72 hours.  Invalid input(s): PCO2, PO2  Studies/Results: No results found.  Anti-infectives: Anti-infectives    Start     Dose/Rate Route Frequency Ordered Stop   01/28/16 1100  gentamicin (GARAMYCIN) 320 mg in dextrose 5 % 100 mL IVPB     5 mg/kg  63 kg (Adjusted) 108 mL/hr over 60 Minutes Intravenous Every 24 hours 01/28/16 0604 01/30/16 1245   01/27/16 2359  clindamycin (CLEOCIN) IVPB 600 mg     600 mg 100 mL/hr over 30 Minutes Intravenous Every 8 hours 01/27/16 2253 01/30/16 1818   01/27/16 0600  clindamycin (CLEOCIN) IVPB 900 mg     900 mg 100  mL/hr over 30 Minutes Intravenous 30 min pre-op 01/26/16 1329 01/27/16 1700   01/26/16 1613  gentamicin (GARAMYCIN) 320 mg in dextrose 5 % 100 mL IVPB     5 mg/kg  63 kg (Adjusted) 216 mL/hr over 30 Minutes Intravenous 30 min pre-op 01/26/16 1613 01/27/16 1100   01/26/16 1254  clindamycin (CLEOCIN) IVPB 900 mg  Status:  Discontinued     900 mg 100 mL/hr over 30 Minutes Intravenous 30 min pre-op 01/26/16 1254 01/26/16 1329    01/26/16 1254  gentamicin (GARAMYCIN) IVPB 80 mg  Status:  Discontinued     80 mg 100 mL/hr over 30 Minutes Intravenous 30 min pre-op 01/26/16 1254 01/26/16 1327      Assessment/Plan:  1 - High Grade Bladder Cancer  - aggressive, metastatic disease. May benefit from adjuvant chemo in about 55mo pending clinical status, no further cancer-directed therapy in house.   2 - Extensive Abdominal Adhesions, H/o Colon Cancer - clinical ileus completely resolved. Continue carb mod diet.   3 - Acute Blood Loss Anemia -[facility], now stable.   4 - Disposition  - I feel pt OK [facility]    [facility] 02/03/2016

## 2016-02-03 NOTE — Progress Notes (Signed)
Occupational Therapy Treatment Patient Details Name: [name] MRN: [id number] DOB: [birth year] Today's Date: 02/03/2016    History of present illness  ^69 yo female with h/o colon cancer ,high grade bladder cancer admitted 01/26/16 for Cystoscopy, Robotic converted to open radical cystectomy , hysterectomy, total urethrectomy, bilateral pelvic lymphadenectomy. Colon conduit urinary diversion. Extensive laparoscopic adhesiolysis. Partial colectomy with end colostomy as performed 01/27/16,   OT comments  Pt very motivated to participate but limited by pain.    Follow Up Recommendations  Home health OT;Supervision/Assistance - 24 hour    Equipment Recommendations  None recommended by OT    Recommendations for Other Services      Precautions / Restrictions Precautions Precautions: Fall Restrictions Weight Bearing Restrictions: No       Mobility Bed Mobility     Rolling: Supervision Sidelying to sit: Mod assist       General bed mobility comments: assist for trunk.  When lying back down, assist for LEs  Transfers       Sit to Stand: Min assist         General transfer comment: Pt limited by pain     Balance                                   ADL                                         General ADL Comments: pt got to EOB with mod A (sidelying to sit; rolled with supervision and rails). Upon sitting, pain increased.  Stood to see if it would improve, but it didn't. Educated on reacher from bed level for adls.  Explained sock aide, but pt did not have this.  Husband present at beginning of session; he uses a rollator. Granddaughter, who is in college lives with them.  Pt would like OT to see her again prior to d/c.        Vision                     Perception     Praxis      Cognition   Behavior During Therapy: WFL for tasks assessed/performed Overall Cognitive Status: Within Functional Limits for tasks assessed                        Extremity/Trunk Assessment               Exercises     Shoulder Instructions       General Comments      Pertinent Vitals/ Pain       Pain Assessment: 0-10 Pain Score: 10-Worst pain ever (after getting to EOB) Pain Location: abdomen Pain Descriptors / Indicators: Aching Pain Intervention(s): Limited activity within patient's tolerance;Monitored during session;Repositioned;Patient requesting pain meds-RN notified;RN gave pain meds during session  Home Living                                          Prior Functioning/Environment              Frequency Min 2X/week     Progress Toward Goals  OT Goals(current goals can now be found in  the care plan section)  Progress towards OT goals: Not progressing toward goals - comment (due to pain this session)  Acute Rehab OT Goals Patient Stated Goal: to get home  OT Goal Formulation: With patient Time For Goal Achievement: 02/15/16  Plan      Co-evaluation                 End of Session     Activity Tolerance Patient limited by pain   Patient Left in bed;with call bell/phone within reach   Nurse Communication          Time: 8984-2103 OT Time Calculation (min): 32 min  Charges: OT General Charges $OT Visit: 1 Procedure OT Treatments $Therapeutic Activity: 8-22 mins  Rockney Grenz 02/03/2016, 3:30 PM  Lesle Chris, OTR/L [phone] 02/03/2016

## 2016-02-03 NOTE — Progress Notes (Signed)
Patient ID: [name], female   DOB: 1946-05-09, 70 y.o.   MRN: [id number]     [address] [place], [place] [address]    Phone: [phone] FAX: [phone]     Subjective: bp up. Afebrile.  No mobilization. Tolerating POs, ostomy functioning.    Objective:  Vital signs:  Filed Vitals:   02/02/16 0634 02/02/16 1440 02/02/16 2232 02/03/16 0627  BP: 139/65 143/51 140/65 169/69  Pulse: 81 89 86 86  Temp: 98.6 F (37 C) 98 F (36.7 C) 98.2 F (36.8 C) 98.2 F (36.8 C)  TempSrc: Oral Oral Oral Oral  Resp: '17 19 18 18  '$ Height:      Weight:      SpO2: 97% 94% 95% 95%    Last BM Date: 01/26/16  Intake/Output   Yesterday:  02/14 0701 - 02/15 0700 In: 480 [P.O.:480] Out: 2770 [Urine:2550; Drains:15; Stool:205] This shift:     Physical Exam: General: Pt awake/alert/oriented x4 in no acute distress Abdomen: Soft. Nondistended. appropriately tender. Incisions is dry. llq ostomy is functioning. jp drain with serosanguinous output. ilioconduit rlq.    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    Results:   Labs: Results for orders placed or performed during the hospital encounter of 01/26/16 (from the past 48 hour(s))  Glucose, capillary     Status: Abnormal   Collection Time: 02/01/16 12:09 PM  Result Value Ref Range   Glucose-Capillary 112 (H) 65 - 99 mg/dL  Glucose, capillary     Status: Abnormal   Collection Time: 02/01/16  5:18 PM  Result Value Ref Range   Glucose-Capillary 105 (H) 65 - 99 mg/dL  Glucose, capillary     Status: None   Collection Time: 02/01/16  9:44 PM  Result Value Ref Range   Glucose-Capillary 97 65 - 99 mg/dL  Glucose, capillary     Status: None   Collection Time: 02/02/16  7:38 AM  Result Value Ref Range   Glucose-Capillary 97 65 - 99 mg/dL  Glucose, capillary     Status: None   Collection Time: 02/02/16 11:46 AM  Result Value Ref Range   Glucose-Capillary 87 65 - 99 mg/dL  Glucose, capillary     Status: None   Collection Time: 02/02/16  5:25 PM  Result Value Ref Range   Glucose-Capillary 96 65 - 99 mg/dL  Glucose, capillary     Status: Abnormal   Collection Time: 02/02/16  9:59 PM  Result Value Ref Range   Glucose-Capillary 105 (H) 65 - 99 mg/dL  Glucose, capillary     Status: Abnormal   Collection Time: 02/03/16  7:41 AM  Result Value Ref Range   Glucose-Capillary 115 (H) 65 - 99 mg/dL    Imaging / Studies: No results found.  Medications / Allergies:  Scheduled Meds: . acetaminophen  1,000 mg Oral TID  . antiseptic oral rinse  7 mL Mouth Rinse q12n4p  . chlorhexidine  15 mL Mouth Rinse BID  . insulin aspart  0-5 Units Subcutaneous QHS  . insulin aspart  0-9 Units Subcutaneous TID WC  . lip balm  1 application Topical BID  . senna-docusate  1 tablet Oral BID   Continuous Infusions:  PRN Meds:.alum & mag hydroxide-simeth, HYDROmorphone (DILAUDID) injection, magic mouthwash, menthol-cetylpyridinium, methocarbamol (ROBAXIN)  IV, oxyCODONE-acetaminophen,  phenol  Antibiotics: Anti-infectives    Start     Dose/Rate Route Frequency Ordered Stop   01/28/16 1100  gentamicin (GARAMYCIN) 320 mg in dextrose 5 % 100 mL IVPB     5 mg/kg  63 kg (Adjusted) 108 mL/hr over 60 Minutes Intravenous Every 24 hours 01/28/16 0604 01/30/16 1245   01/27/16 2359  clindamycin (CLEOCIN) IVPB 600 mg     600 mg 100 mL/hr over 30 Minutes Intravenous Every 8 hours 01/27/16 2253 01/30/16 1818   01/27/16 0600  clindamycin (CLEOCIN) IVPB 900 mg     900 mg 100 mL/hr over 30 Minutes Intravenous 30 min pre-op 01/26/16 1329 01/27/16 1700   01/26/16 1613  gentamicin (GARAMYCIN) 320 mg in dextrose 5 % 100 mL IVPB     5 mg/kg  63 kg (Adjusted) 216 mL/hr over 30 Minutes Intravenous 30 min pre-op 01/26/16 1613  01/27/16 1100   01/26/16 1254  clindamycin (CLEOCIN) IVPB 900 mg  Status:  Discontinued     900 mg 100 mL/hr over 30 Minutes Intravenous 30 min pre-op 01/26/16 1254 01/26/16 1329   01/26/16 1254  gentamicin (GARAMYCIN) IVPB 80 mg  Status:  Discontinued     80 mg 100 mL/hr over 30 Minutes Intravenous 30 min pre-op 01/26/16 1254 01/26/16 1327         Assessment/Plan High grade bladder cancer POD#7 robotic converted to open radical cystectomy with pelvic exenteration, hysterectomy, bil salingo-oophorectomy, total urethrectomy, pelvic lymphadenectomy, colon conduit urinary diversion---Dr. Tresa Moore  lysis of adhesions, sigmoid colectomy with resection of prior anastomosis, end colostomy--Dr. Dalbert Batman 01/27/16 -tolerating diet, ostomy functioning, pain controlled.  -WOC consult for ostomy teaching -unfortunately, she is not being ambulated.  D/w nursing, replaced PT order.  Pt was ambulatory prior to admission.  FEN-no issues VTE prophylaxis-SCD, lovenox ok from our standpoint Dispo-per primary team, stable from [place], [facility] Pager [phone](7A-4:30P)   02/03/2016 9:15 AM

## 2016-02-03 NOTE — Consult Note (Signed)
WOC ostomy follow up Stoma type/location: RUQ, ileal conduit; two stents intact Treatment options for stomal/peristomal skin: skin barrier ring used with Monday pouch change Output yellow urine Ostomy pouching: 1pc convex with barrier ring in place, intact draining well  Education provided:  Demonstrated connecting pouch to BSD and disconnecting several times for this patient.  Explained to patient how to open and close spigot.   Enrolled patient in [place] Discharge program: yes Samples sent of 1pc convex transparent and beige as well as 2" barrier rings.  WOC ostomy follow up Stoma type/location: LUQ, colostomy Treatment options for stomal/peristomal skin: skin barrier used on Monday Output liquid, green/brown stool, just emptied per staff Ostomy pouching: 2pc. 2 1/4 in place, intact  Education provided: demonstrated lock and roll closure and cleaning spout after emptying.   Explained to patient to have staff allow her to empty the next time the pouch fills.  Enrolled patient in [place] Discharge program: Yes Samples sent of 2 pc 2 1/4 transparent and beige as well as 2" barrier rings.   [place] team will follow along with you for support with ostomies.  Cannon Arreola [place] RN,CWOCN [phone]

## 2016-02-04 LAB — GLUCOSE, CAPILLARY
GLUCOSE-CAPILLARY: 126 mg/dL — AB (ref 65–99)
Glucose-Capillary: 149 mg/dL — ABNORMAL HIGH (ref 65–99)
Glucose-Capillary: 132 mg/dL — ABNORMAL HIGH (ref 65–99)
Glucose-Capillary: 111 mg/dL — ABNORMAL HIGH (ref 65–99)

## 2016-02-04 MED ORDER — KETOROLAC TROMETHAMINE 10 MG PO TABS
10.0000 mg | ORAL_TABLET | Freq: Three times a day (TID) | ORAL | Status: DC
  Administered 2016-02-04 – 2016-02-05 (×4): 10 mg via ORAL
  Filled 2016-02-04 (×10): qty 1

## 2016-02-04 NOTE — Consult Note (Addendum)
WOC ostomy follow up Stoma type/location: RUQ Colon conduit with two stents intact Stomal assessment/size: 1 and 5/8 inch oval, red, moist, budded. Slight depression at 10-11 o'clock Peristomal assessment: See above. Clear with brown staining from 8-10 o'clock. Treatment options for stomal/peristomal skin: none.  Convex urinary pouch Output Clear yellow urine Ostomy pouching: 1pc.convex urinary pouch. Education provided: reviewed disconnecting from bedside urinary drainage bag and flipping spigot to close pouch. Enrolled patient in [place] Start Discharge program: No   WOC ostomy follow up Stoma type/location: LUQ colostomy Stomal assessment/size: 1 and 3/4 inches oval, dark mucosa covers 75% of ostomy, 25% red Peristomal assessment: intact, clear (Back of barrier indicated today would have had leakage if pouching system not changed today) Treatment options for stomal/peristomal skin: skin barrier ring Output gas, mucus, no stool Ostomy pouching: 2pc.with ring Education provided: Patient reviews cleaning of pouch after emptying and will practice this today with RN using toilet paper "wicks" to clean the bottom two inches of the tail closure.  RN is asked to document when patient performs independently. Enrolled patient in [place] Start Discharge program: No  WOC nursing team will follow, and will remain available to this patient, the nursing and medical teams.   Thanks, Maudie Flakes, MSN, RN, [place], Arther Abbott  Pager# [phone]

## 2016-02-04 NOTE — Progress Notes (Signed)
Occupational Therapy Treatment Patient Details Name: [name] MRN: [id number] DOB: 1946/08/08 Today's Date: 02/04/2016    History of present illness  ^69 yo female with h/o colon cancer ,high grade bladder cancer admitted 01/26/16 for Cystoscopy, Robotic converted to open radical cystectomy , hysterectomy, total urethrectomy, bilateral pelvic lymphadenectomy. Colon conduit urinary diversion. Extensive laparoscopic adhesiolysis. Partial colectomy with end colostomy as performed 01/27/16,   OT comments  Tolerated much better today.  Pt would benefit from hospital bed at home for decreased pain and increased independence with bed mobility. She needs split rails to assist herself  Follow Up Recommendations  Home health OT;Supervision/Assistance - 24 hour    Equipment Recommendations  Hospital bed (with split rails)    Recommendations for Other Services      Precautions / Restrictions Precautions Precautions: Fall Restrictions Weight Bearing Restrictions: No       Mobility Bed Mobility               General bed mobility comments: supine to sit with HOB raised, min A.  Rolled to L with min A but too painful to continue  Transfers       Sit to Stand: Min guard         General transfer comment: cues for UE placement    Balance                                   ADL               Lower Body Bathing: Maximal assistance;Sit to/from stand           Toilet Transfer: Min guard;Stand-pivot;RW (Psychologist, occupational)             General ADL Comments: completed ADL from chair sit to stand. Did not use reacher today as pt fatiqued very easily.  Pt could not tolerate rolling, sidelying to sit.  Requested HOB up, and she performed getting to EOB with much less pain      Vision                     Perception     Praxis      Cognition   Behavior During Therapy: WFL for tasks assessed/performed Overall Cognitive Status: Within Functional  Limits for tasks assessed                       Extremity/Trunk Assessment               Exercises     Shoulder Instructions       General Comments      Pertinent Vitals/ Pain       Pain Score: 4  Pain Location: abdomen/side, except increased to 8 when rolling/standing Pain Descriptors / Indicators: Sore Pain Intervention(s): Limited activity within patient's tolerance;Monitored during session;Premedicated before session;Repositioned  Home Living                                          Prior Functioning/Environment              Frequency Min 2X/week     Progress Toward Goals  OT Goals(current goals can now be found in the care plan section)  Progress towards OT goals: Progressing toward goals     Plan  Co-evaluation                 End of Session     Activity Tolerance Patient limited by fatigue   Patient Left in chair;with call bell/phone within reach;with chair alarm set   Nurse Communication          Time: [phone] OT Time Calculation (min): 34 min  Charges: OT General Charges $OT Visit: 1 Procedure OT Treatments $Self Care/Home Management : 23-37 mins  Tayven Renteria 02/04/2016, 10:03 AM   Lesle Chris, OTR/L [phone] 02/04/2016

## 2016-02-04 NOTE — Progress Notes (Signed)
Patient ID: [name], female   DOB: October 25, 1946, 70 y.o.   MRN: [id number]     [facility]      [address] [place], [place] [address]    Phone: [phone] FAX: [phone]     Subjective: C/o left sided hip started early this AM.  No previous symptoms.  Radiates down her leg.  No trauma. Walked x2 yesterday.  VSS.  Afebrile. Tolerating POs.   Objective:  Vital signs:  Filed Vitals:   02/03/16 0627 02/03/16 1504 02/03/16 2313 02/04/16 0634  BP: 169/69 146/62 161/63 148/56  Pulse: 86 84 86 67  Temp: 98.2 F (36.8 C) 98.5 F (36.9 C) 98.2 F (36.8 C) 98.4 F (36.9 C)  TempSrc: Oral Oral Oral Oral  Resp: '18 18 18 18  '$ Height:      Weight:      SpO2: 95% 94% 97% 97%    Last BM Date: 01/26/16  Intake/Output   Yesterday:  02/15 0701 - 02/16 0700 In: 180 [P.O.:180] Out: 1685 [Urine:1675; Stool:10] This shift:     Physical Exam: General: Pt awake/alert/oriented x4 in no acute distress Abdomen: Soft. Nondistended. appropriately tender. midline dressing removed, staples in place, no erythema, wound edges are approximated. llq ostomy is functioning. jp drain with serosanguinous output. ilioconduit rlq.    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    Results:   Labs: Results for orders placed or performed during the hospital encounter of 01/26/16 (from the past 48 hour(s))  Glucose, capillary     Status: None   Collection Time: 02/02/16 11:46 AM  Result Value Ref Range   Glucose-Capillary 87 65 - 99 mg/dL  Glucose, capillary     Status: None   Collection Time: 02/02/16  5:25 PM  Result Value Ref Range   Glucose-Capillary 96 65 - 99 mg/dL  Glucose, capillary     Status: Abnormal   Collection Time: 02/02/16  9:59 PM  Result Value Ref Range   Glucose-Capillary  105 (H) 65 - 99 mg/dL  Glucose, capillary     Status: Abnormal   Collection Time: 02/03/16  7:41 AM  Result Value Ref Range   Glucose-Capillary 115 (H) 65 - 99 mg/dL  Glucose, capillary     Status: Abnormal   Collection Time: 02/03/16 12:26 PM  Result Value Ref Range   Glucose-Capillary 118 (H) 65 - 99 mg/dL  Glucose, capillary     Status: Abnormal   Collection Time: 02/03/16  5:19 PM  Result Value Ref Range   Glucose-Capillary 101 (H) 65 - 99 mg/dL  Glucose, capillary     Status: Abnormal   Collection Time: 02/03/16 10:10 PM  Result Value Ref Range   Glucose-Capillary 132 (H) 65 - 99 mg/dL  Glucose, capillary     Status: Abnormal   Collection Time: 02/04/16  7:43 AM  Result Value Ref Range   Glucose-Capillary 126 (H) 65 - 99 mg/dL    Imaging / Studies: No results found.  Medications / Allergies:  Scheduled Meds: . antiseptic oral rinse  7 mL Mouth Rinse q12n4p  . chlorhexidine  15 mL Mouth Rinse BID  . insulin aspart  0-5 Units Subcutaneous QHS  . insulin aspart  0-9 Units Subcutaneous TID WC  . ketorolac  10 mg Oral Q8H  . lip balm  1 application Topical BID  .  senna-docusate  1 tablet Oral BID   Continuous Infusions:  PRN Meds:.alum & mag hydroxide-simeth, HYDROmorphone (DILAUDID) injection, magic mouthwash, menthol-cetylpyridinium, methocarbamol (ROBAXIN)  IV, oxyCODONE-acetaminophen, phenol  Antibiotics: Anti-infectives    Start     Dose/Rate Route Frequency Ordered Stop   01/28/16 1100  gentamicin (GARAMYCIN) 320 mg in dextrose 5 % 100 mL IVPB     5 mg/kg  63 kg (Adjusted) 108 mL/hr over 60 Minutes Intravenous Every 24 hours 01/28/16 0604 01/30/16 1245   01/27/16 2359  clindamycin (CLEOCIN) IVPB 600 mg     600 mg 100 mL/hr over 30 Minutes Intravenous Every 8 hours 01/27/16 2253 01/30/16 1818   01/27/16 0600  clindamycin (CLEOCIN) IVPB 900 mg     900 mg 100 mL/hr over 30 Minutes Intravenous 30 min pre-op 01/26/16 1329 01/27/16 1700   01/26/16 1613  gentamicin  (GARAMYCIN) 320 mg in dextrose 5 % 100 mL IVPB     5 mg/kg  63 kg (Adjusted) 216 mL/hr over 30 Minutes Intravenous 30 min pre-op 01/26/16 1613 01/27/16 1100   01/26/16 1254  clindamycin (CLEOCIN) IVPB 900 mg  Status:  Discontinued     900 mg 100 mL/hr over 30 Minutes Intravenous 30 min pre-op 01/26/16 1254 01/26/16 1329   01/26/16 1254  gentamicin (GARAMYCIN) IVPB 80 mg  Status:  Discontinued     80 mg 100 mL/hr over 30 Minutes Intravenous 30 min pre-op 01/26/16 1254 01/26/16 1327        Assessment/Plan High grade bladder cancer POD#8 robotic converted to open radical cystectomy with pelvic exenteration, hysterectomy, bil salingo-oophorectomy, total urethrectomy, pelvic lymphadenectomy, colon conduit urinary diversion---Dr. Tresa Moore  lysis of adhesions, sigmoid colectomy with resection of prior anastomosis, end colostomy--Dr. Dalbert Batman 01/27/16 -tolerating diet, ostomy functioning, pain controlled.  -WOC consult for ostomy teaching -mobilize with floor staff and PT FEN-no issues VTE prophylaxis-SCD, lovenox ok from our standpoint Dispo-per primary team, stable from [place], ANP-BC [facility]   02/04/2016 8:51 AM

## 2016-02-04 NOTE — Progress Notes (Signed)
Physical Therapy Treatment Patient Details Name: [name] MRN: [id number] DOB: [birth year] Today's Date: 02-13-16    History of Present Illness  ^69 yo female with h/o colon cancer ,high grade bladder cancer admitted 01/26/16 for Cystoscopy, Robotic converted to open radical cystectomy , hysterectomy, total urethrectomy, bilateral pelvic lymphadenectomy. Colon conduit urinary diversion. Extensive laparoscopic adhesiolysis. Partial colectomy with end colostomy as performed 01/27/16,    PT Comments    Pt reports pain better today however 4/10 L groin pain present with ambulation.  Pt's mobility and ambulation distance is improving.  Follow Up Recommendations  Home health PT;Supervision/Assistance - 24 hour     Equipment Recommendations  Rolling walker with 5" wheels    Recommendations for Other Services       Precautions / Restrictions Precautions Precautions: Fall Precaution Comments: colostomy, urostomy     Mobility  Bed Mobility Overal bed mobility: Needs Assistance Bed Mobility: Rolling;Sidelying to Sit;Sit to Supine Rolling: Supervision Sidelying to sit: Supervision   Sit to supine: Supervision   General bed mobility comments: HOB raised, pt uses bed rails to self assist  Transfers Overall transfer level: Needs assistance Equipment used: Rolling walker (2 wheeled) Transfers: Sit to/from Stand Sit to Stand: Min guard            Ambulation/Gait Ambulation/Gait assistance: Min guard Ambulation Distance (Feet): 120 Feet Assistive device: Rolling walker (2 wheeled) Gait Pattern/deviations: Step-through pattern;Decreased stride length;Trunk flexed     General Gait Details: distance to tolerance, pt reports L groin pain increasing to 4/10 during gait   Stairs            Wheelchair Mobility    Modified Rankin (Stroke Patients Only)       Balance                                    Cognition Arousal/Alertness:  Awake/alert Behavior During Therapy: WFL for tasks assessed/performed Overall Cognitive Status: Within Functional Limits for tasks assessed                      Exercises      General Comments        Pertinent Vitals/Pain Pain Assessment: 0-10 Pain Score: 4  Pain Location: L groin Pain Descriptors / Indicators: Sharp Pain Intervention(s): Limited activity within patient's tolerance;Monitored during session;Repositioned    Home Living                      Prior Function            PT Goals (current goals can now be found in the care plan section) Progress towards PT goals: Progressing toward goals    Frequency  Min 3X/week    PT Plan Current plan remains appropriate    Co-evaluation             End of Session   Activity Tolerance: Patient tolerated treatment well Patient left: in bed;with call bell/phone within reach     Time: 1451-1503 PT Time Calculation (min) (ACUTE ONLY): 12 min  Charges:  $Gait Training: 8-22 mins                    G Codes:      Merna Baldi,KATHrine E 2016-02-13, 3:23 PM Carmelia Bake, PT, DPT 2016/02/13 Pager: [phone]

## 2016-02-04 NOTE — Progress Notes (Signed)
8 Days Post-Op  Subjective:   1 - High Grade Bladder Cancer  - s/p open radical cystectomy + TAH/BSO + total uretrhectomy + pelvic node dissection + colon conduit and end colostomy 01/27/16, for pT4aN2Mx urothelial carcinoma with negative margins.  Stepdown 2/8. Transferred to med-surg floor 2/11. JP output minimal and removed 2/15.  2 - Extensive Abdominal Adhesions, H/o Colon Cancer - concomitant open segmetnal colon resection and end colostomy 01/27/16 for extensive colonic adhesions adherent to left ureter / bladder at time of cystectomy 01/27/16. Initially NPO / ice chips following surgery. Clears started 2/11 w good bowel sounds and no emesis. Adv to fulls 2/13. Adv to red 2/14 as copious gas/stool in ostomy.  3 - Acute Blood Loss Anemia - approx 1000 EBL surgery 2/8. Hgb initially stable at 9.3 and started SQ heparin proph. Hgb drop to 7.3 noted 2/10 and with tachycardia. Transfused 2uPRBC 01/29/16 with appropriate response back up to Hb of 9.3 and has remained stable.  4 - Left Back / Hip Pain / Lumbago - new sharp shooting discomfort with bending / twisting after becoming more ambulatory 2/15. No relation to eating. No fevers. Starting PO ketorolac 2/16.   5 - Disposition  - pt independent in all ADL's at baseline.  PT working with patient, rec rolling walker and OT consult. Rec 3x/week evaluation.   Today [name] continues to improve. Had some very bothersome Lt hip / back pain with movement that is sharp, limiting mobility some this AM. No fevers / nausea / wound problems.   Objective: Vital signs in last 24 hours: Temp:  [98.2 F (36.8 C)-98.5 F (36.9 C)] 98.4 F (36.9 C) (02/16 0634) Pulse Rate:  [67-86] 67 (02/16 0634) Resp:  [18] 18 (02/16 0634) BP: (146-161)/(56-63) 148/56 mmHg (02/16 0634) SpO2:  [94 %-97 %] 97 % (02/16 0634) Last BM Date: 01/26/16  Intake/Output from previous day: 02/15 0701 - 02/16 0700 In: 180 [P.O.:180] Out: 675 [Urine:675] Intake/Output this shift:     General appearance: alert, cooperative and appears stated age Eyes: negative Nose: Nares normal. Septum midline. Mucosa normal. No drainage or sinus tenderness. Throat: lips, mucosa, and tongue normal; teeth and gums normal Neck: supple, symmetrical, trachea midline Back: symmetric, no curvature. ROM normal. No CVA tenderness. Resp: non-labored on room air Cardio: Nl rate GI: soft, non-tender; bowel sounds normal; no masses,  no organomegaly Female genitalia: no vaginal spotting Extremities: extremities normal, atraumatic, no cyanosis or edema Lymph nodes: Cervical, supraclavicular, and axillary nodes normal. Neurologic: Grossly normal Incision/Wound: midline incision c/d/i with staples in place. LLQ colostomy pink / patent with coious gas + stool in appliance. RLQ urostomy pink / patent with Bander stents in situ. No LLQ poitn TTP / rebound / guarding.   Lab Results:  No results for input(s): WBC, HGB, HCT, PLT in the last 72 hours. BMET No results for input(s): NA, K, CL, CO2, GLUCOSE, BUN, CREATININE, CALCIUM in the last 72 hours. PT/INR No results for input(s): LABPROT, INR in the last 72 hours. ABG No results for input(s): PHART, HCO3 in the last 72 hours.  Invalid input(s): PCO2, PO2  Studies/Results: No results found.  Anti-infectives: Anti-infectives    Start     Dose/Rate Route Frequency Ordered Stop   01/28/16 1100  gentamicin (GARAMYCIN) 320 mg in dextrose 5 % 100 mL IVPB     5 mg/kg  63 kg (Adjusted) 108 mL/hr over 60 Minutes Intravenous Every 24 hours 01/28/16 0604 01/30/16 1245   01/27/16 2359  clindamycin (  CLEOCIN) IVPB 600 mg     600 mg 100 mL/hr over 30 Minutes Intravenous Every 8 hours 01/27/16 2253 01/30/16 1818   01/27/16 0600  clindamycin (CLEOCIN) IVPB 900 mg     900 mg 100 mL/hr over 30 Minutes Intravenous 30 min pre-op 01/26/16 1329 01/27/16 1700   01/26/16 1613  gentamicin (GARAMYCIN) 320 mg in dextrose 5 % 100 mL IVPB     5 mg/kg  63 kg  (Adjusted) 216 mL/hr over 30 Minutes Intravenous 30 min pre-op 01/26/16 1613 01/27/16 1100   01/26/16 1254  clindamycin (CLEOCIN) IVPB 900 mg  Status:  Discontinued     900 mg 100 mL/hr over 30 Minutes Intravenous 30 min pre-op 01/26/16 1254 01/26/16 1329   01/26/16 1254  gentamicin (GARAMYCIN) IVPB 80 mg  Status:  Discontinued     80 mg 100 mL/hr over 30 Minutes Intravenous 30 min pre-op 01/26/16 1254 01/26/16 1327      Assessment/Plan:  1 - High Grade Bladder Cancer  - aggressive, metastatic disease. May benefit from adjuvant chemo in about 70mo pending clinical status, no further cancer-directed therapy in house.   2 - Extensive Abdominal Adhesions, H/o Colon Cancer - clinical ileus completely resolved. Continue carb mod diet.   3 - Acute Blood Loss Anemia -[facility], now stable.   4 - Left Back / Hip Pain / Lumbago - begin scheduled PO ketorolac for likely sciatica based on history and exam, encouraged ambulation if possible, but not so much as to aggravate.   5 - Disposition  - I feel pt OK [name] as long as remains ambulatory.   [facility] Orvetta Danielski 02/04/2016

## 2016-02-05 LAB — GLUCOSE, CAPILLARY: Glucose-Capillary: 117 mg/dL — ABNORMAL HIGH (ref 65–99)

## 2016-02-05 MED ORDER — OXYCODONE-ACETAMINOPHEN 5-325 MG PO TABS: 1.0000 | ORAL_TABLET | Freq: Four times a day (QID) | ORAL | Status: AC | PRN

## 2016-02-05 MED ORDER — KETOROLAC TROMETHAMINE 10 MG PO TABS: 10.0000 mg | ORAL_TABLET | Freq: Three times a day (TID) | ORAL | Status: DC | PRN

## 2016-02-05 NOTE — Progress Notes (Signed)
Patient ID: [name], female   DOB: Dec 20, 1945, 70 y.o.   MRN: [id number]      [address] [place], [place] [address]    Phone: [phone] FAX: [phone]     Subjective: No air in bag since yesterday, but air present throughout.  No n.v.  Tolerating POs.  Hip pain is better.  Afebrile.  VSS  Objective:  Vital signs:  Filed Vitals:   02/04/16 0634 02/04/16 1442 02/04/16 2043 02/05/16 0452  BP: 148/56 128/52 141/55 161/66  Pulse: 67 82 81 86  Temp: 98.4 F (36.9 C) 97.6 F (36.4 C) 98.2 F (36.8 C) 98.2 F (36.8 C)  TempSrc: Oral Oral Oral Oral  Resp: '18 18 18 18  '$ Height:      Weight:      SpO2: 97% 96% 98% 99%    Last BM Date: 01/26/16  Intake/Output   Yesterday:  02/16 0701 - 02/17 0700 In: 960 [P.O.:960] Out: 600 [Urine:600] This shift:    Physical Exam: General: Pt awake/alert/oriented x4 in no acute distress Abdomen: Soft. Nondistended. appropriately tender. llq ostomy is-air no stool.   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    Results:   Labs: Results for orders placed or performed during the hospital encounter of 01/26/16 (from the past 48 hour(s))  Glucose, capillary     Status: Abnormal   Collection Time: 02/03/16 12:26 PM  Result Value Ref Range   Glucose-Capillary 118 (H) 65 - 99 mg/dL  Glucose, capillary     Status: Abnormal   Collection Time: 02/03/16  5:19 PM  Result Value Ref Range   Glucose-Capillary 101 (H) 65 - 99 mg/dL  Glucose, capillary     Status: Abnormal   Collection Time: 02/03/16 10:10 PM  Result Value Ref Range   Glucose-Capillary 132 (H) 65 - 99 mg/dL  Glucose, capillary     Status: Abnormal   Collection Time: 02/04/16  7:43 AM  Result Value Ref Range   Glucose-Capillary 126 (H) 65 - 99 mg/dL   Glucose, capillary     Status: Abnormal   Collection Time: 02/04/16 11:59 AM  Result Value Ref Range   Glucose-Capillary 149 (H) 65 - 99 mg/dL  Glucose, capillary     Status: Abnormal   Collection Time: 02/04/16  4:42 PM  Result Value Ref Range   Glucose-Capillary 132 (H) 65 - 99 mg/dL  Glucose, capillary     Status: Abnormal   Collection Time: 02/04/16  8:42 PM  Result Value Ref Range   Glucose-Capillary 111 (H) 65 - 99 mg/dL  Glucose, capillary     Status: Abnormal   Collection Time: 02/05/16  7:33 AM  Result Value Ref Range   Glucose-Capillary 117 (H) 65 - 99 mg/dL    Imaging / Studies: No results found.  Medications / Allergies:  Scheduled Meds: . antiseptic oral rinse  7 mL Mouth Rinse q12n4p  . chlorhexidine  15 mL Mouth Rinse BID  . insulin aspart  0-5 Units Subcutaneous QHS  . insulin aspart  0-9 Units Subcutaneous TID WC  . ketorolac  10 mg Oral Q8H  . lip balm  1 application Topical BID  . senna-docusate  1 tablet Oral BID   Continuous Infusions:  PRN Meds:.alum & mag hydroxide-simeth, HYDROmorphone (DILAUDID) injection, magic mouthwash, menthol-cetylpyridinium,  methocarbamol (ROBAXIN)  IV, oxyCODONE-acetaminophen, phenol  Antibiotics: Anti-infectives    Start     Dose/Rate Route Frequency Ordered Stop   01/28/16 1100  gentamicin (GARAMYCIN) 320 mg in dextrose 5 % 100 mL IVPB     5 mg/kg  63 kg (Adjusted) 108 mL/hr over 60 Minutes Intravenous Every 24 hours 01/28/16 0604 01/30/16 1245   01/27/16 2359  clindamycin (CLEOCIN) IVPB 600 mg     600 mg 100 mL/hr over 30 Minutes Intravenous Every 8 hours 01/27/16 2253 01/30/16 1818   01/27/16 0600  clindamycin (CLEOCIN) IVPB 900 mg     900 mg 100 mL/hr over 30 Minutes Intravenous 30 min pre-op 01/26/16 1329 01/27/16 1700   01/26/16 1613  gentamicin (GARAMYCIN) 320 mg in dextrose 5 % 100 mL IVPB     5 mg/kg  63 kg (Adjusted) 216 mL/hr over 30 Minutes Intravenous 30 min pre-op 01/26/16 1613 01/27/16 1100    01/26/16 1254  clindamycin (CLEOCIN) IVPB 900 mg  Status:  Discontinued     900 mg 100 mL/hr over 30 Minutes Intravenous 30 min pre-op 01/26/16 1254 01/26/16 1329   01/26/16 1254  gentamicin (GARAMYCIN) IVPB 80 mg  Status:  Discontinued     80 mg 100 mL/hr over 30 Minutes Intravenous 30 min pre-op 01/26/16 1254 01/26/16 1327        Assessment/Plan High grade bladder cancer POD#9 robotic converted to open radical cystectomy with pelvic exenteration, hysterectomy, bil salingo-oophorectomy, total urethrectomy, pelvic lymphadenectomy, colon conduit urinary diversion---Dr. Tresa Moore  lysis of adhesions, sigmoid colectomy with resection of prior anastomosis, end colostomy--Dr. Dalbert Batman 01/27/16 -tolerating diet, ostomy functioning, pain controlled.  -HH arranged  FEN-no issues VTE prophylaxis-SCD, lovenox ok from our standpoint Dispo-per primary team, stable from [place] standpoint.  Follow up scheduled.    Erby Pian, [facility] Surgery Pager [phone](7A-4:30P)   02/05/2016 9:25 AM

## 2016-02-05 NOTE — Progress Notes (Signed)
Spoke with pt's husband concerning HH.  [facility] and referral given.  [facility] Insurance verification for pt's co pay.  Marland Kitchen

## 2016-02-05 NOTE — Progress Notes (Signed)
Physical Therapy Treatment Patient Details Name: [name] MRN: [id number] DOB: 05-Apr-1946 Today's Date: 03-06-2016    History of Present Illness  70 yo female with h/o colon cancer ,high grade bladder cancer admitted 01/26/16 for Cystoscopy, Robotic converted to open radical cystectomy, hysterectomy, total urethrectomy, bilateral pelvic lymphadenectomy. Colon conduit urinary diversion. Extensive laparoscopic adhesiolysis. Partial colectomy with end colostomy as performed 01/27/16,    PT Comments    Pt improving with mobility and reports d/c home today.  Follow Up Recommendations  Home health PT;Supervision/Assistance - 24 hour     Equipment Recommendations  Rolling walker with 5" wheels    Recommendations for Other Services       Precautions / Restrictions Precautions Precautions: Fall Precaution Comments: colostomy, urostomy     Mobility  Bed Mobility Overal bed mobility: Needs Assistance Bed Mobility: Rolling;Sidelying to Sit;Sit to Supine Rolling: Supervision Sidelying to sit: Supervision   Sit to supine: Supervision   General bed mobility comments: HOB raised, pt uses bed rails to self assist  Transfers Overall transfer level: Needs assistance Equipment used: Rolling walker (2 wheeled) Transfers: Sit to/from Stand Sit to Stand: Min guard            Ambulation/Gait Ambulation/Gait assistance: Min guard Ambulation Distance (Feet): 200 Feet Assistive device: Rolling walker (2 wheeled) Gait Pattern/deviations: Step-through pattern;Decreased stride length;Trunk flexed     General Gait Details: distance to tolerance, required seated rest break due to fatigue, HR 108 bpm during gait   Stairs            Wheelchair Mobility    Modified Rankin (Stroke Patients Only)       Balance                                    Cognition Arousal/Alertness: Awake/alert Behavior During Therapy: WFL for tasks assessed/performed Overall  Cognitive Status: Within Functional Limits for tasks assessed                      Exercises      General Comments        Pertinent Vitals/Pain Pain Assessment: 0-10 Pain Score: 3  Pain Location: L groin Pain Descriptors / Indicators: Dull (not sharp today) Pain Intervention(s): Limited activity within patient's tolerance;Monitored during session;Premedicated before session    Home Living                      Prior Function            PT Goals (current goals can now be found in the care plan section) Progress towards PT goals: Progressing toward goals    Frequency  Min 3X/week    PT Plan Current plan remains appropriate    Co-evaluation             End of Session   Activity Tolerance: Patient tolerated treatment well Patient left: in bed;with call bell/phone within reach;with bed alarm set     Time: 5465-0354 PT Time Calculation (min) (ACUTE ONLY): 13 min  Charges:  $Gait Training: 8-22 mins                    G Codes:      Jadden Yim,KATHrine E 06-Mar-2016, 1:09 PM Carmelia Bake, PT, DPT 2016-03-06 Pager: [phone]

## 2016-02-05 NOTE — Progress Notes (Signed)
Patient demonstrated how to empty her ileostomy bag and her colostomy bag. She demonstrated how to clean both bags. [place] set up with patient. Case manager confirmed home health was ready for discharge. Completed discharge teaching with patient and family. Pt stated and demonstrated her understanding. Prescriptions given. Patient will be discharged home with family in stable condition.

## 2016-02-05 NOTE — Discharge Summary (Signed)
Physician Discharge Summary  Patient ID: [name] MRN: [id number] DOB/AGE: 70-Oct-1947 70 y.o.  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 Stepdown 2/8. Transferred to med-surg floor 2/11. JP output minimal and removed 2/15.  2 - Extensive Abdominal Adhesions, H/o Colon Cancer - concomitant open segmetnal colon resection and end colostomy 01/27/16 for extensive colonic adhesions adherent to left ureter / bladder at time of cystectomy 01/27/16. Initially NPO / ice chips following surgery. Clears started 2/11 w good bowel sounds and no emesis. Adv to fulls 2/13. Adv to regular 2/14 as copious gas/stool in ostomy.  3 - Acute Blood Loss Anemia - approx 1000 EBL surgery 2/8. Hgb initially stable at 9.3 and started SQ heparin proph. Hgb drop to 7.3 noted 2/10 and with tachycardia. Transfused 2uPRBC 01/29/16 with appropriate response back up to Hb of 9.3 and has remained stable.  4 - Left Back / Hip Pain / Lumbago - new sharp shooting discomfort with bending / twisting after becoming more ambulatory 2/15. No relation to eating. No fevers. Starting PO ketorolac 2/16 and resolved.   5 - Disposition - pt independent in all ADL's at baseline. PT working with patient, rec rolling walker, hospital bed with rails, and OT consult. Rec 3x/week evaluation.   In summary she had relatively uneventul recovery following major surgery for bladder cancer.   Consults: PT, OT, Case Management, General  Surgery  Significant Diagnostic Studies: labs: as per above  Treatments: surgery:  open radical cystectomy + TAH/BSO + total uretrhectomy + pelvic node dissection + colon conduit and end colostomy 01/27/16  Discharge Exam: Blood pressure 161/66, pulse 86, temperature 98.2 F (36.8 C), temperature source Oral, resp. rate 18, height '5\' 5"'$  (1.651 m), weight 71.94 kg (158 lb 9.6 oz), SpO2 99 %. General appearance: alert, cooperative and appears stated age Head: Normocephalic, without obvious abnormality, atraumatic Eyes: negative Nose: Nares normal. Septum midline. Mucosa normal. No drainage or sinus tenderness. Throat: lips, mucosa, and tongue normal; teeth and gums normal Neck: supple, symmetrical, trachea midline Back: symmetric, no curvature. ROM normal. No CVA tenderness. Resp: non-labored on room air Cardio: Nl rate GI: soft, non-tender; bowel sounds normal; no masses,  no organomegaly Pelvic: external genitalia normal and scant vaginal spotting Extremities: extremities normal, atraumatic, no cyanosis or edema Lymph nodes: Cervical, supraclavicular, and axillary nodes normal. Neurologic: Grossly normal Incision/Wound: midline incision c/d/i with staples. RLQ Urostomy pink / patetn with copious yellow urien and bander stents in situ. LLQ colosotmy patetn or partially formed stool and gas.  Disposition: Final discharge disposition not confirmed     Medication List    TAKE these medications        isoniazid 300 MG tablet  Commonly known as:  NYDRAZID  Take 300 mg by mouth daily.     ketorolac 10 MG tablet  Commonly known as:  TORADOL  Take 1 tablet (10 mg total) by mouth every 8 (eight) hours as needed for moderate pain. Post-operatively     oxyCODONE-acetaminophen 5-325 MG tablet  Commonly known as:  PERCOCET/ROXICET  Take 1-2  tablets by mouth every 6 (six) hours as needed for severe pain. Post-operatively     predniSONE 5 MG tablet  Commonly known as:  DELTASONE  Take  2.5-20 mg by mouth See admin instructions. Take 4 tablets daily for 2 days, then 2 tablets daily for 2 weeks, then 1 tablet daily for 2 weeks, then 0.5 tablet daily for 2 weeks           Follow-up Information    Follow up with Alexis Frock, MD On 02/15/2016.   Specialty:  Urology   Why:  at 10:30   Contact information:   [address] [place] [address] [phone]       Signed: Alexis Frock 02/05/2016, 7:28 AM

## 2016-02-05 NOTE — Consult Note (Signed)
WOC ostomy follow up Stoma type/location: RUQ Colon conduit with two stents intact Stomal assessment/size: 1 and 5/8 inches red, moist budded, visualized through pouch Peristomal assessment: Not seen today Treatment options for stomal/peristomal skin:none Output clear urine in tubing Ostomy pouching: 1pc.convex urinary pouch.  Education provided: Patient is confident and independent in emptying urostomy and in attaching and disconnecting from bedside urinary drainage bag. Discharging home to family with support from [facility].  WIll follow up with MD. Enrolled patient in [place] Start Discharge program: Yes   WOC ostomy follow up Stoma type/location: LUQ colostomy Stomal assessment/size: 1 and 3/4 inches oval, dark mucosa sloughing-visualized through pouch. Peristomal assessment: not seen today. Treatment options for stomal/peristomal skin: skin barrier ring Output: brown stool, pouch has just been emptied. Ostomy pouching: 2pc. With skin barrier ring Education provided: Patient is competent in emptying colostomy pouching system.  Has supplies and is being discharged to the care of a [facility] [place] to her family with follow up by the MDs. Enrolled patient in [place] Start Discharge program: Yes  [place] nursing team will follow, and will remain available to this patient, the nursing and medical teams.   Thanks, Maudie Flakes, MSN, RN, [place], Arther Abbott  Pager# [phone]

## 2016-02-10 ENCOUNTER — Emergency Department (HOSPITAL_COMMUNITY): Payer: PPO

## 2016-02-10 ENCOUNTER — Inpatient Hospital Stay (HOSPITAL_COMMUNITY)
Admission: EM | Admit: 2016-02-10 | Discharge: 2016-02-22 | DRG: 698 | Disposition: A | Discharge status: Home Health | Payer: PPO | Attending: Urology | Admitting: Urology

## 2016-02-10 ENCOUNTER — Encounter (HOSPITAL_COMMUNITY): Payer: Self-pay | Admitting: Emergency Medicine

## 2016-02-10 DIAGNOSIS — Z1623 Resistance to quinolones and fluoroquinolones: Secondary | ICD-10-CM | POA: Diagnosis present

## 2016-02-10 DIAGNOSIS — Z85038 Personal history of other malignant neoplasm of large intestine: Secondary | ICD-10-CM | POA: Diagnosis not present

## 2016-02-10 DIAGNOSIS — R188 Other ascites: Secondary | ICD-10-CM | POA: Diagnosis present

## 2016-02-10 DIAGNOSIS — K9409 Other complications of colostomy: Secondary | ICD-10-CM | POA: Diagnosis present

## 2016-02-10 DIAGNOSIS — Z9049 Acquired absence of other specified parts of digestive tract: Secondary | ICD-10-CM | POA: Diagnosis not present

## 2016-02-10 DIAGNOSIS — R109 Unspecified abdominal pain: Secondary | ICD-10-CM | POA: Diagnosis present

## 2016-02-10 DIAGNOSIS — C679 Malignant neoplasm of bladder, unspecified: Secondary | ICD-10-CM | POA: Diagnosis present

## 2016-02-10 DIAGNOSIS — Z87891 Personal history of nicotine dependence: Secondary | ICD-10-CM

## 2016-02-10 DIAGNOSIS — N2889 Other specified disorders of kidney and ureter: Secondary | ICD-10-CM

## 2016-02-10 DIAGNOSIS — Z90710 Acquired absence of both cervix and uterus: Secondary | ICD-10-CM

## 2016-02-10 DIAGNOSIS — K6811 Postprocedural retroperitoneal abscess: Secondary | ICD-10-CM | POA: Diagnosis present

## 2016-02-10 DIAGNOSIS — R8271 Bacteriuria: Secondary | ICD-10-CM | POA: Diagnosis present

## 2016-02-10 DIAGNOSIS — K9400 Colostomy complication, unspecified: Secondary | ICD-10-CM

## 2016-02-10 DIAGNOSIS — Z6824 Body mass index (BMI) 24.0-24.9, adult: Secondary | ICD-10-CM | POA: Diagnosis not present

## 2016-02-10 DIAGNOSIS — R509 Fever, unspecified: Secondary | ICD-10-CM | POA: Diagnosis present

## 2016-02-10 DIAGNOSIS — E43 Unspecified severe protein-calorie malnutrition: Secondary | ICD-10-CM | POA: Diagnosis present

## 2016-02-10 DIAGNOSIS — Z906 Acquired absence of other parts of urinary tract: Secondary | ICD-10-CM

## 2016-02-10 DIAGNOSIS — B9620 Unspecified Escherichia coli [E. coli] as the cause of diseases classified elsewhere: Secondary | ICD-10-CM | POA: Diagnosis present

## 2016-02-10 DIAGNOSIS — K5660 Unspecified intestinal obstruction: Secondary | ICD-10-CM | POA: Diagnosis present

## 2016-02-10 DIAGNOSIS — N9989 Other postprocedural complications and disorders of genitourinary system: Secondary | ICD-10-CM | POA: Diagnosis present

## 2016-02-10 DIAGNOSIS — Z880 Allergy status to penicillin: Secondary | ICD-10-CM

## 2016-02-10 DIAGNOSIS — Z881 Allergy status to other antibiotic agents status: Secondary | ICD-10-CM

## 2016-02-10 DIAGNOSIS — Z79899 Other long term (current) drug therapy: Secondary | ICD-10-CM | POA: Diagnosis not present

## 2016-02-10 DIAGNOSIS — N368 Other specified disorders of urethra: Secondary | ICD-10-CM | POA: Diagnosis present

## 2016-02-10 DIAGNOSIS — IMO0002 Reserved for concepts with insufficient information to code with codable children: Secondary | ICD-10-CM | POA: Diagnosis present

## 2016-02-10 LAB — CBC WITH DIFFERENTIAL/PLATELET
BASOS ABS: 0 10*3/uL (ref 0.0–0.1)
Basophils Relative: 0 %
Eosinophils Absolute: 0 10*3/uL (ref 0.0–0.7)
Eosinophils Relative: 0 %
HCT: 29 % — ABNORMAL LOW (ref 36.0–46.0)
Hemoglobin: 9.7 g/dL — ABNORMAL LOW (ref 12.0–15.0)
LYMPHS ABS: 1.2 10*3/uL (ref 0.7–4.0)
Lymphocytes Relative: 10 %
MCH: 29.6 pg (ref 26.0–34.0)
MCHC: 33.4 g/dL (ref 30.0–36.0)
MCV: 88.4 fL (ref 78.0–100.0)
MONO ABS: 1.4 10*3/uL — AB (ref 0.1–1.0)
Monocytes Relative: 12 %
Neutro Abs: 9.1 10*3/uL — ABNORMAL HIGH (ref 1.7–7.7)
Neutrophils Relative %: 78 %
PLATELETS: 745 10*3/uL — AB (ref 150–400)
RBC: 3.28 MIL/uL — AB (ref 3.87–5.11)
RDW: 13.9 % (ref 11.5–15.5)
WBC: 11.7 10*3/uL — AB (ref 4.0–10.5)

## 2016-02-10 LAB — I-STAT CHEM 8, ED
BUN: 18 mg/dL (ref 6–20)
CALCIUM ION: 1.05 mmol/L — AB (ref 1.13–1.30)
CREATININE: 0.6 mg/dL (ref 0.44–1.00)
Chloride: 96 mmol/L — ABNORMAL LOW (ref 101–111)
Glucose, Bld: 112 mg/dL — ABNORMAL HIGH (ref 65–99)
HEMATOCRIT: 29 % — AB (ref 36.0–46.0)
HEMOGLOBIN: 9.9 g/dL — AB (ref 12.0–15.0)
Potassium: 4 mmol/L (ref 3.5–5.1)
SODIUM: 134 mmol/L — AB (ref 135–145)
TCO2: 25 mmol/L (ref 0–100)

## 2016-02-10 LAB — COMPREHENSIVE METABOLIC PANEL
ALT: 154 U/L — ABNORMAL HIGH (ref 14–54)
AST: 106 U/L — ABNORMAL HIGH (ref 15–41)
Albumin: 2.4 g/dL — ABNORMAL LOW (ref 3.5–5.0)
Alkaline Phosphatase: 104 U/L (ref 38–126)
Anion gap: 11 (ref 5–15)
BUN: 19 mg/dL (ref 6–20)
CO2: 25 mmol/L (ref 22–32)
Calcium: 8.4 mg/dL — ABNORMAL LOW (ref 8.9–10.3)
Chloride: 99 mmol/L — ABNORMAL LOW (ref 101–111)
Creatinine, Ser: 0.65 mg/dL (ref 0.44–1.00)
GFR calc Af Amer: >60 mL/min (ref >60)
GFR calc non Af Amer: >60 mL/min (ref >60)
Glucose, Bld: 107 mg/dL — ABNORMAL HIGH (ref 65–99)
Potassium: 4.1 mmol/L (ref 3.5–5.1)
Sodium: 135 mmol/L (ref 135–145)
Total Bilirubin: 0.9 mg/dL (ref 0.3–1.2)
Total Protein: 6.5 g/dL (ref 6.5–8.1)

## 2016-02-10 LAB — I-STAT CG4 LACTIC ACID, ED: Lactic Acid, Venous: 1.44 mmol/L (ref 0.5–2.0)

## 2016-02-10 LAB — MRSA PCR SCREENING: MRSA by PCR: POSITIVE — AB

## 2016-02-10 MED ORDER — ACETAMINOPHEN 650 MG RE SUPP: 650.0000 mg | Freq: Four times a day (QID) | RECTAL | Status: DC | PRN

## 2016-02-10 MED ORDER — ONDANSETRON HCL 4 MG/2ML IJ SOLN
4.0000 mg | Freq: Four times a day (QID) | INTRAMUSCULAR | Status: DC | PRN
  Administered 2016-02-19 – 2016-02-20 (×2): 4 mg via INTRAVENOUS
  Filled 2016-02-10 (×2): qty 2

## 2016-02-10 MED ORDER — ENOXAPARIN SODIUM 40 MG/0.4ML ~~LOC~~ SOLN
40.0000 mg | SUBCUTANEOUS | Status: DC
  Administered 2016-02-10: 40 mg via SUBCUTANEOUS
  Filled 2016-02-10 (×2): qty 0.4

## 2016-02-10 MED ORDER — IOHEXOL 300 MG/ML  SOLN
100.0000 mL | Freq: Once | INTRAMUSCULAR | Status: AC | PRN
  Administered 2016-02-10: 100 mL via INTRAVENOUS

## 2016-02-10 MED ORDER — ACETAMINOPHEN 325 MG PO TABS: 650.0000 mg | ORAL_TABLET | Freq: Four times a day (QID) | ORAL | Status: DC | PRN

## 2016-02-10 MED ORDER — HYDROMORPHONE HCL 1 MG/ML IJ SOLN
0.5000 mg | Freq: Once | INTRAMUSCULAR | Status: AC
  Administered 2016-02-10: 0.5 mg via INTRAVENOUS
  Filled 2016-02-10: qty 1

## 2016-02-10 MED ORDER — IOHEXOL 300 MG/ML  SOLN: 50.0000 mL | Freq: Once | INTRAMUSCULAR | Status: DC | PRN

## 2016-02-10 MED ORDER — HYDROMORPHONE HCL 1 MG/ML IJ SOLN
1.0000 mg | INTRAMUSCULAR | Status: DC | PRN
  Administered 2016-02-10 – 2016-02-16 (×20): 1 mg via INTRAVENOUS
  Filled 2016-02-10 (×20): qty 1

## 2016-02-10 MED ORDER — ONDANSETRON 4 MG PO TBDP: 4.0000 mg | ORAL_TABLET | Freq: Four times a day (QID) | ORAL | Status: DC | PRN

## 2016-02-10 MED ORDER — IOHEXOL 300 MG/ML  SOLN
50.0000 mL | Freq: Once | INTRAMUSCULAR | Status: AC | PRN
  Administered 2016-02-10: 50 mL via ORAL

## 2016-02-10 MED ORDER — SODIUM CHLORIDE 0.9 % IV BOLUS (SEPSIS)
1000.0000 mL | Freq: Once | INTRAVENOUS | Status: AC
  Administered 2016-02-10: 1000 mL via INTRAVENOUS

## 2016-02-10 MED ORDER — DEXTROSE-NACL 5-0.9 % IV SOLN
INTRAVENOUS | Status: DC
  Administered 2016-02-10: 1000 mL via INTRAVENOUS
  Administered 2016-02-11 – 2016-02-12 (×3): via INTRAVENOUS

## 2016-02-10 NOTE — ED Notes (Addendum)
Patient discharged from here last Friday with colostomy d/t bladder cancer.  Patient reports pain to stoma and shooting to the back.  Per home health nurse there is necrosis to the stoma.  No fevers. Dr. Tammi Klippel is MD.

## 2016-02-10 NOTE — ED Provider Notes (Signed)
CSN: [id number]     Arrival date & time 02/10/16  1214 History   First MD Initiated Contact with Patient 02/10/16 1257     Chief Complaint  Patient presents with  . Abdominal Pain     (Consider location/radiation/quality/duration/timing/severity/associated sxs/prior Treatment) HPI   Blood pressure 131/63, pulse 79, temperature 98.6 F (37 C), temperature source Oral, resp. rate 18, height '5\' 5"'$  (1.651 m), weight 65.772 kg, SpO2 100 %.  [name] is a 70 y.o. female complaining of with a history of bladder cancer and is s/p ileal conduit/urostomy 01/27/2016 by Dr. Tresa Moore patient has history of colon cancer and she had significant adhesions, Dr. Johney Maine performed partial colectomy with colostomy during the same OR visit. She presents today with aching abdominal pain radiating around to her back that she rates 8/10. She had a colostomy bag and urostomy bag placed February 8th and was discharged from the hospital last Friday. Pt has had home health since discharge. States today a new nurse told her the stoma was not healing and said there was dark tissue. She states she has had pain for a long time that has become worse over the past few days. Pt has not had output in her colostomy bag for the past 2 days. Her urine output has been normal. Denies fevers, vomiting, CP or SOB.   Past Medical History  Diagnosis Date  . Bladder cancer ([place])     Bladder and Colon 2014  . Colon cancer [facility])    Past Surgical History  Procedure Laterality Date  . Colon surgery  November 2014  . Robot assisted laparoscopic complete cystect ileal conduit N/A 01/27/2016    Procedure: XI ROBOTIC ASSISTED LAPAROSCOPIC COMPLETE CYSTECT ILEAL CONDUIT, HYSTERECTOMY, BILATERAL OOPHORECTOMY,  CYSTOSCOPY WITH INDOCYANINE GREEN DYE CONVERTED TO OPEN;  Surgeon: Alexis Frock, MD;  Location: [facility];  Service: Urology;  Laterality: N/A;  . Lymphadenectomy N/A 01/27/2016    Procedure: LYMPHADENECTOMY;  Surgeon: Alexis Frock, MD;   Location: [facility];  Service: Urology;  Laterality: N/A;  . Lymphadenectomy  01/27/2016    Procedure: LYMPHADENECTOMY;  Surgeon: Fanny Skates, MD;  Location: [facility];  Service: General;;  . Laparoscopic lysis of adhesions  01/27/2016    Procedure: EXTENSIVE  LYSIS OF ADHESIONS, REPAIR OF ADHESIONS;  Surgeon: Fanny Skates, MD;  Location: [facility];  Service: General;;  SPLENIC FLEXURE MOBILIZATION  . Colostomy closure  01/27/2016    Procedure: END COLOSTOMY;  Surgeon: Fanny Skates, MD;  Location: [facility];  Service: General;;  . Partial colectomy N/A 01/27/2016    Procedure: SIGMOID  COLECTOMY;  Surgeon: Fanny Skates, MD;  Location: [facility];  Service: General;  Laterality: N/A;   History reviewed. No pertinent family history. Social History  Substance Use Topics  . Smoking status: Former Smoker    Quit date: 12/19/2012  . Smokeless tobacco: Never Used  . Alcohol Use: No   OB History    No data available     Review of Systems  10 systems reviewed and found to be negative, except as noted in the HPI.   Allergies  Ciprofloxacin and Penicillins  Home Medications   Prior to Admission medications   Medication Sig Start Date End Date Taking? Authorizing Provider  Docusate Calcium (STOOL SOFTENER PO) Take 1 capsule by mouth daily as needed (constipation.).   Yes Historical Provider, MD  isoniazid (NYDRAZID) 300 MG tablet Take 300 mg by mouth daily.   Yes Historical Provider, MD  ketorolac (TORADOL) 10 MG tablet  Take 1 tablet (10 mg total) by mouth every 8 (eight) hours as needed for moderate pain. Post-operatively 02/05/16  Yes Alexis Frock, MD  oxyCODONE-acetaminophen (PERCOCET/ROXICET) 5-325 MG tablet Take 1-2 tablets by mouth every 6 (six) hours as needed for severe pain. Post-operatively 02/05/16  Yes Alexis Frock, MD   BP 131/63 mmHg  Pulse 79  Temp(Src) 98.6 F (37 C) (Oral)  Resp 18  Ht '5\' 5"'$  (1.651 m)  Wt 65.772 kg  BMI 24.13 kg/m2  SpO2 100% Physical Exam  Constitutional:  She is oriented to person, place, and time. She appears well-developed and well-nourished. No distress.  HENT:  Head: Normocephalic.  Mouth/Throat: Oropharynx is clear and moist.  Eyes: Conjunctivae and EOM are normal.  Cardiovascular: Normal rate, regular rhythm and intact distal pulses.   Pulmonary/Chest: Effort normal and breath sounds normal. No stridor.  Abdominal: Soft. There is tenderness.  Normoactive bowel sounds, well-healing midline surgical scar with staples in place. No surrounding cellulitis. No discharge or warmth. Ostomy on the right side of the abdomen pink with normal urine output. Ostomy on the left side of the abdomen foul-smelling with dusky tissue, no output intact.  Musculoskeletal: Normal range of motion.  Neurological: She is alert and oriented to person, place, and time.  Psychiatric: She has a normal mood and affect.  Nursing note and vitals reviewed.         ED Course  Procedures (including critical care time) Labs Review Labs Reviewed  CBC WITH DIFFERENTIAL/PLATELET - Abnormal; Notable for the following:    WBC 11.7 (*)    RBC 3.28 (*)    Hemoglobin 9.7 (*)    HCT 29.0 (*)    Platelets 745 (*)    Neutro Abs 9.1 (*)    Monocytes Absolute 1.4 (*)    All other components within normal limits  COMPREHENSIVE METABOLIC PANEL - Abnormal; Notable for the following:    Chloride 99 (*)    Glucose, Bld 107 (*)    Calcium 8.4 (*)    Albumin 2.4 (*)    AST 106 (*)    ALT 154 (*)    All other components within normal limits  I-STAT CHEM 8, ED - Abnormal; Notable for the following:    Sodium 134 (*)    Chloride 96 (*)    Glucose, Bld 112 (*)    Calcium, Ion 1.05 (*)    Hemoglobin 9.9 (*)    HCT 29.0 (*)    All other components within normal limits  CULTURE, BLOOD (ROUTINE X 2)  CULTURE, BLOOD (ROUTINE X 2)  I-STAT CG4 LACTIC ACID, ED    Imaging Review No results found. I have personally reviewed and evaluated these images and lab results as part  of my medical decision-making.   EKG Interpretation None      MDM   Final diagnoses:  Colostomy complication ([place])    Filed Vitals:   02/10/16 1225 02/10/16 1251  BP:  131/63  Pulse:  79  Temp:  98.6 F (37 C)  TempSrc:  Oral  Resp:  18  Height:  '5\' 5"'$  (1.651 m)  Weight:  65.772 kg  SpO2: 94% 100%    Medications  enoxaparin (LOVENOX) injection 40 mg (not administered)  dextrose 5 %-0.9 % sodium chloride infusion (not administered)  acetaminophen (TYLENOL) tablet 650 mg (not administered)    Or  acetaminophen (TYLENOL) suppository 650 mg (not administered)  HYDROmorphone (DILAUDID) injection 1 mg (not administered)  ondansetron (ZOFRAN-ODT) disintegrating tablet 4 mg (not  administered)    Or  ondansetron (ZOFRAN) injection 4 mg (not administered)  sodium chloride 0.9 % bolus 1,000 mL (1,000 mLs Intravenous New Bag/Given 02/10/16 1341)  HYDROmorphone (DILAUDID) injection 0.5 mg (0.5 mg Intravenous Given 02/10/16 1359)  iohexol (OMNIPAQUE) 300 MG/ML solution 50 mL (50 mLs Oral Contrast Given 02/10/16 1445)    [name] is 70 y.o. female presenting with diffuse abdominal pain, no output from newly formed colostomy, colostomy does appear necrotic as well. Discussed with attending physician who recommends basic blood work and stat  consultation with surgeons to formulate plan. Discussed with urology Dr. Dorna Bloom will be available if general surgery consultation.  2:20 PM Discussed case with PA Reibock and Dr. Excell Seltzer they will evaluate the patient, recommend getting CT abdomen pelvis with by mouth and IV contrast, state they will manage the antibiotics.  3:16 PM: General surgery has placed admission orders.     Monico Blitz, PA-C 02/10/16 1516  Leo Grosser, MD 02/10/16 2245

## 2016-02-10 NOTE — ED Notes (Signed)
Pt can go to floor at 15;35

## 2016-02-10 NOTE — ED Notes (Signed)
Surgeon at the bedside.

## 2016-02-10 NOTE — ED Notes (Addendum)
Patient reports she had colostomy and urostomy placed on 01/27/16.  Reports that she has had home health since being discharged from hospital last Friday.  States that today new nurse came out and told her that stoma was not healing properly where colostomy is.  Tissue noted to be dark to this area.  Urostomy stoma noted to be red.  Patient denies pain in this area.

## 2016-02-10 NOTE — ED Notes (Signed)
PA at the bedside.

## 2016-02-10 NOTE — H&P (Signed)
Chief Complaint: abdominal pain, necrotic ostomy HPI: [name] is a 70 year old s/p robotic converted to open radical cystectomy with pelvic exenteration, hysterectomy, bil salingo-oophorectomy, total urethrectomy, pelvic lymphadenectomy, colon conduit urinary diversion by Dr. Tresa Moore and a lysis of adhesions, sigmoid colectomy with resection of prior anastomosis, end colostomy--Dr. Dalbert Batman and Gross 01/27/16 for bladder cancer and a history of colon cancer and bowel resection.  She was discharged last Friday(POD#8).  The patient reports no output from colostomy for the past 2 days and worsening abdominal pain.  Denies fever, chills or sweats.  Appetite has been fair.  Pain is mostly around the ostomy and the left lower quadrant.  Has been taking stool softeners and minimal narcotics.  Home health nursing arrived to the house today and recommend ED referral due to discolored colostomy.    ED work up reveals, normal renal function.  WBC of 11.7.      Past Medical History  Diagnosis Date  . Bladder cancer ([place])     Bladder and Colon 2014  . Colon cancer [facility])     Past Surgical History  Procedure Laterality Date  . Colon surgery  November 2014  . Robot assisted laparoscopic complete cystect ileal conduit N/A 01/27/2016    Procedure: XI ROBOTIC ASSISTED LAPAROSCOPIC COMPLETE CYSTECT ILEAL CONDUIT, HYSTERECTOMY, BILATERAL OOPHORECTOMY,  CYSTOSCOPY WITH INDOCYANINE GREEN DYE CONVERTED TO OPEN;  Surgeon: Alexis Frock, MD;  [facility];  Service: Urology;  Laterality: N/A;  . Lymphadenectomy N/A 01/27/2016    Procedure: LYMPHADENECTOMY;  Surgeon: Alexis Frock, MD;  [facility];  Service: Urology;  Laterality: N/A;  . Lymphadenectomy  01/27/2016    Procedure: LYMPHADENECTOMY;  Surgeon: Fanny Skates, MD;  Location[facility];  Service: General;;  . Laparoscopic lysis of adhesions  01/27/2016    Procedure: EXTENSIVE  LYSIS OF ADHESIONS, REPAIR OF ADHESIONS;  Surgeon: Fanny Skates, MD;   [facility];  Service: General;;  SPLENIC FLEXURE MOBILIZATION  . Colostomy closure  01/27/2016    Procedure: END COLOSTOMY;  Surgeon: Fanny Skates, MD;  [facility];  Service: General;;  . Partial colectomy N/A 01/27/2016    Procedure: SIGMOID  COLECTOMY;  Surgeon: Fanny Skates, MD;  [facility];  Service: General;  Laterality: N/A;    History reviewed. No pertinent family history. Social History:  reports that she quit smoking about 3 years ago. She has never used smokeless tobacco. She reports that she does not drink alcohol or use illicit drugs.  Allergies:  Allergies  Allergen Reactions  . Ciprofloxacin Shortness Of Breath and Swelling  . Penicillins Hives    Has patient had a PCN reaction causing immediate rash, facial/tongue/throat swelling, SOB or lightheadedness with hypotension: No Has patient had a PCN reaction causing severe rash involving mucus membranes or skin necrosis: No Has patient had a PCN reaction that required hospitalization Yes Has patient had a PCN reaction occurring within the last 10 years: No If all of the above answers are "NO", then may proceed with Cephalosporin use.     (Not in a hospital admission)  Results for orders placed or performed during the hospital encounter of 02/10/16 (from the past 48 hour(s))  CBC with Differential     Status: Abnormal   Collection Time: 02/10/16  1:42 PM  Result Value Ref Range   WBC 11.7 (H) 4.0 - 10.5 K/uL   RBC 3.28 (L) 3.87 - 5.11 MIL/uL   Hemoglobin 9.7 (L) 12.0 - 15.0 g/dL   HCT 29.0 (L)  36.0 - 46.0 %   MCV 88.4 78.0 - 100.0 fL   MCH 29.6 26.0 - 34.0 pg   MCHC 33.4 30.0 - 36.0 g/dL   RDW 13.9 11.5 - 15.5 %   Platelets 745 (H) 150 - 400 K/uL   Neutrophils Relative % 78 %   Lymphocytes Relative 10 %   Monocytes Relative 12 %   Eosinophils Relative 0 %   Basophils Relative 0 %   Neutro Abs 9.1 (H) 1.7 - 7.7 K/uL   Lymphs Abs 1.2 0.7 - 4.0 K/uL   Monocytes Absolute 1.4 (H) 0.1 - 1.0 K/uL    Eosinophils Absolute 0.0 0.0 - 0.7 K/uL   Basophils Absolute 0.0 0.0 - 0.1 K/uL   Smear Review MORPHOLOGY UNREMARKABLE   Comprehensive metabolic panel     Status: Abnormal   Collection Time: 02/10/16  1:42 PM  Result Value Ref Range   Sodium 135 135 - 145 mmol/L   Potassium 4.1 3.5 - 5.1 mmol/L   Chloride 99 (L) 101 - 111 mmol/L   CO2 25 22 - 32 mmol/L   Glucose, Bld 107 (H) 65 - 99 mg/dL   BUN 19 6 - 20 mg/dL   Creatinine, Ser 0.65 0.44 - 1.00 mg/dL   Calcium 8.4 (L) 8.9 - 10.3 mg/dL   Total Protein 6.5 6.5 - 8.1 g/dL   Albumin 2.4 (L) 3.5 - 5.0 g/dL   AST 106 (H) 15 - 41 U/L   ALT 154 (H) 14 - 54 U/L   Alkaline Phosphatase 104 38 - 126 U/L   Total Bilirubin 0.9 0.3 - 1.2 mg/dL   GFR calc non Af Amer >60 >60 mL/min   GFR calc Af Amer >60 >60 mL/min    Comment: (NOTE) The eGFR has been calculated using the CKD EPI equation. This calculation has not been validated in all clinical situations. eGFR's persistently <60 mL/min signify possible Chronic Kidney Disease.    Anion gap 11 5 - 15  I-Stat Chem 8, ED     Status: Abnormal   Collection Time: 02/10/16  1:52 PM  Result Value Ref Range   Sodium 134 (L) 135 - 145 mmol/L   Potassium 4.0 3.5 - 5.1 mmol/L   Chloride 96 (L) 101 - 111 mmol/L   BUN 18 6 - 20 mg/dL   Creatinine, Ser 0.60 0.44 - 1.00 mg/dL   Glucose, Bld 112 (H) 65 - 99 mg/dL   Calcium, Ion 1.05 (L) 1.13 - 1.30 mmol/L   TCO2 25 0 - 100 mmol/L   Hemoglobin 9.9 (L) 12.0 - 15.0 g/dL   HCT 29.0 (L) 36.0 - 46.0 %  I-Stat CG4 Lactic Acid, ED     Status: None   Collection Time: 02/10/16  1:53 PM  Result Value Ref Range   Lactic Acid, Venous 1.44 0.5 - 2.0 mmol/L   No results found.  Review of Systems  Constitutional: Positive for malaise/fatigue. Negative for fever, chills, weight loss and diaphoresis.  Eyes: Negative for blurred vision, double vision, photophobia, pain, discharge and redness.  Respiratory: Negative for cough, hemoptysis, sputum production, shortness  of breath and wheezing.   Cardiovascular: Negative for chest pain, palpitations, orthopnea, claudication, leg swelling and PND.  Gastrointestinal: Positive for abdominal pain. Negative for heartburn, nausea, vomiting, diarrhea, blood in stool and melena.  Genitourinary:       RUQ urostomy, no hematuria   Neurological: Negative for dizziness, tingling, tremors, sensory change, speech change, focal weakness, seizures, loss of consciousness and weakness.  Blood pressure 131/63, pulse 79, temperature 98.6 F (37 C), temperature source Oral, resp. rate 18, height 5' 5"  (1.651 m), weight 65.772 kg (145 lb), SpO2 100 %. Physical Exam  Constitutional: She is oriented to person, place, and time. She appears well-developed and well-nourished. No distress.  Cardiovascular: Normal rate, regular rhythm, normal heart sounds and intact distal pulses.  Exam reveals no gallop and no friction rub.   No murmur heard. Respiratory: Effort normal and breath sounds normal.  GI: Bowel sounds are normal.  Moderately tender at ostomy and llq.  Ostomy is necrotic, but appears superficial, viable beneath.  No ostomy output. RUQ urostomy   Neurological: She is alert and oriented to person, place, and time.  Skin: Skin is warm and dry. No rash noted. She is not diaphoretic. No erythema. No pallor.  Psychiatric: She has a normal mood and affect. Her behavior is normal. Judgment and thought content normal.     Assessment/Plan  s/p robotic converted to open radical cystectomy with pelvic exenteration, hysterectomy, bil salingo-oophorectomy, total urethrectomy, pelvic lymphadenectomy, colon conduit urinary diversion by Dr. Tresa Moore and a lysis of adhesions, sigmoid colectomy with resection of prior anastomosis, end colostomy--Dr. Dalbert Batman and Gross 01/27/16   Abdominal pain-proceed with CT of A/P for further evaluation such as an obstruction or intra-abdominal abscess.  Admit for pain control, IV hydration.  The ostomy is  necrotic, but viable upon further exam. Monitor for now.    FEN-NPO for now, IVF, pain control VTE prophylaxis-SCD/lovenox Dispo-admit to floor     Amory Simonetti, NP  02/10/2016, 3:01 PM

## 2016-02-11 ENCOUNTER — Inpatient Hospital Stay (HOSPITAL_COMMUNITY): Payer: PPO

## 2016-02-11 ENCOUNTER — Encounter (HOSPITAL_COMMUNITY): Payer: Self-pay | Admitting: General Surgery

## 2016-02-11 LAB — PROTIME-INR
INR: 1.25 (ref 0.00–1.49)
Prothrombin Time: 15.8 seconds — ABNORMAL HIGH (ref 11.6–15.2)

## 2016-02-11 LAB — BASIC METABOLIC PANEL
Anion gap: 10 (ref 5–15)
BUN: 14 mg/dL (ref 6–20)
CHLORIDE: 105 mmol/L (ref 101–111)
CO2: 22 mmol/L (ref 22–32)
CREATININE: 0.51 mg/dL (ref 0.44–1.00)
Calcium: 8 mg/dL — ABNORMAL LOW (ref 8.9–10.3)
GFR calc Af Amer: >60 mL/min (ref >60)
GLUCOSE: 148 mg/dL — AB (ref 65–99)
POTASSIUM: 3.8 mmol/L (ref 3.5–5.1)
SODIUM: 137 mmol/L (ref 135–145)

## 2016-02-11 LAB — CBC
HEMATOCRIT: 25.8 % — AB (ref 36.0–46.0)
Hemoglobin: 8.4 g/dL — ABNORMAL LOW (ref 12.0–15.0)
MCH: 29.2 pg (ref 26.0–34.0)
MCHC: 32.6 g/dL (ref 30.0–36.0)
MCV: 89.6 fL (ref 78.0–100.0)
Platelets: 709 10*3/uL — ABNORMAL HIGH (ref 150–400)
RBC: 2.88 MIL/uL — ABNORMAL LOW (ref 3.87–5.11)
RDW: 14.2 % (ref 11.5–15.5)
WBC: 12 10*3/uL — AB (ref 4.0–10.5)

## 2016-02-11 MED ORDER — ENOXAPARIN SODIUM 40 MG/0.4ML ~~LOC~~ SOLN
40.0000 mg | SUBCUTANEOUS | Status: DC
Start: 2016-02-12 — End: 2016-02-22
  Administered 2016-02-12 – 2016-02-22 (×10): 40 mg via SUBCUTANEOUS
  Filled 2016-02-11 (×11): qty 0.4

## 2016-02-11 MED ORDER — MIDAZOLAM HCL 2 MG/2ML IJ SOLN
INTRAMUSCULAR | Status: AC
  Filled 2016-02-11: qty 6

## 2016-02-11 MED ORDER — CHLORHEXIDINE GLUCONATE CLOTH 2 % EX PADS
6.0000 | MEDICATED_PAD | Freq: Every day | CUTANEOUS | Status: DC
  Administered 2016-02-11 – 2016-02-12 (×2): 6 via TOPICAL

## 2016-02-11 MED ORDER — MUPIROCIN 2 % EX OINT
1.0000 "application " | TOPICAL_OINTMENT | Freq: Two times a day (BID) | CUTANEOUS | Status: AC
  Administered 2016-02-11 – 2016-02-15 (×10): 1 via NASAL
  Filled 2016-02-11 (×2): qty 22

## 2016-02-11 MED ORDER — ENOXAPARIN SODIUM 40 MG/0.4ML ~~LOC~~ SOLN
40.0000 mg | SUBCUTANEOUS | Status: DC
  Filled 2016-02-11: qty 0.4

## 2016-02-11 MED ORDER — FENTANYL CITRATE (PF) 100 MCG/2ML IJ SOLN
INTRAMUSCULAR | Status: AC
  Filled 2016-02-11: qty 4

## 2016-02-11 MED ORDER — FENTANYL CITRATE (PF) 100 MCG/2ML IJ SOLN
INTRAMUSCULAR | Status: AC | PRN
  Administered 2016-02-11 (×2): 25 ug via INTRAVENOUS

## 2016-02-11 MED ORDER — SODIUM CHLORIDE 0.9 % IV SOLN
500.0000 mg | Freq: Three times a day (TID) | INTRAVENOUS | Status: DC
  Administered 2016-02-11 – 2016-02-18 (×22): 500 mg via INTRAVENOUS
  Filled 2016-02-11 (×22): qty 500

## 2016-02-11 MED ORDER — MIDAZOLAM HCL 2 MG/2ML IJ SOLN
INTRAMUSCULAR | Status: AC | PRN
  Administered 2016-02-11 (×3): 0.5 mg via INTRAVENOUS

## 2016-02-11 NOTE — Sedation Documentation (Signed)
Patient denies pain and is resting comfortably.  

## 2016-02-11 NOTE — Plan of Care (Signed)
Problem: Activity: Goal: Risk for activity intolerance will decrease Outcome: Progressing Patient was out of bed in the recliner this am.

## 2016-02-11 NOTE — Progress Notes (Signed)
S/P placement of 14 F biliary drain per report by Selena Batten RN.  Dsg clean,dry and intact. Drainage bag emptied, noted 250cc of purulent fluids. Vital signs checked,placed her comfortably in bed. Patient c/ of pain 7/10, PRN Dialudis administered per order. Will continue to monitor.

## 2016-02-11 NOTE — Progress Notes (Signed)
Patient ID: [name], female   DOB: 17-May-1946, 70 y.o.   MRN: [id number]    Subjective: Feels about the same. Left lower quadrant pain is improved with medications.  Objective: Vital signs in last 24 hours: Temp:  [98.6 F (37 C)-100.5 F (38.1 C)] 99 F (37.2 C) (02/23 0528) Pulse Rate:  [79-95] 93 (02/23 0528) Resp:  [14-18] 14 (02/23 0528) BP: (131-156)/(43-63) 141/46 mmHg (02/23 0528) SpO2:  [94 %-100 %] 98 % (02/23 0528) Weight:  [65.772 kg (145 lb)] 65.772 kg (145 lb) (02/22 1251) Last BM Date: 01/26/16  Intake/Output from previous day: 02/22 0701 - 02/23 0700 In: 975 [I.V.:975] Out: 1525 [Urine:1525] Intake/Output this shift: Total I/O In: -  Out: 150 [Urine:150]  General appearance: alert, cooperative and no distress GI: minimal distention. Tender in the left mid abdomen and left lower quadrant unchanged. Minimal output from colostomy. Colostomy appears necrotic but as per yesterday looks viable just beneath the skin surface.  Lab Results:   Recent Labs  02/10/16 1342 02/10/16 1352 02/11/16 0431  WBC 11.7*  --  12.0*  HGB 9.7* 9.9* 8.4*  HCT 29.0* 29.0* 25.8*  PLT 745*  --  709*   BMET  Recent Labs  02/10/16 1342 02/10/16 1352 02/11/16 0431  NA 135 134* 137  K 4.1 4.0 3.8  CL 99* 96* 105  CO2 25  --  22  GLUCOSE 107* 112* 148*  BUN '19 18 14  '$ CREATININE 0.65 0.60 0.51  CALCIUM 8.4*  --  8.0*     Studies/Results: Ct Abdomen Pelvis W Contrast  02/10/2016  CLINICAL DATA:  Intermittent abdominal pain. Left hip and groin pain. Leukocytosis. Necrosis at a colostomy stoma. Redness at a urostomy stoma. Ostomy stomal pain. Status post cystectomy, urostomy, hysterectomy, bilateral salpingo-oophorectomy, lymphadenectomy, lysis of adhesions, colostomy and sigmoid colectomy on 01/27/2016. Previous colon resection for colon cancer on 05/2012. History of bladder cancer. Previous chemotherapy completed. Additional chemotherapy and radiation therapy planned.  EXAM: CT ABDOMEN AND PELVIS WITH CONTRAST TECHNIQUE: Multidetector CT imaging of the abdomen and pelvis was performed using the standard protocol following bolus administration of intravenous contrast. CONTRAST:  191m OMNIPAQUE IOHEXOL 300 MG/ML  SOLN COMPARISON:  Abdomen radiograph dated 01/27/2016 and abdomen and pelvis CT dated 11/06/2015. FINDINGS: Lower chest: Interval mild right lower lobe atelectasis and minimal left lower lobe atelectasis. No pleural fluid. Hepatobiliary: Normal appearing liver and gallbladder. Pancreas: No mass, inflammatory changes, or other significant abnormality. Spleen: Within normal limits in size and appearance. Adrenals/Urinary Tract: Mild dilatation of both renal collecting systems. Bilateral of ureteral stent high are views are extending out the patient's urostomy, into the urostomy bag. Surgically absent urinary bladder. Normal appearing adrenal glands. Stomach/Bowel: No gastric or bowel dilatation. The distal transverse colon is exiting a left lower quadrant ostomy. The left colon and majority of the sigmoid colon is surgically absent. There is a surgical staple line at the inferior aspect of the cecum with the remainder of the sigmoid colon in contact with that portion of the cecum. These are possibly anastomosed. Vascular/Lymphatic: Atheromatous arterial calcifications. No enlarged lymph nodes. Reproductive: Surgically absent uterus and ovaries. Other: There is a large intraperitoneal and retroperitoneal fluid collection in the mid and lower abdomen and upper pelvis on the left. There is also multiple ocular saw of extraluminal air in that region and in the anterior abdomen. This fluid collection measures 14.7 x 10.5 cm on image number 58 of series 2 and 17.0 cm in length on coronal image  number 42. No surrounding membrane more enhancement. There is also adjacent mesenteric soft tissue stranding/edema. Musculoskeletal: Lumbar and lower thoracic spine degenerative changes.  IMPRESSION: 1. Large intraperitoneal and retroperitoneal fluid collection on the left, measuring 17.0 x 14.7 x 10.5 cm. This could represent a seroma or urinoma. 2. Small amount of residual postoperative air. Bowel perforation could also produce this appearance. 3. Mild bilateral hydronephrosis. 4. Mild bilateral lower lobe atelectasis. Electronically Signed   By: Claudie Revering M.D.   On: 02/10/2016 16:36    Anti-infectives: Anti-infectives    None      Assessment/Plan: Status post recent colectomy and colostomy, cystectomy and retroperitoneal lymph node dissection with urostomy via colonic conduit.  CT as above shows large fluid collection which I suspect is a seroma. Ischemia of colostomy which I believe is superficial and may not require revision.  No evidence of bowel obstruction or other bowel issue on CT. She may have an ileus or low colostomy output due to pressure from the seroma. We will cover with antibiotics for now with elevated white count and this could be a infected fluid collection. Have asked IR regarding percutaneous drainage. Urology notified the patient's admission    LOS: 1 day    Jewel Mcafee T 02/11/2016

## 2016-02-11 NOTE — Progress Notes (Signed)
   Discussed with IR, the patient likely has a uretero colonic anastomosis leak.  Spoke with Dr. Junious Silk, will evaluate.  Given that this is a primary urologic problem, would appreciate urology team taking onto their service.  We will continue to follow.  Await cultures.  Continue with Primaxin.  Resume diet.  Erby Pian, ANP-BC Pager [phone]

## 2016-02-11 NOTE — Progress Notes (Addendum)
Subjective: Pt is a 70 yo female s/p robotic converted to open radical cystectomy/pelvic exenteration/colon conduit urinary diversion/sigmoid colectomy/end colostomy on 01/27/16.  She was d/c'd home on POD 8 doing well.  Yesterday she began having lower abdominal pain and a necrotic appearing stoma.  She presented to the ED yesterday and was found to have a large intra-abdominal fluid collection on CT that was concerning for urinoma, seroma, or abscess.  She was admitted, started on ABx,  and IR placed a drain in the collection.  Culture of fluid collection is pending.  Her colostomy is necrotic appearing to the level of the skin but is viable per general surgery.    Cr and WBC WNL.  Ureteral stents in place on CT.  She states she has maintained good UO in the bag since discharge.   She is currently resting comfortably without complaints.       Objective: Vital signs in last 24 hours: Temp:  [98.4 F (36.9 C)-100.5 F (38.1 C)] 98.9 F (37.2 C) (02/23 1451) Pulse Rate:  [81-95] 92 (02/23 1451) Resp:  [13-22] 20 (02/23 1451) BP: (118-161)/(37-82) 161/45 mmHg (02/23 1451) SpO2:  [92 %-98 %] 97 % (02/23 1451)  Intake/Output from previous day: 02/22 0701 - 02/23 0700 In: 975 [I.V.:975] Out: 1525 [Urine:1525] Intake/Output this shift: Total I/O In: 608.8 [I.V.:608.8] Out: 1350 [Urine:875; Drains:475]  Physical Exam:  General:alert, cooperative and no distress GI: soft, non tender,  no palpable masses, colostomy dark with very little liquid output in bag; urostomy pink with stents visible in bag LLQ drain in place with C/D dressing; yellow fluid in bag   Lab Results:  Recent Labs  02/10/16 1342 02/10/16 1352 02/11/16 0431  HGB 9.7* 9.9* 8.4*  HCT 29.0* 29.0* 25.8*   BMET  Recent Labs  02/10/16 1342 02/10/16 1352 02/11/16 0431  NA 135 134* 137  K 4.1 4.0 3.8  CL 99* 96* 105  CO2 25  --  22  GLUCOSE 107* 112* 148*  BUN '19 18 14  '$ CREATININE 0.65 0.60 0.51  CALCIUM  8.4*  --  8.0*    Recent Labs  02/11/16 0815  INR 1.25   No results for input(s): LABURIN in the last 72 hours. Results for orders placed or performed during the hospital encounter of 02/10/16  Blood culture (routine x 2)     Status: None (Preliminary result)   Collection Time: 02/10/16  1:30 PM  Result Value Ref Range Status   Specimen Description BLOOD RIGHT ANTECUBITAL  Final   Special Requests BOTTLES DRAWN AEROBIC AND ANAEROBIC 5 CC EA  Final   Culture   Final    NO GROWTH < 24 HOURS Performed at [facility]    Report Status PENDING  Incomplete  Blood culture (routine x 2)     Status: None (Preliminary result)   Collection Time: 02/10/16  1:40 PM  Result Value Ref Range Status   Specimen Description BLOOD LEFT ANTECUBITAL  Final   Special Requests BOTTLES DRAWN AEROBIC AND ANAEROBIC 5 CC EA  Final   Culture   Final    NO GROWTH < 24 HOURS Performed at [facility]    Report Status PENDING  Incomplete  MRSA PCR Screening     Status: Abnormal   Collection Time: 02/10/16  8:18 PM  Result Value Ref Range Status   MRSA by PCR POSITIVE (A) NEGATIVE Final    Comment:        The GeneXpert MRSA Assay (FDA approved  for NASAL specimens only), is one component of a comprehensive MRSA colonization surveillance program. It is not intended to diagnose MRSA infection nor to guide or monitor treatment for MRSA infections. RESULT CALLED TO, READ BACK BY AND VERIFIED WITH: P.PELOTTE,RN AT 2308 ON 02/10/16 BY W.SHEA     Studies/Results: Ct Abdomen Pelvis W Contrast  02/10/2016  CLINICAL DATA:  Intermittent abdominal pain. Left hip and groin pain. Leukocytosis. Necrosis at a colostomy stoma. Redness at a urostomy stoma. Ostomy stomal pain. Status post cystectomy, urostomy, hysterectomy, bilateral salpingo-oophorectomy, lymphadenectomy, lysis of adhesions, colostomy and sigmoid colectomy on 01/27/2016. Previous colon resection for colon cancer on 05/2012. History of  bladder cancer. Previous chemotherapy completed. Additional chemotherapy and radiation therapy planned. EXAM: CT ABDOMEN AND PELVIS WITH CONTRAST TECHNIQUE: Multidetector CT imaging of the abdomen and pelvis was performed using the standard protocol following bolus administration of intravenous contrast. CONTRAST:  124m OMNIPAQUE IOHEXOL 300 MG/ML  SOLN COMPARISON:  Abdomen radiograph dated 01/27/2016 and abdomen and pelvis CT dated 11/06/2015. FINDINGS: Lower chest: Interval mild right lower lobe atelectasis and minimal left lower lobe atelectasis. No pleural fluid. Hepatobiliary: Normal appearing liver and gallbladder. Pancreas: No mass, inflammatory changes, or other significant abnormality. Spleen: Within normal limits in size and appearance. Adrenals/Urinary Tract: Mild dilatation of both renal collecting systems. Bilateral of ureteral stent high are views are extending out the patient's urostomy, into the urostomy bag. Surgically absent urinary bladder. Normal appearing adrenal glands. Stomach/Bowel: No gastric or bowel dilatation. The distal transverse colon is exiting a left lower quadrant ostomy. The left colon and majority of the sigmoid colon is surgically absent. There is a surgical staple line at the inferior aspect of the cecum with the remainder of the sigmoid colon in contact with that portion of the cecum. These are possibly anastomosed. Vascular/Lymphatic: Atheromatous arterial calcifications. No enlarged lymph nodes. Reproductive: Surgically absent uterus and ovaries. Other: There is a large intraperitoneal and retroperitoneal fluid collection in the mid and lower abdomen and upper pelvis on the left. There is also multiple ocular saw of extraluminal air in that region and in the anterior abdomen. This fluid collection measures 14.7 x 10.5 cm on image number 58 of series 2 and 17.0 cm in length on coronal image number 42. No surrounding membrane more enhancement. There is also adjacent mesenteric  soft tissue stranding/edema. Musculoskeletal: Lumbar and lower thoracic spine degenerative changes. IMPRESSION: 1. Large intraperitoneal and retroperitoneal fluid collection on the left, measuring 17.0 x 14.7 x 10.5 cm. This could represent a seroma or urinoma. 2. Small amount of residual postoperative air. Bowel perforation could also produce this appearance. 3. Mild bilateral hydronephrosis. 4. Mild bilateral lower lobe atelectasis. Electronically Signed   By: SClaudie ReveringM.D.   On: 02/10/2016 16:36   Ct Image Guided Drainage By Percutaneous Catheter  02/11/2016  INDICATION: 70year old female status post extensive pelvic surgery with right lower quadrant diverting urinary conduit (colonic). She has a large left retroperitoneal fluid collection concerning for postoperative abscess versus urinoma. EXAM: CT GUIDED DRAINAGE OF LEFT RETROPERITONEAL ABSCESS MEDICATIONS: The patient is currently admitted to the hospital and receiving intravenous antibiotics. The antibiotics were administered within an appropriate time frame prior to the initiation of the procedure. ANESTHESIA/SEDATION: 1.5 mg IV Versed 50 mcg IV Fentanyl Moderate Sedation Time:  31 The patient was continuously monitored during the procedure by the interventional radiology nurse under my direct supervision. COMPLICATIONS: None immediate. TECHNIQUE: Informed written consent was obtained from the patient after a thorough discussion of  the procedural risks, benefits and alternatives. All questions were addressed. Maximal Sterile Barrier Technique was utilized including caps, mask, sterile gowns, sterile gloves, sterile drape, hand hygiene and skin antiseptic. A timeout was performed prior to the initiation of the procedure. PROCEDURE: Informed written consent was obtained from the patient. A time-out procedure was performed. A planning CT scan was performed. The largest left lower retroperitoneal fluid collection is now filled with excreted contrast  material. The excreted contrast can be seen tracking directly to the right ureterocolonic anastomosis. These findings are consistent with a urinary leak. A suitable skin entry site was selected and marked. Local anesthesia was attained by infiltration with 1% lidocaine. A small dermatotomy was made. Using intermittent CT fluoroscopic guidance, an 18 gauge trocar needle was advanced to the left lower quadrant abdominal wall and into the complex fluid collection. An Amplatz wire was then advanced in a cephalad direction. The needle was removed and the tract dilated to 12 [place]. A Cook 38 [place] biliary drain with multiple sideholes was then advanced over the wire and formed. Aspiration yields approximately 900 mL foul-smelling frankly purulent fluid. A sample was sent for Gram stain and culture. Post drain placement imaging demonstrates near-total resolution of the fluid and contrast collection. The sideholes of the biliary drainage catheter span the expected location of the urine leak. The drain was fixed in place with 0 Prolene suture and an adhesive fixation device. A was connected to gravity bag drainage. Overall, the patient tolerated the procedure well. FINDINGS: Large left retroperitoneal urinoma secondary to leak at the right ureterocolonic anastomosis. IMPRESSION: 1. Imaging findings consistent with leak at the right ureterocolonic anastomosis with large left retroperitoneal urinoma. 2. Successful placement of a multi side-hole 14 French biliary drainage catheter with aspiration of 900 mL foul-smelling purulent fluid. A sample was sent for Gram stain and culture. Signed, Criselda Peaches, MD [facility] Electronically Signed   By: Jacqulynn Cadet M.D.   On: 02/11/2016 14:47    Assessment/Plan:      Anastomotic urine leak--Continue drain.  Send fluid for Cr.  Maintain stents.  F/u fluid culture.  This should heal with time and adequate  drainage.    LOS: 1 day   DANCY, AMANDA 02/11/2016, 4:07 PM   Pt seen and examined on rounds. I agree with PA Dancy A/P. Discussed pt with PA Dancy. Pt feeling "better". On reg diet. Colon conduit pink and viable. Urine in bag. Perc drain with 100 ml fluid. Cx pending (GN's and G+ on gram stain). Pt on Primaxin and Lovenox. Ok to transfer to GU service. Appreciate Gen Surg care. Will follow conduit and perc drain outpt. Kidney fxn normal.

## 2016-02-11 NOTE — Care Management Note (Signed)
Case Management Note  Patient Details  Name: [name] MRN: [id number] Date of Birth: December 07, 1946  Subjective/Objective:    Admitted with abdominal pain, necrotic ostomy                Action/Plan: Discharge planning, patient was active with [facility] from previous admission. Will need resumption of services order prior to d/c.   Expected Discharge Date:   (unknown)               Expected Discharge Plan:  [name]  In-House Referral:  NA  Discharge planning Services  CM Consult  Post Acute Care Choice:  Home Health, Resumption of Svcs/PTA Provider Choice offered to:  Patient  DME Arranged:  N/A DME Agency:  NA  HH Arranged:  RN, PT, OT [place] Agency:  [place]  Status of Service:  In process, will continue to follow  Medicare Important Message Given:    Date Medicare IM Given:    Medicare IM give by:    Date Additional Medicare IM Given:    Additional Medicare Important Message give by:     If discussed at [place] of Stay Meetings, dates discussed:    Additional Comments:  Guadalupe Maple, RN 02/11/2016, 1:19 PM

## 2016-02-11 NOTE — Consult Note (Signed)
Chief Complaint: intra-abdominal fluid collection Referring Physician:Dr. Excell Seltzer HPI: [name] is an 70 y.o. female who underwent a robotic converted to open radical cystectomy with pelvic exenteration, hysterectomy, bil salingo-oophorectomy, total urethrectomy, pelvic lymphadenectomy, colon conduit urinary diversion by Dr. Tresa Moore and a lysis of adhesions, sigmoid colectomy with resection of prior anastomosis, end colostomy--Dr. Dalbert Batman and Gross 01/27/16 for bladder cancer and a history of colon cancer and bowel resection.  She began having increasing abdominal pain along with a necrotic appearing stoma at home.  She had a low-grade fever along with some anorexia.  She came to [facility] yesterday and had a CT scan that revealed a large intra-abdominal fluid collection concerning for urinoma, seroma, or abscess.  She has been admitted and placed on abx therapy and we have been asked to see her a percuntaneous drain.  Past Medical History:  Past Medical History  Diagnosis Date  . Bladder cancer ([place])     Bladder and Colon 2014  . Colon cancer [facility])     Past Surgical History:  Past Surgical History  Procedure Laterality Date  . Colon surgery  November 2014  . Robot assisted laparoscopic complete cystect ileal conduit N/A 01/27/2016    Procedure: XI ROBOTIC ASSISTED LAPAROSCOPIC COMPLETE CYSTECT ILEAL CONDUIT, HYSTERECTOMY, BILATERAL OOPHORECTOMY,  CYSTOSCOPY WITH INDOCYANINE GREEN DYE CONVERTED TO OPEN;  Surgeon: Alexis Frock, MD;  Location: [facility];  Service: Urology;  Laterality: N/A;  . Lymphadenectomy N/A 01/27/2016    Procedure: LYMPHADENECTOMY;  Surgeon: Alexis Frock, MD;  Location: [facility];  Service: Urology;  Laterality: N/A;  . Lymphadenectomy  01/27/2016    Procedure: LYMPHADENECTOMY;  Surgeon: Fanny Skates, MD;  Location: [facility];  Service: General;;  . Laparoscopic lysis of adhesions  01/27/2016    Procedure: EXTENSIVE  LYSIS OF ADHESIONS, REPAIR OF ADHESIONS;  Surgeon:  Fanny Skates, MD;  Location: [facility];  Service: General;;  SPLENIC FLEXURE MOBILIZATION  . Colostomy closure  01/27/2016    Procedure: END COLOSTOMY;  Surgeon: Fanny Skates, MD;  Location: [facility];  Service: General;;  . Partial colectomy N/A 01/27/2016    Procedure: SIGMOID  COLECTOMY;  Surgeon: Fanny Skates, MD;  Location: [facility];  Service: General;  Laterality: N/A;    Family History: History reviewed. No pertinent family history.  Social History:  reports that she quit smoking about 3 years ago. She has never used smokeless tobacco. She reports that she does not drink alcohol or use illicit drugs.  Allergies:  Allergies  Allergen Reactions  . Ciprofloxacin Shortness Of Breath and Swelling  . Penicillins Hives    Has patient had a PCN reaction causing immediate rash, facial/tongue/throat swelling, SOB or lightheadedness with hypotension: No Has patient had a PCN reaction causing severe rash involving mucus membranes or skin necrosis: No Has patient had a PCN reaction that required hospitalization Yes Has patient had a PCN reaction occurring within the last 10 years: No If all of the above answers are "NO", then may proceed with Cephalosporin use.    Medications:   Medication List    ASK your doctor about these medications        isoniazid 300 MG tablet  Commonly known as:  NYDRAZID  Take 300 mg by mouth daily.     ketorolac 10 MG tablet  Commonly known as:  TORADOL  Take 1 tablet (10 mg total) by mouth every 8 (eight) hours as needed for moderate pain. Post-operatively     oxyCODONE-acetaminophen 5-325 MG tablet  Commonly known as:  PERCOCET/ROXICET  Take 1-2 tablets by mouth every 6 (six) hours as needed for severe pain. Post-operatively     STOOL SOFTENER PO  Take 1 capsule by mouth daily as needed (constipation.).        Please HPI for pertinent positives, otherwise complete 10 system ROS negative.  Mallampati Score: MD Evaluation Airway: WNL Heart:  WNL Abdomen: WNL Chest/ Lungs: WNL ASA  Classification: 3 Mallampati/Airway Score: Two  Physical Exam: BP 145/50 mmHg  Pulse 90  Temp(Src) 98.4 F (36.9 C) (Oral)  Resp 14  Ht 5' 5"  (1.651 m)  Wt 145 lb (65.772 kg)  BMI 24.13 kg/m2  SpO2 97% Body mass index is 24.13 kg/(m^2). General: pleasant, obese white female who appears in mild distress secondary to pain HEENT: head is normocephalic, atraumatic.  Sclera are noninjected.  PERRL.  Ears and nose without any masses or lesions.  Mouth is pink but dry Heart: regular, rate, and rhythm.  Normal s1,s2. No obvious murmurs, gallops, or rubs noted.  Palpable radial and pedal pulses bilaterally Lungs: CTAB, no wheezes, rhonchi, or rales noted.  Respiratory effort nonlabored Abd: soft, very tender on left side, less so on right, bloated, hypoactive BS, no masses, hernias, or organomegaly.  Midline incision is c/d/i with staples.  LLQ colostomy in place with minimal output.  Stoma is necrotic.  RLQ urostomy present with urine. MS: all 4 extremities are symmetrical with no cyanosis, clubbing, or edema. Psych: A&Ox3 with an appropriate affect.   Labs: Results for orders placed or performed during the hospital encounter of 02/10/16 (from the past 48 hour(s))  CBC with Differential     Status: Abnormal   Collection Time: 02/10/16  1:42 PM  Result Value Ref Range   WBC 11.7 (H) 4.0 - 10.5 K/uL   RBC 3.28 (L) 3.87 - 5.11 MIL/uL   Hemoglobin 9.7 (L) 12.0 - 15.0 g/dL   HCT 29.0 (L) 36.0 - 46.0 %   MCV 88.4 78.0 - 100.0 fL   MCH 29.6 26.0 - 34.0 pg   MCHC 33.4 30.0 - 36.0 g/dL   RDW 13.9 11.5 - 15.5 %   Platelets 745 (H) 150 - 400 K/uL   Neutrophils Relative % 78 %   Lymphocytes Relative 10 %   Monocytes Relative 12 %   Eosinophils Relative 0 %   Basophils Relative 0 %   Neutro Abs 9.1 (H) 1.7 - 7.7 K/uL   Lymphs Abs 1.2 0.7 - 4.0 K/uL   Monocytes Absolute 1.4 (H) 0.1 - 1.0 K/uL   Eosinophils Absolute 0.0 0.0 - 0.7 K/uL   Basophils  Absolute 0.0 0.0 - 0.1 K/uL   Smear Review MORPHOLOGY UNREMARKABLE   Comprehensive metabolic panel     Status: Abnormal   Collection Time: 02/10/16  1:42 PM  Result Value Ref Range   Sodium 135 135 - 145 mmol/L   Potassium 4.1 3.5 - 5.1 mmol/L   Chloride 99 (L) 101 - 111 mmol/L   CO2 25 22 - 32 mmol/L   Glucose, Bld 107 (H) 65 - 99 mg/dL   BUN 19 6 - 20 mg/dL   Creatinine, Ser 0.65 0.44 - 1.00 mg/dL   Calcium 8.4 (L) 8.9 - 10.3 mg/dL   Total Protein 6.5 6.5 - 8.1 g/dL   Albumin 2.4 (L) 3.5 - 5.0 g/dL   AST 106 (H) 15 - 41 U/L   ALT 154 (H) 14 - 54 U/L   Alkaline Phosphatase 104 38 - 126 U/L  Total Bilirubin 0.9 0.3 - 1.2 mg/dL   GFR calc non Af Amer >60 >60 mL/min   GFR calc Af Amer >60 >60 mL/min    Comment: (NOTE) The eGFR has been calculated using the CKD EPI equation. This calculation has not been validated in all clinical situations. eGFR's persistently <60 mL/min signify possible Chronic Kidney Disease.    Anion gap 11 5 - 15  I-Stat Chem 8, ED     Status: Abnormal   Collection Time: 02/10/16  1:52 PM  Result Value Ref Range   Sodium 134 (L) 135 - 145 mmol/L   Potassium 4.0 3.5 - 5.1 mmol/L   Chloride 96 (L) 101 - 111 mmol/L   BUN 18 6 - 20 mg/dL   Creatinine, Ser 0.60 0.44 - 1.00 mg/dL   Glucose, Bld 112 (H) 65 - 99 mg/dL   Calcium, Ion 1.05 (L) 1.13 - 1.30 mmol/L   TCO2 25 0 - 100 mmol/L   Hemoglobin 9.9 (L) 12.0 - 15.0 g/dL   HCT 29.0 (L) 36.0 - 46.0 %  I-Stat CG4 Lactic Acid, ED     Status: None   Collection Time: 02/10/16  1:53 PM  Result Value Ref Range   Lactic Acid, Venous 1.44 0.5 - 2.0 mmol/L  MRSA PCR Screening     Status: Abnormal   Collection Time: 02/10/16  8:18 PM  Result Value Ref Range   MRSA by PCR POSITIVE (A) NEGATIVE    Comment:        The GeneXpert MRSA Assay (FDA approved for NASAL specimens only), is one component of a comprehensive MRSA colonization surveillance program. It is not intended to diagnose MRSA infection nor to guide  or monitor treatment for MRSA infections. RESULT CALLED TO, READ BACK BY AND VERIFIED WITH: P.PELOTTE,RN AT 2308 ON 02/10/16 BY W.SHEA   Basic metabolic panel     Status: Abnormal   Collection Time: 02/11/16  4:31 AM  Result Value Ref Range   Sodium 137 135 - 145 mmol/L   Potassium 3.8 3.5 - 5.1 mmol/L   Chloride 105 101 - 111 mmol/L   CO2 22 22 - 32 mmol/L   Glucose, Bld 148 (H) 65 - 99 mg/dL   BUN 14 6 - 20 mg/dL   Creatinine, Ser 0.51 0.44 - 1.00 mg/dL   Calcium 8.0 (L) 8.9 - 10.3 mg/dL   GFR calc non Af Amer >60 >60 mL/min   GFR calc Af Amer >60 >60 mL/min    Comment: (NOTE) The eGFR has been calculated using the CKD EPI equation. This calculation has not been validated in all clinical situations. eGFR's persistently <60 mL/min signify possible Chronic Kidney Disease.    Anion gap 10 5 - 15  CBC     Status: Abnormal   Collection Time: 02/11/16  4:31 AM  Result Value Ref Range   WBC 12.0 (H) 4.0 - 10.5 K/uL   RBC 2.88 (L) 3.87 - 5.11 MIL/uL   Hemoglobin 8.4 (L) 12.0 - 15.0 g/dL   HCT 25.8 (L) 36.0 - 46.0 %   MCV 89.6 78.0 - 100.0 fL   MCH 29.2 26.0 - 34.0 pg   MCHC 32.6 30.0 - 36.0 g/dL   RDW 14.2 11.5 - 15.5 %   Platelets 709 (H) 150 - 400 K/uL  Protime-INR     Status: Abnormal   Collection Time: 02/11/16  8:15 AM  Result Value Ref Range   Prothrombin Time 15.8 (H) 11.6 - 15.2 seconds   INR 1.25  0.00 - 1.49    Imaging: Ct Abdomen Pelvis W Contrast  02/10/2016  CLINICAL DATA:  Intermittent abdominal pain. Left hip and groin pain. Leukocytosis. Necrosis at a colostomy stoma. Redness at a urostomy stoma. Ostomy stomal pain. Status post cystectomy, urostomy, hysterectomy, bilateral salpingo-oophorectomy, lymphadenectomy, lysis of adhesions, colostomy and sigmoid colectomy on 01/27/2016. Previous colon resection for colon cancer on 05/2012. History of bladder cancer. Previous chemotherapy completed. Additional chemotherapy and radiation therapy planned. EXAM: CT ABDOMEN  AND PELVIS WITH CONTRAST TECHNIQUE: Multidetector CT imaging of the abdomen and pelvis was performed using the standard protocol following bolus administration of intravenous contrast. CONTRAST:  150m OMNIPAQUE IOHEXOL 300 MG/ML  SOLN COMPARISON:  Abdomen radiograph dated 01/27/2016 and abdomen and pelvis CT dated 11/06/2015. FINDINGS: Lower chest: Interval mild right lower lobe atelectasis and minimal left lower lobe atelectasis. No pleural fluid. Hepatobiliary: Normal appearing liver and gallbladder. Pancreas: No mass, inflammatory changes, or other significant abnormality. Spleen: Within normal limits in size and appearance. Adrenals/Urinary Tract: Mild dilatation of both renal collecting systems. Bilateral of ureteral stent high are views are extending out the patient's urostomy, into the urostomy bag. Surgically absent urinary bladder. Normal appearing adrenal glands. Stomach/Bowel: No gastric or bowel dilatation. The distal transverse colon is exiting a left lower quadrant ostomy. The left colon and majority of the sigmoid colon is surgically absent. There is a surgical staple line at the inferior aspect of the cecum with the remainder of the sigmoid colon in contact with that portion of the cecum. These are possibly anastomosed. Vascular/Lymphatic: Atheromatous arterial calcifications. No enlarged lymph nodes. Reproductive: Surgically absent uterus and ovaries. Other: There is a large intraperitoneal and retroperitoneal fluid collection in the mid and lower abdomen and upper pelvis on the left. There is also multiple ocular saw of extraluminal air in that region and in the anterior abdomen. This fluid collection measures 14.7 x 10.5 cm on image number 58 of series 2 and 17.0 cm in length on coronal image number 42. No surrounding membrane more enhancement. There is also adjacent mesenteric soft tissue stranding/edema. Musculoskeletal: Lumbar and lower thoracic spine degenerative changes. IMPRESSION: 1. Large  intraperitoneal and retroperitoneal fluid collection on the left, measuring 17.0 x 14.7 x 10.5 cm. This could represent a seroma or urinoma. 2. Small amount of residual postoperative air. Bowel perforation could also produce this appearance. 3. Mild bilateral hydronephrosis. 4. Mild bilateral lower lobe atelectasis. Electronically Signed   By: SClaudie ReveringM.D.   On: 02/10/2016 16:36    Assessment/Plan 1. Intra-abdominal fluid collection, s/probotic converted to open radical cystectomy with pelvic exenteration, hysterectomy, bil salingo-oophorectomy, total urethrectomy, pelvic lymphadenectomy, colon conduit urinary diversion by Dr. MTresa Mooreand a lysis of adhesions, sigmoid colectomy with resection of prior anastomosis, end colostomy--Dr. IDalbert Batmanand Gross 01/27/16 for bladder cancer and a history of colon cancer and bowel resection.  -case d/w Dr. MLaurence Ferrari  We will plan on placement a large LLQ drain today. -check INR -remain NPO -hold lovenox today -Risks and Benefits discussed with the patient including bleeding, infection, damage to adjacent structures, bowel perforation/fistula connection, and sepsis. All of the patient's questions were answered, patient is agreeable to proceed. Consent signed and in chart.    Thank you for this interesting consult.  I greatly enjoyed meeting PKISSIE ZIOLKOWSKIand look forward to participating in their care.  A copy of this report was sent to the requesting provider on this date.  Electronically Signed: OHenreitta Cea2/23/2017, 9:43 AM   I spent  a total of 40 Minutes   in face to face in clinical consultation, greater than 50% of which was counseling/coordinating care for intra-abdominal fluid collection

## 2016-02-11 NOTE — Progress Notes (Signed)
Pharmacy Antibiotic Note  [name] is a 70 y.o. female with bladder cancer s/p cystectomy/urostomy/TAH/colectomy/colostomy on  01/27/2016 and recently discharged from [facility] on [facility]/05/16.  She presented back to the hospital on 2/22 with c/o pain to stoma and shooting to the back.  CCS suspects superficial ischemia of her colostomy.  Patient now has elevated WBC.  To start primaxin for suspected intra-abdominal infection.  Of note, patient has PCN listed as an allergy (MD is aware) with rxn noted as hives.  Cross-reactivity between PCN and carbapenem is low.   Plan: - primaxin 500 mg IV q8h - RN informed to monitor patient closely for any allergic rxns with first dose of primaxin  Height: '5\' 5"'$  (165.1 cm) Weight: 145 lb (65.772 kg) IBW/kg (Calculated) : 57  Temp (24hrs), Avg:99.6 F (37.6 C), Min:98.6 F (37 C), Max:100.5 F (38.1 C)   Recent Labs Lab 02/10/16 1342 02/10/16 1352 02/10/16 1353 02/11/16 0431  WBC 11.7*  --   --  12.0*  CREATININE 0.65 0.60  --  0.51  LATICACIDVEN  --   --  1.44  --     Estimated Creatinine Clearance: 59.7 mL/min (by C-G formula based on Cr of 0.51).    Allergies  Allergen Reactions  . Ciprofloxacin Shortness Of Breath and Swelling  . Penicillins Hives    Has patient had a PCN reaction causing immediate rash, facial/tongue/throat swelling, SOB or lightheadedness with hypotension: No Has patient had a PCN reaction causing severe rash involving mucus membranes or skin necrosis: No Has patient had a PCN reaction that required hospitalization Yes Has patient had a PCN reaction occurring within the last 10 years: No If all of the above answers are "NO", then may proceed with Cephalosporin use.   Antimicrobials this admission: 2/23 primaxin>>  Levels/dose changes this admission: n/a  Microbiology results: 2/22 MRSA PCR (+) on chlx and bactroban 2/22 bcx x2:    Thank you for allowing pharmacy to be a part of this patient's care.  Lynelle Doctor 02/11/2016 8:23 AM

## 2016-02-11 NOTE — Procedures (Signed)
Interventional Radiology Procedure Note  Procedure: Placement of a 63F biliary drain into the LLQ fluid collection.  Multiple side holes positioned across the suspected location of the leak.   900 mL foul smelling purulent fluid aspirated.  A sample was sent for culture.  Complications: None  Estimated Blood Loss: - None  Recommendations: - Maintain to bad drainage, monitor output  Signed,  Criselda Peaches, MD

## 2016-02-11 NOTE — Progress Notes (Signed)
Dr Junious Silk, urologist made aware of results of peritoneal cavity. No new order.

## 2016-02-12 DIAGNOSIS — R188 Other ascites: Secondary | ICD-10-CM | POA: Insufficient documentation

## 2016-02-12 LAB — BASIC METABOLIC PANEL
Anion gap: 6 (ref 5–15)
BUN: 11 mg/dL (ref 6–20)
CO2: 25 mmol/L (ref 22–32)
Calcium: 7.9 mg/dL — ABNORMAL LOW (ref 8.9–10.3)
Chloride: 107 mmol/L (ref 101–111)
Creatinine, Ser: 0.63 mg/dL (ref 0.44–1.00)
GFR calc non Af Amer: >60 mL/min (ref >60)
GLUCOSE: 165 mg/dL — AB (ref 65–99)
Potassium: 3.6 mmol/L (ref 3.5–5.1)
SODIUM: 138 mmol/L (ref 135–145)

## 2016-02-12 LAB — CBC
HCT: 24.4 % — ABNORMAL LOW (ref 36.0–46.0)
Hemoglobin: 7.8 g/dL — ABNORMAL LOW (ref 12.0–15.0)
MCH: 28.8 pg (ref 26.0–34.0)
MCHC: 32 g/dL (ref 30.0–36.0)
MCV: 90 fL (ref 78.0–100.0)
PLATELETS: 611 10*3/uL — AB (ref 150–400)
RBC: 2.71 MIL/uL — ABNORMAL LOW (ref 3.87–5.11)
RDW: 14.5 % (ref 11.5–15.5)
WBC: 8.3 10*3/uL (ref 4.0–10.5)

## 2016-02-12 LAB — CREATININE, FLUID (PLEURAL, PERITONEAL, JP DRAINAGE): CREAT FL: 27.8 mg/dL

## 2016-02-12 MED ORDER — ENSURE ENLIVE PO LIQD
237.0000 mL | ORAL | Status: DC
  Administered 2016-02-12 – 2016-02-18 (×6): 237 mL via ORAL

## 2016-02-12 MED ORDER — OXYCODONE-ACETAMINOPHEN 5-325 MG PO TABS
1.0000 | ORAL_TABLET | ORAL | Status: DC | PRN
  Administered 2016-02-12: 2 via ORAL
  Administered 2016-02-12: 1 via ORAL
  Filled 2016-02-12: qty 1
  Filled 2016-02-12: qty 2

## 2016-02-12 NOTE — Progress Notes (Signed)
Initial Nutrition Assessment  INTERVENTION:  -Provide Ensure Enlive daily, 350 kcal, 20 grams of protein per serving. -Encourage PO intake   NUTRITION DIAGNOSIS:   Inadequate oral intake related to poor appetite as evidenced by per patient/family report, energy intake < 75% for > 7 days, percent weight loss.  GOAL:   Patient will meet greater than or equal to 90% of their needs  MONITOR:   PO intake, Supplement acceptance, Labs, Weight trends, Skin  REASON FOR ASSESSMENT:   Malnutrition Screening Tool  ASSESSMENT:   [name] is a 70 y.o. female complaining of with a history of bladder cancer and is s/p ileal conduit/urostomy 01/27/2016 by Dr. Tresa Moore patient has history of colon cancer and she had significant adhesions, Dr. Johney Maine performed partial colectomy with colostomy during the same OR visit. She presents today with aching abdominal pain radiating around to her back that she rates 8/10. She had a colostomy bag and urostomy bag placed February 8th and was discharged from the hospital last Friday.  Pt seen for MST. Pt states her appetite has been poor since her surgery on 01/27/2016. She was trying to consume 3 meals but would only be able to take a few bites. During this time pt reports she was experiencing taste alterations, foods were tasting bland or just tasting "off". Pt reports her appetite has been better. She does not consume nutritional supplemental drinks PTA, but willing to try Ensure daily until her appetite is fully back to normal.   Pt does not know if she has lost weight recently. Per chart pt has lost 13 lb (9% body weight) within one month, which is significant for time frame  Conducted nutrition focused physical exam, identified no muscle or fat wasting.   Due to poor PO intake and 9% weight loss pt is classified as non-severe malnourished in the context of acute illness.    Medications reviewed. Labs reviewed.   Diet Order:  Diet regular Room service  appropriate?: Yes; Fluid consistency:: Thin  Skin:  Wound (see comment) (closed incisions- abdomen and vagina )  Last BM:  02/11/2016  Height:   Ht Readings from Last 1 Encounters:  02/10/16 '5\' 5"'$  (1.651 m)    Weight:   Wt Readings from Last 1 Encounters:  02/10/16 145 lb (65.772 kg)    Ideal Body Weight:  56.8 kg  BMI:  Body mass index is 24.13 kg/(m^2).  Estimated Nutritional Needs:   Kcal:  1600-1900  Protein:  70-85 grams    Fluid:  1.6-1.9 L  EDUCATION NEEDS:   No education needs identified at this time  [place], Dietetic Intern Pager: [phone]

## 2016-02-12 NOTE — Consult Note (Signed)
WOC ostomy consult note Stoma type/location: LLQ end colostomy, superficially necrotic, top is sloughing away.  Purulent drainage is draining from peristomal site at 9:00, RUQ drain is in place with purulent drainage noted. Patient states stoma is functioning, scant amount liquid stool in pouch.  Has not eaten lunch and was emptied early this AM. RLQ colon condiut.  Intact.  Adapter placed at bedside to connect to bedside drainage bag this evening.   Bedside nurse is present to remove midline abdominal staples.  Shown the purulence around peristomal opening and informed regarding the adapter.   3 pouches for each left at bedside.  Stomal assessment/size: 1 3/8" oval, necrotic end colostomy 1" RLQ colon condiut, intact pink and moist Peristomal assessment: purulence draining from peristomal opening at 9:00.  Drain in place in LLQ Treatment options for stomal/peristomal skin: Convex pouch for colostomy.  Skin prep, per patient request Output Scant liquid stool colostomy Dark yellow urine in pouch colon conduit Ostomy pouching: 1pc.flat colon condiut Kellie Simmering #3) 1 piece convex colostomy Kellie Simmering # [phone]).  Education provided: Patient is aware of rationale for drain and necrotic status of colostomy Enrolled patient in [place] program: Yes [place] team will follow.   Domenic Moras RN BSN [place] Pager [phone]

## 2016-02-12 NOTE — Progress Notes (Signed)
  Pt without complaint. Ambulating well with walker. Off IVF. Drain fluid c/w urine (elevated Cr).   Filed Vitals:   02/11/16 2202 02/12/16 0647  BP: 139/41 126/51  Pulse: 84 81  Temp: 98.8 F (37.1 C) 98.5 F (36.9 C)  Resp: 18 18    Intake/Output Summary (Last 24 hours) at 02/12/16 1350 Last data filed at 02/12/16 1054  Gross per 24 hour  Intake   1980 ml  Output   1530 ml  Net    450 ml   NAD Abd - soft, NT Colon conduit pink and viable, stents in place (red is right) - some urine in the bag Incisions C/D/I Left perc drain in place  Assessment/plan: Ureterocolonic anastomotic leak - All urine output recorded after drain was placed is from drain and not from colon conduit. In discussing with the nurse and the patient this is incorrect. I went over anatomy with the nurse and she is familiar with it. She educated the techs and will educate the night nurse so that the ins and outs can be tracked accurately.  Urinoma culture pending.  Patient on Primaxin due to penicillin allergy.  Colostomy-nurse reports colostomy edge necrotic and cracked.  I asked her to discuss with general surgery.

## 2016-02-12 NOTE — Progress Notes (Signed)
Patient ID: [name], female   DOB: [birth year], 70 y.o.   MRN: [id number]    Referring Physician(s): [facility]  Daryll Brod  Chief Complaint:  Left retroperitoneal abscess/urinoma  Subjective:  Pt feeling better today; sore at LLQ drain site as expected  Allergies: Ciprofloxacin and Penicillins  Medications: Prior to Admission medications   Medication Sig Start Date End Date Taking? Authorizing Provider  Docusate Calcium (STOOL SOFTENER PO) Take 1 capsule by mouth daily as needed (constipation.).   Yes Historical Provider, MD  isoniazid (NYDRAZID) 300 MG tablet Take 300 mg by mouth daily.   Yes Historical Provider, MD  ketorolac (TORADOL) 10 MG tablet Take 1 tablet (10 mg total) by mouth every 8 (eight) hours as needed for moderate pain. Post-operatively 02/05/16  Yes Alexis Frock, MD  oxyCODONE-acetaminophen (PERCOCET/ROXICET) 5-325 MG tablet Take 1-2 tablets by mouth every 6 (six) hours as needed for severe pain. Post-operatively 02/05/16  Yes Alexis Frock, MD     Vital Signs: BP 126/51 mmHg  Pulse 81  Temp(Src) 98.5 F (36.9 C) (Oral)  Resp 18  Ht '5\' 5"'$  (1.651 m)  Wt 145 lb (65.772 kg)  BMI 24.13 kg/m2  SpO2 97%  Physical Exam awake/alert; left RP drain intact, insertion site ok, mildly tender, output 1.5 liters turbid, beige fluid, fluid creat elevated at 27.8 c/w urine; cx's pend  Imaging: Ct Abdomen Pelvis W Contrast  02/10/2016  CLINICAL DATA:  Intermittent abdominal pain. Left hip and groin pain. Leukocytosis. Necrosis at a colostomy stoma. Redness at a urostomy stoma. Ostomy stomal pain. Status post cystectomy, urostomy, hysterectomy, bilateral salpingo-oophorectomy, lymphadenectomy, lysis of adhesions, colostomy and sigmoid colectomy on 01/27/2016. Previous colon resection for colon cancer on 05/2012. History of bladder cancer. Previous chemotherapy completed. Additional chemotherapy and radiation therapy planned. EXAM: CT ABDOMEN AND PELVIS WITH CONTRAST  TECHNIQUE: Multidetector CT imaging of the abdomen and pelvis was performed using the standard protocol following bolus administration of intravenous contrast. CONTRAST:  112m OMNIPAQUE IOHEXOL 300 MG/ML  SOLN COMPARISON:  Abdomen radiograph dated 01/27/2016 and abdomen and pelvis CT dated 11/06/2015. FINDINGS: Lower chest: Interval mild right lower lobe atelectasis and minimal left lower lobe atelectasis. No pleural fluid. Hepatobiliary: Normal appearing liver and gallbladder. Pancreas: No mass, inflammatory changes, or other significant abnormality. Spleen: Within normal limits in size and appearance. Adrenals/Urinary Tract: Mild dilatation of both renal collecting systems. Bilateral of ureteral stent high are views are extending out the patient's urostomy, into the urostomy bag. Surgically absent urinary bladder. Normal appearing adrenal glands. Stomach/Bowel: No gastric or bowel dilatation. The distal transverse colon is exiting a left lower quadrant ostomy. The left colon and majority of the sigmoid colon is surgically absent. There is a surgical staple line at the inferior aspect of the cecum with the remainder of the sigmoid colon in contact with that portion of the cecum. These are possibly anastomosed. Vascular/Lymphatic: Atheromatous arterial calcifications. No enlarged lymph nodes. Reproductive: Surgically absent uterus and ovaries. Other: There is a large intraperitoneal and retroperitoneal fluid collection in the mid and lower abdomen and upper pelvis on the left. There is also multiple ocular saw of extraluminal air in that region and in the anterior abdomen. This fluid collection measures 14.7 x 10.5 cm on image number 58 of series 2 and 17.0 cm in length on coronal image number 42. No surrounding membrane more enhancement. There is also adjacent mesenteric soft tissue stranding/edema. Musculoskeletal: Lumbar and lower thoracic spine degenerative changes. IMPRESSION: 1. Large intraperitoneal and  retroperitoneal fluid  collection on the left, measuring 17.0 x 14.7 x 10.5 cm. This could represent a seroma or urinoma. 2. Small amount of residual postoperative air. Bowel perforation could also produce this appearance. 3. Mild bilateral hydronephrosis. 4. Mild bilateral lower lobe atelectasis. Electronically Signed   By: Claudie Revering M.D.   On: 02/10/2016 16:36   Ct Image Guided Drainage By Percutaneous Catheter  02/11/2016  INDICATION: 70 year old female status post extensive pelvic surgery with right lower quadrant diverting urinary conduit (colonic). She has a large left retroperitoneal fluid collection concerning for postoperative abscess versus urinoma. EXAM: CT GUIDED DRAINAGE OF LEFT RETROPERITONEAL ABSCESS MEDICATIONS: The patient is currently admitted to the hospital and receiving intravenous antibiotics. The antibiotics were administered within an appropriate time frame prior to the initiation of the procedure. ANESTHESIA/SEDATION: 1.5 mg IV Versed 50 mcg IV Fentanyl Moderate Sedation Time:  31 The patient was continuously monitored during the procedure by the interventional radiology nurse under my direct supervision. COMPLICATIONS: None immediate. TECHNIQUE: Informed written consent was obtained from the patient after a thorough discussion of the procedural risks, benefits and alternatives. All questions were addressed. Maximal Sterile Barrier Technique was utilized including caps, mask, sterile gowns, sterile gloves, sterile drape, hand hygiene and skin antiseptic. A timeout was performed prior to the initiation of the procedure. PROCEDURE: Informed written consent was obtained from the patient. A time-out procedure was performed. A planning CT scan was performed. The largest left lower retroperitoneal fluid collection is now filled with excreted contrast material. The excreted contrast can be seen tracking directly to the right ureterocolonic anastomosis. These findings are consistent with a  urinary leak. A suitable skin entry site was selected and marked. Local anesthesia was attained by infiltration with 1% lidocaine. A small dermatotomy was made. Using intermittent CT fluoroscopic guidance, an 18 gauge trocar needle was advanced to the left lower quadrant abdominal wall and into the complex fluid collection. An Amplatz wire was then advanced in a cephalad direction. The needle was removed and the tract dilated to 12 [place]. A Cook 49 [place] biliary drain with multiple sideholes was then advanced over the wire and formed. Aspiration yields approximately 900 mL foul-smelling frankly purulent fluid. A sample was sent for Gram stain and culture. Post drain placement imaging demonstrates near-total resolution of the fluid and contrast collection. The sideholes of the biliary drainage catheter span the expected location of the urine leak. The drain was fixed in place with 0 Prolene suture and an adhesive fixation device. A was connected to gravity bag drainage. Overall, the patient tolerated the procedure well. FINDINGS: Large left retroperitoneal urinoma secondary to leak at the right ureterocolonic anastomosis. IMPRESSION: 1. Imaging findings consistent with leak at the right ureterocolonic anastomosis with large left retroperitoneal urinoma. 2. Successful placement of a multi side-hole 14 French biliary drainage catheter with aspiration of 900 mL foul-smelling purulent fluid. A sample was sent for Gram stain and culture. Signed, Criselda Peaches, MD [facility] Electronically Signed   By: Jacqulynn Cadet M.D.   On: 02/11/2016 14:47    Labs:  CBC:  Recent Labs  01/26/16 1356  02/10/16 1342 02/10/16 1352 02/11/16 0431 02/12/16 0455  WBC 6.4  --  11.7*  --  12.0* 8.3  HGB 14.5  < > 9.7* 9.9* 8.4* 7.8*  HCT 44.8  < > 29.0* 29.0* 25.8* 24.4*  PLT 172  --  745*  --  709* 611*  < > = values in this interval not  displayed.  COAGS:  Recent Labs  02/11/16 0815  INR 1.25    BMP:  Recent Labs  02/01/16 0558 02/10/16 1342 02/10/16 1352 02/11/16 0431 02/11/16 2339  NA 137 135 134* 137 138  K 4.2 4.1 4.0 3.8 3.6  CL 102 99* 96* 105 107  CO2 28 25  --  22 25  GLUCOSE 124* 107* 112* 148* 165*  BUN '11 19 18 14 11  '$ CALCIUM 7.8* 8.4*  --  8.0* 7.9*  CREATININE 0.61 0.65 0.60 0.51 0.63  GFRNONAA >60 >60  --  >60 >60  GFRAA >60 >60  --  >60 >60    LIVER FUNCTION TESTS:  Recent Labs  01/26/16 1356 02/10/16 1342  BILITOT 1.0 0.9  AST 31 106*  ALT 29 154*  ALKPHOS 58 104  PROT 6.6 6.5  ALBUMIN 4.4 2.4*    Assessment and Plan: S/p drainage of large left RP urinoma/abscess 2/23; AF; WBC nl; hgb 7.8(8.4)- ?transfuse; check drain fluid cx's; fluid creat elevated; maintain drain and monitor output- once output diminished , check f/u CT; other plans as per [facility]CS/urology   Electronically Signed: D. Rowe Robert 02/12/2016, 1:09 PM   I spent a total of 15 minutes at the the patient's bedside AND on the patient's hospital floor or unit, greater than 50% of which was counseling/coordinating care for left retroperitoneal abscess/urinoma drain

## 2016-02-12 NOTE — Consult Note (Addendum)
WOC ostomy follow up: Colon Coduit (Urostomy) Stoma type/location: RUQ colon conduit.  Seen today immediately following Dr. Lyndal Rainbow visit Stomal assessment/size: 1 and 3/8 inches round with budding from 12-9 and skin level from 9-12 o'clock. Two stents intact-both are now gray, so this writer is unable to determine which is right or left. Peristomal assessment: intact, clear Treatment options for stomal/peristomal skin:  Output: 100 mls of dark brown material (urine with mucus, malodorous) Ostomy pouching: 1pc.convex with skin barrier ring Education provided: None Enrolled patient in [employer] Discharge program: Yes    WOC ostomy follow up: Colostomy Stoma type/location: LUQ colostomy:  Seen today with Dr. Excell Seltzer. Stomal assessment/size: 1 and 1/8 inch x 1 and 3/4 inch oval  Peristomal assessment: erythematous circumferentially measuring 1/4 inch, mucocutaneous separation from 1-3 o'clock.  We are able to express another 105ms of yellow pus from this area. Treatment options for stomal/peristomal skin: Skin barrier ring, convex pouching system Output: scant stool (pouch in place was recently placed. Ostomy pouching: 1pc.convex pouch with skin barrier ring Education provided: None [facility]  Supplies for both stomas are in room with patterns in the event that pouching system changes are due over the weekend.  [facility] will follow, will remain available to this patient, the nursing, surgical and medical teams.   Thanks, LMaudie Flakes MSN, RN, GRhea CArther Abbott Pager# [phone]

## 2016-02-12 NOTE — Progress Notes (Signed)
Patient ID: [name], female   DOB: 1946/02/28, 70 y.o.   MRN: [id number]     [address] [place], [place] [address]    Phone: [phone] FAX: [phone]     Subjective: 1418m output from drain, purulent. Feels significantly better today.  Afebrile x24h. Ostomy is functioning.   Objective:  Vital signs:  Filed Vitals:   02/11/16 1416 02/11/16 1451 02/11/16 2202 02/12/16 0647  BP: 160/57 161/45 139/41 126/51  Pulse: 87 92 84 81  Temp: 98.9 F (37.2 C) 98.9 F (37.2 C) 98.8 F (37.1 C) 98.5 F (36.9 C)  TempSrc: Oral Oral Oral Oral  Resp: 20 20 18 18   Height:      Weight:      SpO2: 98% 97% 97% 97%    Last BM Date: 02/11/16  Intake/Output   Yesterday:  02/23 0701 - 02/24 0700 In: 1980 [I.V.:1875; IV Piggyback:100] Out: 2605 [Urine:900; Drains:1475; Stool:230] This shift:     Physical Exam: General: Pt awake/alert/oriented x4 in no acute distress  Abdomen: Soft.  Nondistended. LUQ urostomy functioning.  RLQ stoma is superficially necrotic, functioning.  llq drain.  No evidence of peritonitis.  No incarcerated hernias.    Problem List:   Active Problems:   Urinoma    Results:   Labs: Results for orders placed or performed during the hospital encounter of 02/10/16 (from the past 48 hour(s))  Blood culture (routine x 2)     Status: None (Preliminary result)   Collection Time: 02/10/16  1:30 PM  Result Value Ref Range   Specimen Description BLOOD RIGHT ANTECUBITAL    Special Requests BOTTLES DRAWN AEROBIC AND ANAEROBIC 5 CC EA    Culture  Setup Time      GRAM POSITIVE COCCI IN CLUSTERS AEROBIC BOTTLE ONLY CRITICAL RESULT CALLED TO, READ BACK BY AND VERIFIED WITH: B MAY RN 1930 02/11/16 A BROWNING    Culture      NO GROWTH < 24 HOURS [facility]   Report Status PENDING   Blood culture (routine x 2)     Status: None (Preliminary result)   Collection  Time: 02/10/16  1:40 PM  Result Value Ref Range   Specimen Description BLOOD LEFT ANTECUBITAL    Special Requests BOTTLES DRAWN AEROBIC AND ANAEROBIC 5 CC EA    Culture      NO GROWTH < 24 HOURS [facility]   Report Status PENDING   CBC with Differential     Status: Abnormal   Collection Time: 02/10/16  1:42 PM  Result Value Ref Range   WBC 11.7 (H) 4.0 - 10.5 K/uL   RBC 3.28 (L) 3.87 - 5.11 MIL/uL   Hemoglobin 9.7 (L) 12.0 - 15.0 g/dL   HCT 29.0 (L) 36.0 - 46.0 %   MCV 88.4 78.0 - 100.0 fL   MCH 29.6 26.0 - 34.0 pg   MCHC 33.4 30.0 - 36.0 g/dL   RDW 13.9 11.5 - 15.5 %   Platelets 745 (H) 150 - 400 K/uL   Neutrophils Relative % 78 %   Lymphocytes Relative 10 %   Monocytes Relative 12 %   Eosinophils Relative 0 %   Basophils Relative 0 %   Neutro Abs 9.1 (H) 1.7 - 7.7 K/uL   Lymphs Abs 1.2 0.7 - 4.0 K/uL   Monocytes Absolute 1.4 (H) 0.1 - 1.0 K/uL   Eosinophils  Absolute 0.0 0.0 - 0.7 K/uL   Basophils Absolute 0.0 0.0 - 0.1 K/uL   Smear Review MORPHOLOGY UNREMARKABLE   Comprehensive metabolic panel     Status: Abnormal   Collection Time: 02/10/16  1:42 PM  Result Value Ref Range   Sodium 135 135 - 145 mmol/L   Potassium 4.1 3.5 - 5.1 mmol/L   Chloride 99 (L) 101 - 111 mmol/L   CO2 25 22 - 32 mmol/L   Glucose, Bld 107 (H) 65 - 99 mg/dL   BUN 19 6 - 20 mg/dL   Creatinine, Ser 0.65 0.44 - 1.00 mg/dL   Calcium 8.4 (L) 8.9 - 10.3 mg/dL   Total Protein 6.5 6.5 - 8.1 g/dL   Albumin 2.4 (L) 3.5 - 5.0 g/dL   AST 106 (H) 15 - 41 U/L   ALT 154 (H) 14 - 54 U/L   Alkaline Phosphatase 104 38 - 126 U/L   Total Bilirubin 0.9 0.3 - 1.2 mg/dL   GFR calc non Af Amer >60 >60 mL/min   GFR calc Af Amer >60 >60 mL/min    Comment: (NOTE) The eGFR has been calculated using the CKD EPI equation. This calculation has not been validated in all clinical situations. eGFR's persistently <60 mL/min signify possible Chronic Kidney Disease.    Anion gap 11 5 - 15  I-Stat  Chem 8, ED     Status: Abnormal   Collection Time: 02/10/16  1:52 PM  Result Value Ref Range   Sodium 134 (L) 135 - 145 mmol/L   Potassium 4.0 3.5 - 5.1 mmol/L   Chloride 96 (L) 101 - 111 mmol/L   BUN 18 6 - 20 mg/dL   Creatinine, Ser 0.60 0.44 - 1.00 mg/dL   Glucose, Bld 112 (H) 65 - 99 mg/dL   Calcium, Ion 1.05 (L) 1.13 - 1.30 mmol/L   TCO2 25 0 - 100 mmol/L   Hemoglobin 9.9 (L) 12.0 - 15.0 g/dL   HCT 29.0 (L) 36.0 - 46.0 %  I-Stat CG4 Lactic Acid, ED     Status: None   Collection Time: 02/10/16  1:53 PM  Result Value Ref Range   Lactic Acid, Venous 1.44 0.5 - 2.0 mmol/L  MRSA PCR Screening     Status: Abnormal   Collection Time: 02/10/16  8:18 PM  Result Value Ref Range   MRSA by PCR POSITIVE (A) NEGATIVE    Comment:        The GeneXpert MRSA Assay (FDA approved for NASAL specimens only), is one component of a comprehensive MRSA colonization surveillance program. It is not intended to diagnose MRSA infection nor to guide or monitor treatment for MRSA infections. RESULT CALLED TO, READ BACK BY AND VERIFIED WITH: P.PELOTTE,RN AT 2308 ON 02/10/16 BY W.SHEA   Basic metabolic panel     Status: Abnormal   Collection Time: 02/11/16  4:31 AM  Result Value Ref Range   Sodium 137 135 - 145 mmol/L   Potassium 3.8 3.5 - 5.1 mmol/L   Chloride 105 101 - 111 mmol/L   CO2 22 22 - 32 mmol/L   Glucose, Bld 148 (H) 65 - 99 mg/dL   BUN 14 6 - 20 mg/dL   Creatinine, Ser 0.51 0.44 - 1.00 mg/dL   Calcium 8.0 (L) 8.9 - 10.3 mg/dL   GFR calc non Af Amer >60 >60 mL/min   GFR calc Af Amer >60 >60 mL/min    Comment: (NOTE) The eGFR has been calculated using the CKD EPI equation.  This calculation has not been validated in all clinical situations. eGFR's persistently <60 mL/min signify possible Chronic Kidney Disease.    Anion gap 10 5 - 15  CBC     Status: Abnormal   Collection Time: 02/11/16  4:31 AM  Result Value Ref Range   WBC 12.0 (H) 4.0 - 10.5 K/uL   RBC 2.88 (L) 3.87 - 5.11  MIL/uL   Hemoglobin 8.4 (L) 12.0 - 15.0 g/dL   HCT 25.8 (L) 36.0 - 46.0 %   MCV 89.6 78.0 - 100.0 fL   MCH 29.2 26.0 - 34.0 pg   MCHC 32.6 30.0 - 36.0 g/dL   RDW 14.2 11.5 - 15.5 %   Platelets 709 (H) 150 - 400 K/uL  Protime-INR     Status: Abnormal   Collection Time: 02/11/16  8:15 AM  Result Value Ref Range   Prothrombin Time 15.8 (H) 11.6 - 15.2 seconds   INR 1.25 0.00 - 1.49  Body fluid culture     Status: None (Preliminary result)   Collection Time: 02/11/16  1:27 PM  Result Value Ref Range   Specimen Description PERITONEAL CAVITY    Special Requests Normal    Gram Stain      ABUNDANT WBC PRESENT,BOTH PMN AND MONONUCLEAR ABUNDANT GRAM NEGATIVE RODS ABUNDANT GRAM POSITIVE COCCI IN CHAINS IN PAIRS Gram Stain Report Called to,Read Back By and Verified With: Renold Don RN [phone] 02/11/16 A BROWNING Performed at [facility]    Culture PENDING    Report Status PENDING   Creatinine, body fluid     Status: None   Collection Time: 02/11/16  4:18 PM  Result Value Ref Range   Creat, Fluid 27.8 mg/dL    Comment: RESULTS CONFIRMED BY MANUAL DILUTION (NOTE) No normal range established for this test Results should be evaluated in conjunction with serum values Performed at [facility]    Fluid Type-FCRE JP DRAINAGE   Basic metabolic panel     Status: Abnormal   Collection Time: 02/11/16 11:39 PM  Result Value Ref Range   Sodium 138 135 - 145 mmol/L   Potassium 3.6 3.5 - 5.1 mmol/L   Chloride 107 101 - 111 mmol/L   CO2 25 22 - 32 mmol/L   Glucose, Bld 165 (H) 65 - 99 mg/dL   BUN 11 6 - 20 mg/dL   Creatinine, Ser 0.63 0.44 - 1.00 mg/dL   Calcium 7.9 (L) 8.9 - 10.3 mg/dL   GFR calc non Af Amer >60 >60 mL/min   GFR calc Af Amer >60 >60 mL/min    Comment: (NOTE) The eGFR has been calculated using the CKD EPI equation. This calculation has not been validated in all clinical situations. eGFR's persistently <60 mL/min signify possible Chronic Kidney Disease.    Anion  gap 6 5 - 15  CBC     Status: Abnormal   Collection Time: 02/12/16  4:55 AM  Result Value Ref Range   WBC 8.3 4.0 - 10.5 K/uL   RBC 2.71 (L) 3.87 - 5.11 MIL/uL   Hemoglobin 7.8 (L) 12.0 - 15.0 g/dL   HCT 24.4 (L) 36.0 - 46.0 %   MCV 90.0 78.0 - 100.0 fL   MCH 28.8 26.0 - 34.0 pg   MCHC 32.0 30.0 - 36.0 g/dL   RDW 14.5 11.5 - 15.5 %   Platelets 611 (H) 150 - 400 K/uL    Imaging / Studies: Ct Abdomen Pelvis W Contrast  02/10/2016  CLINICAL DATA:  Intermittent  abdominal pain. Left hip and groin pain. Leukocytosis. Necrosis at a colostomy stoma. Redness at a urostomy stoma. Ostomy stomal pain. Status post cystectomy, urostomy, hysterectomy, bilateral salpingo-oophorectomy, lymphadenectomy, lysis of adhesions, colostomy and sigmoid colectomy on 01/27/2016. Previous colon resection for colon cancer on 05/2012. History of bladder cancer. Previous chemotherapy completed. Additional chemotherapy and radiation therapy planned. EXAM: CT ABDOMEN AND PELVIS WITH CONTRAST TECHNIQUE: Multidetector CT imaging of the abdomen and pelvis was performed using the standard protocol following bolus administration of intravenous contrast. CONTRAST:  163m OMNIPAQUE IOHEXOL 300 MG/ML  SOLN COMPARISON:  Abdomen radiograph dated 01/27/2016 and abdomen and pelvis CT dated 11/06/2015. FINDINGS: Lower chest: Interval mild right lower lobe atelectasis and minimal left lower lobe atelectasis. No pleural fluid. Hepatobiliary: Normal appearing liver and gallbladder. Pancreas: No mass, inflammatory changes, or other significant abnormality. Spleen: Within normal limits in size and appearance. Adrenals/Urinary Tract: Mild dilatation of both renal collecting systems. Bilateral of ureteral stent high are views are extending out the patient's urostomy, into the urostomy bag. Surgically absent urinary bladder. Normal appearing adrenal glands. Stomach/Bowel: No gastric or bowel dilatation. The distal transverse colon is exiting a left  lower quadrant ostomy. The left colon and majority of the sigmoid colon is surgically absent. There is a surgical staple line at the inferior aspect of the cecum with the remainder of the sigmoid colon in contact with that portion of the cecum. These are possibly anastomosed. Vascular/Lymphatic: Atheromatous arterial calcifications. No enlarged lymph nodes. Reproductive: Surgically absent uterus and ovaries. Other: There is a large intraperitoneal and retroperitoneal fluid collection in the mid and lower abdomen and upper pelvis on the left. There is also multiple ocular saw of extraluminal air in that region and in the anterior abdomen. This fluid collection measures 14.7 x 10.5 cm on image number 58 of series 2 and 17.0 cm in length on coronal image number 42. No surrounding membrane more enhancement. There is also adjacent mesenteric soft tissue stranding/edema. Musculoskeletal: Lumbar and lower thoracic spine degenerative changes. IMPRESSION: 1. Large intraperitoneal and retroperitoneal fluid collection on the left, measuring 17.0 x 14.7 x 10.5 cm. This could represent a seroma or urinoma. 2. Small amount of residual postoperative air. Bowel perforation could also produce this appearance. 3. Mild bilateral hydronephrosis. 4. Mild bilateral lower lobe atelectasis. Electronically Signed   By: SClaudie ReveringM.D.   On: 02/10/2016 16:36   Ct Image Guided Drainage By Percutaneous Catheter  02/11/2016  INDICATION: 70year old female status post extensive pelvic surgery with right lower quadrant diverting urinary conduit (colonic). She has a large left retroperitoneal fluid collection concerning for postoperative abscess versus urinoma. EXAM: CT GUIDED DRAINAGE OF LEFT RETROPERITONEAL ABSCESS MEDICATIONS: The patient is currently admitted to the hospital and receiving intravenous antibiotics. The antibiotics were administered within an appropriate time frame prior to the initiation of the procedure.  ANESTHESIA/SEDATION: 1.5 mg IV Versed 50 mcg IV Fentanyl Moderate Sedation Time:  31 The patient was continuously monitored during the procedure by the interventional radiology nurse under my direct supervision. COMPLICATIONS: None immediate. TECHNIQUE: Informed written consent was obtained from the patient after a thorough discussion of the procedural risks, benefits and alternatives. All questions were addressed. Maximal Sterile Barrier Technique was utilized including caps, mask, sterile gowns, sterile gloves, sterile drape, hand hygiene and skin antiseptic. A timeout was performed prior to the initiation of the procedure. PROCEDURE: Informed written consent was obtained from the patient. A time-out procedure was performed. A planning CT scan was performed. The largest left  lower retroperitoneal fluid collection is now filled with excreted contrast material. The excreted contrast can be seen tracking directly to the right ureterocolonic anastomosis. These findings are consistent with a urinary leak. A suitable skin entry site was selected and marked. Local anesthesia was attained by infiltration with 1% lidocaine. A small dermatotomy was made. Using intermittent CT fluoroscopic guidance, an 18 gauge trocar needle was advanced to the left lower quadrant abdominal wall and into the complex fluid collection. An Amplatz wire was then advanced in a cephalad direction. The needle was removed and the tract dilated to 12 [place]. A Cook 58 [place] biliary drain with multiple sideholes was then advanced over the wire and formed. Aspiration yields approximately 900 mL foul-smelling frankly purulent fluid. A sample was sent for Gram stain and culture. Post drain placement imaging demonstrates near-total resolution of the fluid and contrast collection. The sideholes of the biliary drainage catheter span the expected location of the urine leak. The drain was fixed in place with 0 Prolene suture and an adhesive fixation device.  A was connected to gravity bag drainage. Overall, the patient tolerated the procedure well. FINDINGS: Large left retroperitoneal urinoma secondary to leak at the right ureterocolonic anastomosis. IMPRESSION: 1. Imaging findings consistent with leak at the right ureterocolonic anastomosis with large left retroperitoneal urinoma. 2. Successful placement of a multi side-hole 14 French biliary drainage catheter with aspiration of 900 mL foul-smelling purulent fluid. A sample was sent for Gram stain and culture. Signed, Criselda Peaches, MD [facility] Radiology Electronically Signed   By: Jacqulynn Cadet M.D.   On: 02/11/2016 14:47    Medications / Allergies:  Scheduled Meds: . Chlorhexidine Gluconate Cloth  6 each Topical Q0600  . enoxaparin (LOVENOX) injection  40 mg Subcutaneous Q24H  . imipenem-cilastatin  500 mg Intravenous Q8H  . mupirocin ointment  1 application Nasal BID   Continuous Infusions: . dextrose 5 % and 0.9% NaCl 75 mL/hr at 02/12/16 1112   PRN Meds:.acetaminophen **OR** acetaminophen, HYDROmorphone (DILAUDID) injection, ondansetron **OR** ondansetron (ZOFRAN) IV  Antibiotics: Anti-infectives    Start     Dose/Rate Route Frequency Ordered Stop   02/11/16 0900  imipenem-cilastatin (PRIMAXIN) 500 mg in sodium chloride 0.9 % 100 mL IVPB     500 mg 200 mL/hr over 30 Minutes Intravenous Every 8 hours 02/11/16 8676          Assessment/Plan s/p robotic converted to open radical cystectomy with pelvic exenteration, hysterectomy, bil salingo-oophorectomy, total urethrectomy, pelvic lymphadenectomy, colon conduit urinary diversion by Dr. Tresa Moore and a lysis of adhesions, sigmoid colectomy with resection of prior anastomosis, end colostomy--Dr. Dalbert Batman and Gross 01/27/16  -DC staples and apply steri strips -mobilize Superficially necrotic stoma-it is viable and now functioning, will continue to monitor closely.  anastomotic leak-s/p  IR perc drain, 1480m out, purulent ID-primaxin D#1 Cx GNR, GPC FEN-no issues, DC IVF, add PO pain meds VTE prophylaxis-SCD/lovenox  Appreciate urology taking onto their surgery.  [facility] will continue to follow along.   EErby Pian [facility] Pager [phone])   02/12/2016 12:01 PM

## 2016-02-13 DIAGNOSIS — N2889 Other specified disorders of kidney and ureter: Secondary | ICD-10-CM | POA: Insufficient documentation

## 2016-02-13 MED ORDER — CHLORHEXIDINE GLUCONATE CLOTH 2 % EX PADS
6.0000 | MEDICATED_PAD | Freq: Every day | CUTANEOUS | Status: AC
  Administered 2016-02-13 – 2016-02-14 (×2): 6 via TOPICAL

## 2016-02-13 MED ORDER — HYDROCODONE-ACETAMINOPHEN 5-325 MG PO TABS
1.0000 | ORAL_TABLET | Freq: Four times a day (QID) | ORAL | Status: DC | PRN
  Administered 2016-02-16 (×2): 1 via ORAL
  Filled 2016-02-13 (×3): qty 1

## 2016-02-13 NOTE — Progress Notes (Signed)
Patient ID: MCKINZEY ENTWISTLE, female   DOB: 1946/12/16, 70 y.o.   MRN: [id number]    Subjective: Feels about the same.Some pain around percutaneous drain site. She feels much better than at admission.  Objective: Vital signs in last 24 hours: Temp:  [98.1 F (36.7 C)-99.6 F (37.6 C)] 98.6 F (37 C) (02/25 0644) Pulse Rate:  [72-77] 77 (02/25 0644) Resp:  [18] 18 (02/25 0644) BP: (132-139)/(49-60) 138/49 mmHg (02/25 0644) SpO2:  [97 %-99 %] 97 % (02/25 0644) Last BM Date: 02/11/16  Intake/Output from previous day: 02/24 0701 - 02/25 0700 In: 600 [P.O.:600] Out: 2100 [Drains:2100] Intake/Output this shift:    General appearance: alert, cooperative and no distress GI: mild left lower quadrant tenderness much improved from admission. Colostomy functioning. Superficial slough on colostomy. Incision/Wound: no erythema or drainage  Lab Results:   Recent Labs  02/11/16 0431 02/12/16 0455  WBC 12.0* 8.3  HGB 8.4* 7.8*  HCT 25.8* 24.4*  PLT 709* 611*   BMET  Recent Labs  02/11/16 0431 02/11/16 2339  NA 137 138  K 3.8 3.6  CL 105 107  CO2 22 25  GLUCOSE 148* 165*  BUN 14 11  CREATININE 0.51 0.63  CALCIUM 8.0* 7.9*     Studies/Results: Ct Image Guided Drainage By Percutaneous Catheter  02/11/2016  INDICATION: 70 year old female status post extensive pelvic surgery with right lower quadrant diverting urinary conduit (colonic). She has a large left retroperitoneal fluid collection concerning for postoperative abscess versus urinoma. EXAM: CT GUIDED DRAINAGE OF LEFT RETROPERITONEAL ABSCESS MEDICATIONS: The patient is currently admitted to the hospital and receiving intravenous antibiotics. The antibiotics were administered within an appropriate time frame prior to the initiation of the procedure. ANESTHESIA/SEDATION: 1.5 mg IV Versed 50 mcg IV Fentanyl Moderate Sedation Time:  31 The patient was continuously monitored during the procedure by the interventional radiology  nurse under my direct supervision. COMPLICATIONS: None immediate. TECHNIQUE: Informed written consent was obtained from the patient after a thorough discussion of the procedural risks, benefits and alternatives. All questions were addressed. Maximal Sterile Barrier Technique was utilized including caps, mask, sterile gowns, sterile gloves, sterile drape, hand hygiene and skin antiseptic. A timeout was performed prior to the initiation of the procedure. PROCEDURE: Informed written consent was obtained from the patient. A time-out procedure was performed. A planning CT scan was performed. The largest left lower retroperitoneal fluid collection is now filled with excreted contrast material. The excreted contrast can be seen tracking directly to the right ureterocolonic anastomosis. These findings are consistent with a urinary leak. A suitable skin entry site was selected and marked. Local anesthesia was attained by infiltration with 1% lidocaine. A small dermatotomy was made. Using intermittent CT fluoroscopic guidance, an 18 gauge trocar needle was advanced to the left lower quadrant abdominal wall and into the complex fluid collection. An Amplatz wire was then advanced in a cephalad direction. The needle was removed and the tract dilated to 12 [place]. A Cook 77 [place] biliary drain with multiple sideholes was then advanced over the wire and formed. Aspiration yields approximately 900 mL foul-smelling frankly purulent fluid. A sample was sent for Gram stain and culture. Post drain placement imaging demonstrates near-total resolution of the fluid and contrast collection. The sideholes of the biliary drainage catheter span the expected location of the urine leak. The drain was fixed in place with 0 Prolene suture and an adhesive fixation device. A was connected to gravity bag drainage. Overall, the patient tolerated the procedure well.  FINDINGS: Large left retroperitoneal urinoma secondary to leak at the right  ureterocolonic anastomosis. IMPRESSION: 1. Imaging findings consistent with leak at the right ureterocolonic anastomosis with large left retroperitoneal urinoma. 2. Successful placement of a multi side-hole 14 French biliary drainage catheter with aspiration of 900 mL foul-smelling purulent fluid. A sample was sent for Gram stain and culture. Signed, Criselda Peaches, MD [facility] Radiology Electronically Signed   By: Jacqulynn Cadet M.D.   On: 02/11/2016 14:47    Anti-infectives: Anti-infectives    Start     Dose/Rate Route Frequency Ordered Stop   02/11/16 0900  imipenem-cilastatin (PRIMAXIN) 500 mg in sodium chloride 0.9 % 100 mL IVPB     500 mg 200 mL/hr over 30 Minutes Intravenous Every 8 hours 02/11/16 0149        Assessment/Plan: s/p robotic converted to open radical cystectomy with pelvic exenteration, hysterectomy, bil salingo-oophorectomy, total urethrectomy, pelvic lymphadenectomy, colon conduit urinary diversion by Dr. Tresa Moore and a lysis of adhesions, sigmoid colectomy with resection of prior anastomosis, end colostomy--Dr. Dalbert Batman and Gross 01/27/16   Superficially necrotic stoma-it is viable and now functioning, will continue to monitor closely. We will check again Monday anastomotic leak-s/p IR perc drain, clear urine now draining ID-primaxin D#1 Cx GNR, GPC FEN-no issues, DC IVF, add PO pain meds VTE prophylaxis-SCD/lovenox     LOS: 3 days    Vibha Ferdig T 02/13/2016

## 2016-02-13 NOTE — Progress Notes (Signed)
Patient ID: [name], female   DOB: [birth year], 70 y.o.   MRN: [id number]    Subjective: Pt s/p radical cystectomy and colon conduit by Dr. Tresa Moore with colon resection and colostomy per General Surgery.  Readmitted with urinoma and apparent anastomotic leak at least right ureteral anastomosis although no UOP through urostomy has suggested all urine is leaking.  Percutaneous drain placed to drain urinoma on 2/23.  Pt currently feeling ok aside from malaise and weakness.  She is eating fairly well.  She denies abdominal pain, nausea, or vomiting.  Passing flatus per colostomy.  Objective: Vital signs in last 24 hours: Temp:  [98.1 F (36.7 C)-99.6 F (37.6 C)] 98.6 F (37 C) (02/25 0644) Pulse Rate:  [72-77] 77 (02/25 0644) Resp:  [18] 18 (02/25 0644) BP: (132-139)/(49-60) 138/49 mmHg (02/25 0644) SpO2:  [97 %-99 %] 97 % (02/25 0644)  Intake/Output from previous day: 02/24 0701 - 02/25 0700 In: 600 [P.O.:600] Out: 2100 [Drains:2100] Intake/Output this shift:    Physical Exam:  General: Alert and oriented CV: RRR Lungs: Clear Abdomen: Soft, ND, NT, Urostomy pink and patent with minimal urine in bag, Colostomy pink with flatus in bag, LLQ abdominal drain with uriniferous fluid Incisions: C/D/I Ext: NT, No erythema  Lab Results:  Recent Labs  02/10/16 1352 02/11/16 0431 02/12/16 0455  HGB 9.9* 8.4* 7.8*  HCT 29.0* 25.8* 24.4*   CBC Latest Ref Rng 02/12/2016 02/11/2016 02/10/2016  WBC 4.0 - 10.5 K/uL 8.3 12.0(H) -  Hemoglobin 12.0 - 15.0 g/dL 7.8(L) 8.4(L) 9.9(L)  Hematocrit 36.0 - 46.0 % 24.4(L) 25.8(L) 29.0(L)  Platelets 150 - 400 K/uL 611(H) 709(H) -      BMET  Recent Labs  02/11/16 0431 02/11/16 2339  NA 137 138  K 3.8 3.6  CL 105 107  CO2 22 25  GLUCOSE 148* 165*  BUN 14 11  CREATININE 0.51 0.63  CALCIUM 8.0* 7.9*   Culture from urinoma pending.  Studies/Results: Ct Image Guided Drainage By Percutaneous Catheter  02/11/2016  INDICATION:  70 year old female status post extensive pelvic surgery with right lower quadrant diverting urinary conduit (colonic). She has a large left retroperitoneal fluid collection concerning for postoperative abscess versus urinoma. EXAM: CT GUIDED DRAINAGE OF LEFT RETROPERITONEAL ABSCESS MEDICATIONS: The patient is currently admitted to the hospital and receiving intravenous antibiotics. The antibiotics were administered within an appropriate time frame prior to the initiation of the procedure. ANESTHESIA/SEDATION: 1.5 mg IV Versed 50 mcg IV Fentanyl Moderate Sedation Time:  31 The patient was continuously monitored during the procedure by the interventional radiology nurse under my direct supervision. COMPLICATIONS: None immediate. TECHNIQUE: Informed written consent was obtained from the patient after a thorough discussion of the procedural risks, benefits and alternatives. All questions were addressed. Maximal Sterile Barrier Technique was utilized including caps, mask, sterile gowns, sterile gloves, sterile drape, hand hygiene and skin antiseptic. A timeout was performed prior to the initiation of the procedure. PROCEDURE: Informed written consent was obtained from the patient. A time-out procedure was performed. A planning CT scan was performed. The largest left lower retroperitoneal fluid collection is now filled with excreted contrast material. The excreted contrast can be seen tracking directly to the right ureterocolonic anastomosis. These findings are consistent with a urinary leak. A suitable skin entry site was selected and marked. Local anesthesia was attained by infiltration with 1% lidocaine. A small dermatotomy was made. Using intermittent CT fluoroscopic guidance, an 18 gauge trocar needle was advanced to the left lower quadrant abdominal  wall and into the complex fluid collection. An Amplatz wire was then advanced in a cephalad direction. The needle was removed and the tract dilated to 12 [place]. A Cook  65 [place] biliary drain with multiple sideholes was then advanced over the wire and formed. Aspiration yields approximately 900 mL foul-smelling frankly purulent fluid. A sample was sent for Gram stain and culture. Post drain placement imaging demonstrates near-total resolution of the fluid and contrast collection. The sideholes of the biliary drainage catheter span the expected location of the urine leak. The drain was fixed in place with 0 Prolene suture and an adhesive fixation device. A was connected to gravity bag drainage. Overall, the patient tolerated the procedure well. FINDINGS: Large left retroperitoneal urinoma secondary to leak at the right ureterocolonic anastomosis. IMPRESSION: 1. Imaging findings consistent with leak at the right ureterocolonic anastomosis with large left retroperitoneal urinoma. 2. Successful placement of a multi side-hole 14 French biliary drainage catheter with aspiration of 900 mL foul-smelling purulent fluid. A sample was sent for Gram stain and culture. Signed, Criselda Peaches, MD [facility] Electronically Signed   By: Jacqulynn Cadet M.D.   On: 02/11/2016 14:47    Assessment/Plan: S/P cystectomy and colon conduit along with colectomy and colostomy with urine leak: - Continue abdominal drain which has temporized her situation although all UOP appears to be coming out percutaneous abdominal drain.  She likely will require more proximal definitive urine diversion with percutaneous nephrostomy tubes and I will speak with IR about this.  Not an urgent matter today but likely will need to be done in next couple of days if no improvement in outputs. - Continue broad spectrum antibiotic pending cultures from drain - Will order PT to help patient from becoming deconditioned.  She may require placement at rehab following hospitalization.   LOS: 3 days   Dalia Jollie,LES 02/13/2016, 7:19 AM

## 2016-02-13 NOTE — Progress Notes (Signed)
Patient ID: [name], female   DOB: [birth year], 70 y.o.   MRN: [id number]    Referring Physician(s): [facility]/Borden  Corrie Mckusick  Chief Complaint: Left retroperitoneal abscess/urinoma   Subjective:  Pt still weak; frustrated over current situation; some soreness at LLQ drain site; denies N/V  Allergies: Ciprofloxacin and Penicillins  Medications: Prior to Admission medications   Medication Sig Start Date End Date Taking? Authorizing Provider  Docusate Calcium (STOOL SOFTENER PO) Take 1 capsule by mouth daily as needed (constipation.).   Yes Historical Provider, MD  isoniazid (NYDRAZID) 300 MG tablet Take 300 mg by mouth daily.   Yes Historical Provider, MD  ketorolac (TORADOL) 10 MG tablet Take 1 tablet (10 mg total) by mouth every 8 (eight) hours as needed for moderate pain. Post-operatively 02/05/16  Yes Alexis Frock, MD  oxyCODONE-acetaminophen (PERCOCET/ROXICET) 5-325 MG tablet Take 1-2 tablets by mouth every 6 (six) hours as needed for severe pain. Post-operatively 02/05/16  Yes Alexis Frock, MD     Vital Signs: BP 138/49 mmHg  Pulse 77  Temp(Src) 98.6 F (37 C) (Oral)  Resp 18  Ht '5\' 5"'$  (1.651 m)  Wt 145 lb (65.772 kg)  BMI 24.13 kg/m2  SpO2 97%  Physical Exam LLQ drain intact, insertion site ok, mild-mod tender; output over 2 liters turbid , beige fluid; chest- sl dim BS bases; heart- RRR; ext - no sig edema; RLQ ostomy intact, abd soft, few BS  Imaging: Ct Abdomen Pelvis W Contrast  02/10/2016  CLINICAL DATA:  Intermittent abdominal pain. Left hip and groin pain. Leukocytosis. Necrosis at a colostomy stoma. Redness at a urostomy stoma. Ostomy stomal pain. Status post cystectomy, urostomy, hysterectomy, bilateral salpingo-oophorectomy, lymphadenectomy, lysis of adhesions, colostomy and sigmoid colectomy on 01/27/2016. Previous colon resection for colon cancer on 05/2012. History of bladder cancer. Previous chemotherapy completed. Additional chemotherapy and  radiation therapy planned. EXAM: CT ABDOMEN AND PELVIS WITH CONTRAST TECHNIQUE: Multidetector CT imaging of the abdomen and pelvis was performed using the standard protocol following bolus administration of intravenous contrast. CONTRAST:  153m OMNIPAQUE IOHEXOL 300 MG/ML  SOLN COMPARISON:  Abdomen radiograph dated 01/27/2016 and abdomen and pelvis CT dated 11/06/2015. FINDINGS: Lower chest: Interval mild right lower lobe atelectasis and minimal left lower lobe atelectasis. No pleural fluid. Hepatobiliary: Normal appearing liver and gallbladder. Pancreas: No mass, inflammatory changes, or other significant abnormality. Spleen: Within normal limits in size and appearance. Adrenals/Urinary Tract: Mild dilatation of both renal collecting systems. Bilateral of ureteral stent high are views are extending out the patient's urostomy, into the urostomy bag. Surgically absent urinary bladder. Normal appearing adrenal glands. Stomach/Bowel: No gastric or bowel dilatation. The distal transverse colon is exiting a left lower quadrant ostomy. The left colon and majority of the sigmoid colon is surgically absent. There is a surgical staple line at the inferior aspect of the cecum with the remainder of the sigmoid colon in contact with that portion of the cecum. These are possibly anastomosed. Vascular/Lymphatic: Atheromatous arterial calcifications. No enlarged lymph nodes. Reproductive: Surgically absent uterus and ovaries. Other: There is a large intraperitoneal and retroperitoneal fluid collection in the mid and lower abdomen and upper pelvis on the left. There is also multiple ocular saw of extraluminal air in that region and in the anterior abdomen. This fluid collection measures 14.7 x 10.5 cm on image number 58 of series 2 and 17.0 cm in length on coronal image number 42. No surrounding membrane more enhancement. There is also adjacent mesenteric soft tissue stranding/edema. Musculoskeletal: Lumbar  and lower thoracic  spine degenerative changes. IMPRESSION: 1. Large intraperitoneal and retroperitoneal fluid collection on the left, measuring 17.0 x 14.7 x 10.5 cm. This could represent a seroma or urinoma. 2. Small amount of residual postoperative air. Bowel perforation could also produce this appearance. 3. Mild bilateral hydronephrosis. 4. Mild bilateral lower lobe atelectasis. Electronically Signed   By: Claudie Revering M.D.   On: 02/10/2016 16:36   Ct Image Guided Drainage By Percutaneous Catheter  02/11/2016  INDICATION: 70 year old female status post extensive pelvic surgery with right lower quadrant diverting urinary conduit (colonic). She has a large left retroperitoneal fluid collection concerning for postoperative abscess versus urinoma. EXAM: CT GUIDED DRAINAGE OF LEFT RETROPERITONEAL ABSCESS MEDICATIONS: The patient is currently admitted to the hospital and receiving intravenous antibiotics. The antibiotics were administered within an appropriate time frame prior to the initiation of the procedure. ANESTHESIA/SEDATION: 1.5 mg IV Versed 50 mcg IV Fentanyl Moderate Sedation Time:  31 The patient was continuously monitored during the procedure by the interventional radiology nurse under my direct supervision. COMPLICATIONS: None immediate. TECHNIQUE: Informed written consent was obtained from the patient after a thorough discussion of the procedural risks, benefits and alternatives. All questions were addressed. Maximal Sterile Barrier Technique was utilized including caps, mask, sterile gowns, sterile gloves, sterile drape, hand hygiene and skin antiseptic. A timeout was performed prior to the initiation of the procedure. PROCEDURE: Informed written consent was obtained from the patient. A time-out procedure was performed. A planning CT scan was performed. The largest left lower retroperitoneal fluid collection is now filled with excreted contrast material. The excreted contrast can be seen tracking directly to the right  ureterocolonic anastomosis. These findings are consistent with a urinary leak. A suitable skin entry site was selected and marked. Local anesthesia was attained by infiltration with 1% lidocaine. A small dermatotomy was made. Using intermittent CT fluoroscopic guidance, an 18 gauge trocar needle was advanced to the left lower quadrant abdominal wall and into the complex fluid collection. An Amplatz wire was then advanced in a cephalad direction. The needle was removed and the tract dilated to 12 [place]. A Cook 17 [place] biliary drain with multiple sideholes was then advanced over the wire and formed. Aspiration yields approximately 900 mL foul-smelling frankly purulent fluid. A sample was sent for Gram stain and culture. Post drain placement imaging demonstrates near-total resolution of the fluid and contrast collection. The sideholes of the biliary drainage catheter span the expected location of the urine leak. The drain was fixed in place with 0 Prolene suture and an adhesive fixation device. A was connected to gravity bag drainage. Overall, the patient tolerated the procedure well. FINDINGS: Large left retroperitoneal urinoma secondary to leak at the right ureterocolonic anastomosis. IMPRESSION: 1. Imaging findings consistent with leak at the right ureterocolonic anastomosis with large left retroperitoneal urinoma. 2. Successful placement of a multi side-hole 14 French biliary drainage catheter with aspiration of 900 mL foul-smelling purulent fluid. A sample was sent for Gram stain and culture. Signed, Criselda Peaches, MD [facility] Electronically Signed   By: Jacqulynn Cadet M.D.   On: 02/11/2016 14:47    Labs:  CBC:  Recent Labs  01/26/16 1356  02/10/16 1342 02/10/16 1352 02/11/16 0431 02/12/16 0455  WBC 6.4  --  11.7*  --  12.0* 8.3  HGB 14.5  < > 9.7* 9.9* 8.4* 7.8*  HCT 44.8  < > 29.0* 29.0* 25.8* 24.4*  PLT 172  --  745*  --  709* 611*  < > = values in this interval not displayed.  COAGS:  Recent Labs  02/11/16 0815  INR 1.25    BMP:  Recent Labs  02/01/16 0558 02/10/16 1342 02/10/16 1352 02/11/16 0431 02/11/16 2339  NA 137 135 134* 137 138  K 4.2 4.1 4.0 3.8 3.6  CL 102 99* 96* 105 107  CO2 28 25  --  22 25  GLUCOSE 124* 107* 112* 148* 165*  BUN '11 19 18 14 11  '$ CALCIUM 7.8* 8.4*  --  8.0* 7.9*  CREATININE 0.61 0.65 0.60 0.51 0.63  GFRNONAA >60 >60  --  >60 >60  GFRAA >60 >60  --  >60 >60    LIVER FUNCTION TESTS:  Recent Labs  01/26/16 1356 02/10/16 1342  BILITOT 1.0 0.9  AST 31 106*  ALT 29 154*  ALKPHOS 58 104  PROT 6.6 6.5  ALBUMIN 4.4 2.4*    Assessment and Plan: S/p drainage of large left RP urinoma/abscess 2/23; AF; left RP fluid cx's- e coli; no new lab data; per urology pt may require bilat PCN's for urinary diversion early next week- procedure d/w pt   Electronically Signed: D. Rowe Robert 02/13/2016, 11:28 AM   I spent a total of 15 minutes at the the patient's bedside AND on the patient's hospital floor or unit, greater than 50% of which was counseling/coordinating care for left RP abscess/urinoma drain

## 2016-02-14 LAB — CBC
HEMATOCRIT: 28.9 % — AB (ref 36.0–46.0)
HEMOGLOBIN: 9.3 g/dL — AB (ref 12.0–15.0)
MCH: 28.4 pg (ref 26.0–34.0)
MCHC: 32.2 g/dL (ref 30.0–36.0)
MCV: 88.1 fL (ref 78.0–100.0)
Platelets: 562 10*3/uL — ABNORMAL HIGH (ref 150–400)
RBC: 3.28 MIL/uL — ABNORMAL LOW (ref 3.87–5.11)
RDW: 13.8 % (ref 11.5–15.5)
WBC: 6.6 10*3/uL (ref 4.0–10.5)

## 2016-02-14 LAB — BASIC METABOLIC PANEL
ANION GAP: 9 (ref 5–15)
BUN: 11 mg/dL (ref 6–20)
CHLORIDE: 98 mmol/L — AB (ref 101–111)
CO2: 27 mmol/L (ref 22–32)
Calcium: 8.4 mg/dL — ABNORMAL LOW (ref 8.9–10.3)
Creatinine, Ser: 0.53 mg/dL (ref 0.44–1.00)
GFR calc non Af Amer: >60 mL/min (ref >60)
Glucose, Bld: 133 mg/dL — ABNORMAL HIGH (ref 65–99)
Potassium: 3.6 mmol/L (ref 3.5–5.1)
Sodium: 134 mmol/L — ABNORMAL LOW (ref 135–145)

## 2016-02-14 LAB — CULTURE, BLOOD (ROUTINE X 2)

## 2016-02-14 NOTE — Progress Notes (Signed)
Patient ID: [name], female   DOB: Feb 19, 1946, 70 y.o.   MRN: [id number]    Subjective: Pt feeling somewhat better today.  She continues to have no urine output from urostomy with all UOP apparently from abdominal drain.  She remains without pain, nausea, vomiting, or flank pain.  She has been afebrile.  Objective: Vital signs in last 24 hours: Temp:  [98.1 F (36.7 C)-98.6 F (37 C)] 98.5 F (36.9 C) (02/26 4193) Pulse Rate:  [81-83] 81 (02/26 0633) Resp:  [16-18] 16 (02/26 0633) BP: (137-167)/(55-60) 167/60 mmHg (02/26 0633) SpO2:  [97 %-98 %] 97 % (02/26 0633)  Intake/Output from previous day: 02/25 0701 - 02/26 0700 In: 800 [P.O.:600; IV Piggyback:200] Out: 1135 [Drains:1135] Intake/Output this shift:    Physical Exam:  General: Alert and oriented CV: RRR Lungs: Clear Abdomen: Soft, ND, NT, Positive BS, Urostomy stoma pink with stents in place and minimal drainage in urostomy bag, Colostomy with air and stool in bag Incisions: C/D/I Ext: NT, No erythema  Lab Results:  Recent Labs  02/12/16 0455  HGB 7.8*  HCT 24.4*   CBC Latest Ref Rng 02/12/2016 02/11/2016 02/10/2016  WBC 4.0 - 10.5 K/uL 8.3 12.0(H) -  Hemoglobin 12.0 - 15.0 g/dL 7.8(L) 8.4(L) 9.9(L)  Hematocrit 36.0 - 46.0 % 24.4(L) 25.8(L) 29.0(L)  Platelets 150 - 400 K/uL 611(H) 709(H) -      BMET  Recent Labs  02/11/16 2339  NA 138  K 3.6  CL 107  CO2 25  GLUCOSE 165*  BUN 11  CREATININE 0.63  CALCIUM 7.9*   I attempted stent irrigation with saline.  Stents flushed easily with no subsequent urine output.  Studies/Results: Culture from abdominal drain: GPC with sensitivities pending, E. Coli (sensitive to everything but fluoroquinolones).  Assessment/Plan: S/P radical cystectomy and colon conduit and colostomy and colon resection with urine leak  1. Urine leak: Leak from right ureteral anastomotic site but possibly from left also as patient continues to have no urine output from  urostomy.  Abdominal drain is draining well and likely all urine output is from her drain.  This has stabilized her but her urine leak is not improving despite attempts to flush stents which appear to flush easily.  I have extensively discussed her situation with Dr. Earleen Newport from IR and we have a plan in place to proceed with bilateral nephrostomy tube placement tomorrow in an attempt to divert urine proximally with hopes to allow urine leak to seal and avoid operative intervention. NPO after midnight.  Will hold Lovenox in AM.  2. Continue imipenem pending final culture results (GP culture still pending, E. Coli is sensitive to imipenem).  Check CBC, BMP today.  3. PT/OT consults as patient will likely need SNF following hospitalization as she lives alone with her husband who is elderly and had a prior stroke. Continue dietary supplements.    LOS: 4 days   [name],LES 02/14/2016, 8:06 AM

## 2016-02-14 NOTE — Progress Notes (Signed)
Patient ID: [name], female   DOB: [birth year], 70 y.o.   MRN: [id number]    Referring Physician(s): [facility]/Borden  Corrie Mckusick  Chief Complaint: Left retroperitoneal abscess/urinoma    Subjective: Pt feeling a little better this am; denies N/V ; has some soreness at LLQ drain   Allergies: Ciprofloxacin and Penicillins  Medications: Prior to Admission medications   Medication Sig Start Date End Date Taking? Authorizing Provider  Docusate Calcium (STOOL SOFTENER PO) Take 1 capsule by mouth daily as needed (constipation.).   Yes Historical Provider, MD  isoniazid (NYDRAZID) 300 MG tablet Take 300 mg by mouth daily.   Yes Historical Provider, MD  ketorolac (TORADOL) 10 MG tablet Take 1 tablet (10 mg total) by mouth every 8 (eight) hours as needed for moderate pain. Post-operatively 02/05/16  Yes Alexis Frock, MD  oxyCODONE-acetaminophen (PERCOCET/ROXICET) 5-325 MG tablet Take 1-2 tablets by mouth every 6 (six) hours as needed for severe pain. Post-operatively 02/05/16  Yes Alexis Frock, MD     Vital Signs: BP 167/60 mmHg  Pulse 81  Temp(Src) 98.5 F (36.9 C) (Oral)  Resp 16  Ht '5\' 5"'$  (1.651 m)  Wt 145 lb (65.772 kg)  BMI 24.13 kg/m2  SpO2 97%  Physical Exam awake/alert; LLQ drain intact, insertion site ok, mildly tender; output 300 cc turbid , beige fluid; chest- sl dim BS bases; heart- RRR; ext - no sig edema; intact urostomy/colostomy, +BS; urostomy with minimal urine in bag  Imaging: Ct Abdomen Pelvis W Contrast  02/10/2016  CLINICAL DATA:  Intermittent abdominal pain. Left hip and groin pain. Leukocytosis. Necrosis at a colostomy stoma. Redness at a urostomy stoma. Ostomy stomal pain. Status post cystectomy, urostomy, hysterectomy, bilateral salpingo-oophorectomy, lymphadenectomy, lysis of adhesions, colostomy and sigmoid colectomy on 01/27/2016. Previous colon resection for colon cancer on 05/2012. History of bladder cancer. Previous chemotherapy completed.  Additional chemotherapy and radiation therapy planned. EXAM: CT ABDOMEN AND PELVIS WITH CONTRAST TECHNIQUE: Multidetector CT imaging of the abdomen and pelvis was performed using the standard protocol following bolus administration of intravenous contrast. CONTRAST:  158m OMNIPAQUE IOHEXOL 300 MG/ML  SOLN COMPARISON:  Abdomen radiograph dated 01/27/2016 and abdomen and pelvis CT dated 11/06/2015. FINDINGS: Lower chest: Interval mild right lower lobe atelectasis and minimal left lower lobe atelectasis. No pleural fluid. Hepatobiliary: Normal appearing liver and gallbladder. Pancreas: No mass, inflammatory changes, or other significant abnormality. Spleen: Within normal limits in size and appearance. Adrenals/Urinary Tract: Mild dilatation of both renal collecting systems. Bilateral of ureteral stent high are views are extending out the patient's urostomy, into the urostomy bag. Surgically absent urinary bladder. Normal appearing adrenal glands. Stomach/Bowel: No gastric or bowel dilatation. The distal transverse colon is exiting a left lower quadrant ostomy. The left colon and majority of the sigmoid colon is surgically absent. There is a surgical staple line at the inferior aspect of the cecum with the remainder of the sigmoid colon in contact with that portion of the cecum. These are possibly anastomosed. Vascular/Lymphatic: Atheromatous arterial calcifications. No enlarged lymph nodes. Reproductive: Surgically absent uterus and ovaries. Other: There is a large intraperitoneal and retroperitoneal fluid collection in the mid and lower abdomen and upper pelvis on the left. There is also multiple ocular saw of extraluminal air in that region and in the anterior abdomen. This fluid collection measures 14.7 x 10.5 cm on image number 58 of series 2 and 17.0 cm in length on coronal image number 42. No surrounding membrane more enhancement. There is also adjacent mesenteric soft  tissue stranding/edema. Musculoskeletal:  Lumbar and lower thoracic spine degenerative changes. IMPRESSION: 1. Large intraperitoneal and retroperitoneal fluid collection on the left, measuring 17.0 x 14.7 x 10.5 cm. This could represent a seroma or urinoma. 2. Small amount of residual postoperative air. Bowel perforation could also produce this appearance. 3. Mild bilateral hydronephrosis. 4. Mild bilateral lower lobe atelectasis. Electronically Signed   By: Claudie Revering M.D.   On: 02/10/2016 16:36   Ct Image Guided Drainage By Percutaneous Catheter  02/11/2016  INDICATION: 70 year old female status post extensive pelvic surgery with right lower quadrant diverting urinary conduit (colonic). She has a large left retroperitoneal fluid collection concerning for postoperative abscess versus urinoma. EXAM: CT GUIDED DRAINAGE OF LEFT RETROPERITONEAL ABSCESS MEDICATIONS: The patient is currently admitted to the hospital and receiving intravenous antibiotics. The antibiotics were administered within an appropriate time frame prior to the initiation of the procedure. ANESTHESIA/SEDATION: 1.5 mg IV Versed 50 mcg IV Fentanyl Moderate Sedation Time:  31 The patient was continuously monitored during the procedure by the interventional radiology nurse under my direct supervision. COMPLICATIONS: None immediate. TECHNIQUE: Informed written consent was obtained from the patient after a thorough discussion of the procedural risks, benefits and alternatives. All questions were addressed. Maximal Sterile Barrier Technique was utilized including caps, mask, sterile gowns, sterile gloves, sterile drape, hand hygiene and skin antiseptic. A timeout was performed prior to the initiation of the procedure. PROCEDURE: Informed written consent was obtained from the patient. A time-out procedure was performed. A planning CT scan was performed. The largest left lower retroperitoneal fluid collection is now filled with excreted contrast material. The excreted contrast can be seen  tracking directly to the right ureterocolonic anastomosis. These findings are consistent with a urinary leak. A suitable skin entry site was selected and marked. Local anesthesia was attained by infiltration with 1% lidocaine. A small dermatotomy was made. Using intermittent CT fluoroscopic guidance, an 18 gauge trocar needle was advanced to the left lower quadrant abdominal wall and into the complex fluid collection. An Amplatz wire was then advanced in a cephalad direction. The needle was removed and the tract dilated to 12 [place]. A Cook 13 [place] biliary drain with multiple sideholes was then advanced over the wire and formed. Aspiration yields approximately 900 mL foul-smelling frankly purulent fluid. A sample was sent for Gram stain and culture. Post drain placement imaging demonstrates near-total resolution of the fluid and contrast collection. The sideholes of the biliary drainage catheter span the expected location of the urine leak. The drain was fixed in place with 0 Prolene suture and an adhesive fixation device. A was connected to gravity bag drainage. Overall, the patient tolerated the procedure well. FINDINGS: Large left retroperitoneal urinoma secondary to leak at the right ureterocolonic anastomosis. IMPRESSION: 1. Imaging findings consistent with leak at the right ureterocolonic anastomosis with large left retroperitoneal urinoma. 2. Successful placement of a multi side-hole 14 French biliary drainage catheter with aspiration of 900 mL foul-smelling purulent fluid. A sample was sent for Gram stain and culture. Signed, Criselda Peaches, MD [facility] Electronically Signed   By: Jacqulynn Cadet M.D.   On: 02/11/2016 14:47    Labs:  CBC:  Recent Labs  02/10/16 1342 02/10/16 1352 02/11/16 0431 02/12/16 0455 02/14/16 0536  WBC 11.7*  --  12.0* 8.3 6.6  HGB 9.7* 9.9* 8.4* 7.8* 9.3*  HCT 29.0* 29.0* 25.8* 24.4* 28.9*  PLT  745*  --  709* 611* 562*  COAGS:  Recent Labs  02/11/16 0815  INR 1.25    BMP:  Recent Labs  02/10/16 1342 02/10/16 1352 02/11/16 0431 02/11/16 2339 02/14/16 0536  NA 135 134* 137 138 134*  K 4.1 4.0 3.8 3.6 3.6  CL 99* 96* 105 107 98*  CO2 25  --  '22 25 27  '$ GLUCOSE 107* 112* 148* 165* 133*  BUN '19 18 14 11 11  '$ CALCIUM 8.4*  --  8.0* 7.9* 8.4*  CREATININE 0.65 0.60 0.51 0.63 0.53  GFRNONAA >60  --  >60 >60 >60  GFRAA >60  --  >60 >60 >60    LIVER FUNCTION TESTS:  Recent Labs  01/26/16 1356 02/10/16 1342  BILITOT 1.0 0.9  AST 31 106*  ALT 29 154*  ALKPHOS 58 104  PROT 6.6 6.5  ALBUMIN 4.4 2.4*    Assessment and Plan: S/p drainage of large left RP urinoma/abscess 2/23; AF; left RP fluid cx's- e coli; AF; WBC 6.6, HGB 9.3, PLTS 562K, CREAT 0.53, K 3.6; plan at this time is for bilat perc nephrostomies on 2/27 for urinary diversion;Risks and benefits discussed with the patient/husband including, but not limited to infection, bleeding, significant bleeding causing loss or decrease in renal function or damage to adjacent structures. All of the patient's questions were answered, patient is agreeable to proceed.Consent signed and in chart.     Electronically Signed: D. Rowe Robert 02/14/2016, 11:14 AM   I spent a total of 15 minutes at the the patient's bedside AND on the patient's hospital floor or unit, greater than 50% of which was counseling/coordinating care for left pelvic drain, bilat perc nephrostomy tube placements

## 2016-02-14 NOTE — Evaluation (Signed)
Occupational Therapy Evaluation Patient Details Name: [name] MRN: [id number] DOB: Sep 16, 1946 Today's Date: 02/14/2016    History of Present Illness This 70 y.o. female admitted with c/o pain to stoma shooting into her back.  Dx:  with uroma with  apparent anastomotic leak of at least the Rt ueretral anastomosis.  Percutaneous drain placed.  PMH includes:  Bladder CA with ercent radical cystectomy with pelvic exenteration, hysterectomy, bil. salingo-oophorectomy, pelvic lymphadenectomy, colon conduit urinary diversion and lysis of adhesions, sigmoid colectomy and end colostomy 01/27/16.    Clinical Impression   Pt admitted with above. She demonstrates the below listed deficits and will benefit from continued OT to maximize safety and independence with BADLs.   Pt presents with generalized weakness, mildly impaired balance, and acute pain.  She requires min guard assist for functional mobility and mod A for LB ADLs due to pain.   She lives with her spouse who has dementia, and is his primary caregiver.  She has little outside support.  Feel she would benefit from short term SNF to allow her to return home mod I.       Follow Up Recommendations  SNF;Supervision/Assistance - 24 hour    Equipment Recommendations  None recommended by OT    Recommendations for Other Services       Precautions / Restrictions Precautions Precautions: Fall Precaution Comments: colostomy, urostomy       Mobility Bed Mobility Overal bed mobility: Needs Assistance Bed Mobility: Sit to Supine       Sit to supine: Supervision   General bed mobility comments: HOB raised, pt uses bed rails to self assist  Transfers Overall transfer level: Needs assistance Equipment used: Rolling walker (2 wheeled) Transfers: Sit to/from [facility] Sit to Stand: Min guard Stand pivot transfers: Min guard       General transfer comment: cues for hand placement     Balance Overall balance  assessment: Needs assistance Sitting-balance support: Feet supported Sitting balance-Leahy Scale: Good     Standing balance support: During functional activity;No upper extremity supported Standing balance-Leahy Scale: Fair                              ADL Overall ADL's : Needs assistance/impaired Eating/Feeding: Independent   Grooming: Wash/dry hands;Oral care;Wash/dry face;Brushing hair;Minimal assistance;Standing   Upper Body Bathing: Set up;Sitting   Lower Body Bathing: Moderate assistance;Sit to/from stand   Upper Body Dressing : Set up;Sitting   Lower Body Dressing: Moderate assistance;Sit to/from stand   Toilet Transfer: Min guard;Ambulation;Comfort height toilet;RW   Toileting- Clothing Manipulation and Hygiene: Supervision/safety (sitting.  SHe is able to manage colostomy and urostomy )       Functional mobility during ADLs: Min guard;Rolling walker General ADL Comments: Pt fatigues quickly.  She demonstrates difficulty accessing her LEs due to pain      Vision     Perception     Praxis      Pertinent Vitals/Pain Pain Assessment: 0-10 Pain Score: 3  Pain Location: abdomen  Pain Descriptors / Indicators: Aching Pain Intervention(s): Monitored during session     Hand Dominance Right   Extremity/Trunk Assessment Upper Extremity Assessment Upper Extremity Assessment: Generalized weakness   Lower Extremity Assessment Lower Extremity Assessment: Defer to PT evaluation   Cervical / Trunk Assessment Cervical / Trunk Assessment: Normal   Communication Communication Communication: No difficulties   Cognition Arousal/Alertness: Awake/alert Behavior During Therapy: WFL for tasks assessed/performed Overall Cognitive  Status: Within Functional Limits for tasks assessed                     General Comments       Exercises       Shoulder Instructions      Home Living Family/patient expects to be discharged to:: Skilled nursing  facility Living Arrangements: Spouse/significant other Available Help at Discharge: Family Type of Home: House Home Access: Stairs to enter Technical brewer of Steps: 2   Home Layout: One level     Bathroom Shower/Tub: Occupational psychologist: [place]: Shower seat;Grab bars - toilet;Grab bars - tub/shower   Additional Comments: Pt lives with spouse and grand daughter       Prior Functioning/Environment Level of Independence: Independent        Comments: Pt is caregiver for spouse.  [place] daughter assists with IADLs.  Pt was home for 4 days and was able to perform ADLs "barely".     OT Diagnosis: Generalized weakness;Acute pain   OT Problem List: Decreased strength;Decreased activity tolerance;Impaired balance (sitting and/or standing);Decreased safety awareness;Pain   OT Treatment/Interventions: Self-care/ADL training;DME and/or AE instruction;Therapeutic activities;Patient/family education;Balance training    OT Goals(Current goals can be found in the care plan section) Acute Rehab OT Goals Patient Stated Goal: to feel better  OT Goal Formulation: With patient Time For Goal Achievement: 02/28/16 Potential to Achieve Goals: Good ADL Goals Pt Will Perform Grooming: with supervision;standing Pt Will Perform Lower Body Bathing: with supervision;with adaptive equipment;sit to/from stand Pt Will Perform Upper Body Dressing: with supervision;sitting Pt Will Perform Lower Body Dressing: with supervision;with adaptive equipment;sit to/from stand Pt Will Transfer to Toilet: with supervision;ambulating Pt Will Perform Toileting - Clothing Manipulation and hygiene: with supervision;sit to/from stand  OT Frequency: Min 2X/week   Barriers to D/C: Decreased caregiver support          Co-evaluation              End of Session Equipment Utilized During Treatment: Rolling walker Nurse Communication: Mobility status  Activity Tolerance:  Patient limited by fatigue Patient left: in bed;with call bell/phone within reach;with family/visitor present   Time: 5916-3846 OT Time Calculation (min): 19 min Charges:  OT General Charges $OT Visit: 1 Procedure OT Evaluation $OT Eval Moderate Complexity: 1 Procedure G-Codes:    Clydine Parkison M 2016-03-13, 1:46 PM

## 2016-02-14 NOTE — Evaluation (Signed)
Physical Therapy Evaluation Patient Details Name: [name] MRN: [id number] DOB: 1946-07-13 Today's Date: 02/14/2016 She lives with her spouse who has dementia, and is his primary caregiver. She has little outside support. Feel she would benefit from short term SNF to allow her to return home mod I.    History of Present Illness  This 70 y.o. female admitted with c/o pain to stoma shooting into her back.  Dx:  with uroma with  apparent anastomotic leak of at least the Rt ueretral anastomosis.  Percutaneous drain placed.  PMH includes:  Bladder CA with ercent radical cystectomy with pelvic exenteration, hysterectomy, bil. salingo-oophorectomy, pelvic lymphadenectomy, colon conduit urinary diversion and lysis of adhesions, sigmoid colectomy and end colostomy 01/27/16.   Clinical Impression  The patient is very motivated to mobilize. Reports planned procedure tomorrow.  Pt admitted with above diagnosis. Pt currently with functional limitations due to the deficits listed below (see PT Problem List).  Pt will benefit from skilled PT to increase their independence and safety with mobility to allow discharge to the venue listed below.  Patient reports limited outside  Help and is caregiver for  Spouse with dementia and would like short term SNF.     Follow Up Recommendations SNF;Supervision/Assistance - 24 hour    Equipment Recommendations  None recommended by PT    Recommendations for Other Services       Precautions / Restrictions Precautions Precautions: Fall Precaution Comments: colostomy, urostomy , drain on L       Mobility  Bed Mobility Overal bed mobility: Needs Assistance Bed Mobility: Rolling Rolling: Supervision     Sit to supine: Supervision   General bed mobility comments: HOB raised, pt uses bed rails to self assist, encouraged to roll, protect the abdomen  Transfers Overall transfer level: Needs assistance Equipment used: Rolling walker (2 wheeled) Transfers:  Sit to/from Stand Sit to Stand: Min guard Stand pivot transfers: Min guard       General transfer comment: cues for hand placement   Ambulation/Gait Ambulation/Gait assistance: Min guard Ambulation Distance (Feet): 125 Feet Assistive device: Rolling walker (2 wheeled) Gait Pattern/deviations: Step-through pattern     General Gait Details: slow and steady, noted dyspnea near the end  Stairs            Wheelchair Mobility    Modified Rankin (Stroke Patients Only)       Balance Overall balance assessment: Needs assistance Sitting-balance support: Feet supported Sitting balance-Leahy Scale: Good     Standing balance support: During functional activity Standing balance-Leahy Scale: Fair                               Pertinent Vitals/Pain Pain Assessment: 0-10 Pain Score: 3  Pain Location: abdomen Pain Descriptors / Indicators: Aching;Tender Pain Intervention(s): Monitored during session;Premedicated before session    Home Living Family/patient expects to be discharged to:: Skilled nursing facility Living Arrangements: Spouse/significant other Available Help at Discharge: Family Type of Home: House Home Access: Stairs to enter   Technical brewer of Steps: 2 Home Layout: One level Home Equipment: Shower seat;Grab bars - toilet;Grab bars - tub/shower Additional Comments: Pt lives with spouse and grand daughter     Prior Function Level of Independence: Independent         Comments: Pt is caregiver for spouse.  [place] daughter assists with IADLs.  Pt was home for 4 days and was able to perform ADLs "barely".  Hand Dominance   Dominant Hand: Right    Extremity/Trunk Assessment   Upper Extremity Assessment: Defer to OT evaluation           Lower Extremity Assessment: Generalized weakness      Cervical / Trunk Assessment: Normal  Communication   Communication: No difficulties  Cognition Arousal/Alertness:  Awake/alert Behavior During Therapy: WFL for tasks assessed/performed Overall Cognitive Status: Within Functional Limits for tasks assessed                      General Comments      Exercises        Assessment/Plan    PT Assessment Patient needs continued PT services  PT Diagnosis Difficulty walking;Generalized weakness;Acute pain   PT Problem List Decreased strength;Decreased activity tolerance;Decreased balance;Decreased mobility;Decreased knowledge of precautions;Decreased safety awareness;Decreased knowledge of use of DME;Cardiopulmonary status limiting activity  PT Treatment Interventions DME instruction;Gait training;Functional mobility training;Therapeutic activities;Therapeutic exercise;Patient/family education   PT Goals (Current goals can be found in the Care Plan section) Acute Rehab PT Goals Patient Stated Goal: to feel better  PT Goal Formulation: With patient Time For Goal Achievement: 02/28/16 Potential to Achieve Goals: Good    Frequency Min 3X/week   Barriers to discharge Decreased caregiver support spouse has dementia    Co-evaluation               End of Session   Activity Tolerance: Patient tolerated treatment well Patient left: in bed;with call bell/phone within reach Nurse Communication: Mobility status         Time: 0165-5374 PT Time Calculation (min) (ACUTE ONLY): 13 min   Charges:   PT Evaluation $PT Eval Low Complexity: 1 Procedure     PT G CodesClaretha Cooper 02/14/2016, 4:31 PM Tresa Endo PT [phone]

## 2016-02-14 NOTE — Progress Notes (Signed)
Pharmacy Antibiotic Note  [name] is a 70 y.o. female with bladder cancer s/p cystectomy/urostomy/TAH/colectomy/colostomy on  01/27/2016 and recently discharged from [facility] on [facility]/16.  She presented back to the hospital on 2/22 with c/o pain to stoma and shooting to the back.  CCS suspects superficial ischemia of her colostomy.  Patient now has elevated WBC.  To start primaxin for suspected intra-abdominal infection.  Of note, patient has PCN listed as an allergy (MD is aware) with rxn noted as hives.  Coag-negative staph 1/2 blood cultures is likely contaminant E. Coli from peritoneal cavity is only resistant to ciprofloxacin  Day #4 imipenem   Plan:  Imipenem 500 mg IV q8h remains appropriately dosed  No data in [facility] to suggest she has received cephalosorin (cross-reactivity low if use advanced generation, like ceftriaxone). Alternatively, could change to ertapenem for once-daily dosing and no need for pseudomonas coverage.   Height: '5\' 5"'$  (165.1 cm) Weight: 145 lb (65.772 kg) IBW/kg (Calculated) : 57  Temp (24hrs), Avg:98.4 F (36.9 C), Min:98.1 F (36.7 C), Max:98.6 F (37 C)   Recent Labs Lab 02/10/16 1342 02/10/16 1352 02/10/16 1353 02/11/16 0431 02/11/16 2339 02/12/16 0455 02/14/16 0536  WBC 11.7*  --   --  12.0*  --  8.3 6.6  CREATININE 0.65 0.60  --  0.51 0.63  --  0.53  LATICACIDVEN  --   --  1.44  --   --   --   --     Estimated Creatinine Clearance: 59.7 mL/min (by C-G formula based on Cr of 0.53).    Allergies  Allergen Reactions  . Ciprofloxacin Shortness Of Breath and Swelling  . Penicillins Hives    Has patient had a PCN reaction causing immediate rash, facial/tongue/throat swelling, SOB or lightheadedness with hypotension: No Has patient had a PCN reaction causing severe rash involving mucus membranes or skin necrosis: No Has patient had a PCN reaction that required hospitalization Yes Has patient had a PCN reaction occurring within the last 10 years:  No If all of the above answers are "NO", then may proceed with Cephalosporin use.   Antimicrobials this admission: 2/23 primaxin>>  Levels/dose changes this admission: n/a  Microbiology results: 2/22 MRSA PCR (+) on chlx and bactroban 2/22 bcx x2: CoNS 1/2 likely contaminant 2/23 peritoneal fluid cx: e. Coli (R to cipro only)  Thank you for allowing pharmacy to be a part of this patient's care.  Doreene Eland, PharmD, BCPS.   Pager: [phone] 02/14/2016 12:25 PM

## 2016-02-15 DIAGNOSIS — E43 Unspecified severe protein-calorie malnutrition: Secondary | ICD-10-CM | POA: Insufficient documentation

## 2016-02-15 LAB — BODY FLUID CULTURE: Special Requests: NORMAL

## 2016-02-15 LAB — BASIC METABOLIC PANEL
Anion gap: 10 (ref 5–15)
BUN: 11 mg/dL (ref 6–20)
CO2: 28 mmol/L (ref 22–32)
CREATININE: 0.5 mg/dL (ref 0.44–1.00)
Calcium: 8.3 mg/dL — ABNORMAL LOW (ref 8.9–10.3)
Chloride: 100 mmol/L — ABNORMAL LOW (ref 101–111)
Glucose, Bld: 129 mg/dL — ABNORMAL HIGH (ref 65–99)
POTASSIUM: 3.6 mmol/L (ref 3.5–5.1)
SODIUM: 138 mmol/L (ref 135–145)

## 2016-02-15 LAB — CBC WITH DIFFERENTIAL/PLATELET
BASOS ABS: 0 10*3/uL (ref 0.0–0.1)
Basophils Relative: 0 %
Eosinophils Absolute: 0.1 10*3/uL (ref 0.0–0.7)
Eosinophils Relative: 1 %
HEMATOCRIT: 27.3 % — AB (ref 36.0–46.0)
HEMOGLOBIN: 8.6 g/dL — AB (ref 12.0–15.0)
LYMPHS ABS: 1.3 10*3/uL (ref 0.7–4.0)
Lymphocytes Relative: 19 %
MCH: 28.3 pg (ref 26.0–34.0)
MCHC: 31.5 g/dL (ref 30.0–36.0)
MCV: 89.8 fL (ref 78.0–100.0)
MONO ABS: 0.9 10*3/uL (ref 0.1–1.0)
MONOS PCT: 13 %
NEUTROS ABS: 4.7 10*3/uL (ref 1.7–7.7)
Neutrophils Relative %: 67 %
Platelets: 490 10*3/uL — ABNORMAL HIGH (ref 150–400)
RBC: 3.04 MIL/uL — ABNORMAL LOW (ref 3.87–5.11)
RDW: 14.3 % (ref 11.5–15.5)
WBC: 7 10*3/uL (ref 4.0–10.5)

## 2016-02-15 LAB — CULTURE, BLOOD (ROUTINE X 2): Culture: NO GROWTH

## 2016-02-15 NOTE — Clinical Social Work Placement (Signed)
   CLINICAL SOCIAL WORK PLACEMENT  NOTE  Date:  02/15/2016  Patient Details  Name: [name] MRN: [id number] Date of Birth: 10-13-1946  Clinical Social Work is seeking post-discharge placement for this patient at the B and E level of care (*CSW will initial, date and re-position this form in  chart as items are completed):  Yes   Patient/family provided with [facility]'s list of facilities offering this level of care within the geographic area requested by the patient (or if unable, by the patient's family).  Yes   Patient/family informed of their freedom to choose among providers that offer the needed level of care, that participate in Medicare, Medicaid or managed care program needed by the patient, have an available bed and are willing to accept the patient.  Yes   Patient/family informed of [facility]'s ownership interest in [facility] and [facility], as well as of the fact that they are under no obligation to receive care at these facilities.  PASRR submitted to EDS on 02/15/16     PASRR number received on 02/15/16     Existing PASRR number confirmed on       FL2 transmitted to all facilities in geographic area requested by pt/family on 02/15/16     FL2 transmitted to all facilities within larger geographic area on       Patient informed that his/her managed care company has contracts with or will negotiate with certain facilities, including the following:            Patient/family informed of bed offers received.  Patient chooses bed at       Physician recommends and patient chooses bed at      Patient to be transferred to   on  .  Patient to be transferred to facility by       Patient family notified on   of transfer.  Name of family member notified:        PHYSICIAN       Additional Comment:    _______________________________________________ Luretha Rued, [place] 02/15/2016,  2:54 PM

## 2016-02-15 NOTE — Progress Notes (Signed)
  Subjective:  1 - Stage 4 Bladder Cancer - s/p open cystectomy / TAH+BSO / colostomy / colon conduit 01/27/2016 in combined Urol and Gen Surge procedure.   2 - Urinoma / Urine Leak - new large urinoma on admission CT 2/23 with aparant leak from butt end of colon conduit / Rt ureteral anastamosis. Some dilation of butt end of conduit, stents in adequate position.   3 - Bacteruria - UCX 2/23 from urinoma with e. Coli / pending. 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 NO clinical sequelae nausea / fevers / emesis or free air by intake CT 2/23.   Today "Patty" is stable. Minimal UOP via urostomy, nearly all from urinoma. No fevers, nausea / emesis.   Objective: Vital signs in last 24 hours: Temp:  [98.6 F (37 C)-99 F (37.2 C)] 98.6 F (37 C) (02/27 0621) Pulse Rate:  [81-88] 86 (02/27 0621) Resp:  [16] 16 (02/27 0621) BP: (136-152)/(59-92) 152/61 mmHg (02/27 0621) SpO2:  [95 %-97 %] 97 % (02/27 0621) Last BM Date: 02/14/16  Intake/Output from previous day: 02/26 0701 - 02/27 0700 In: 960 [P.O.:360; IV Piggyback:600] Out: 2425 [Urine:1075; Drains:1150; Stool:200] Intake/Output this shift:    NAD, AOx3,  RRR NO wound drainage RLQ urostomy pink with distal stents in place, minmal UOP Lt side urinioma drain with copious urine, on gravity LLQ colosotmy patent of stool / gas, duske edges but NOT necrotic. SCD's in place.   Lab Results:   Recent Labs  02/14/16 0536 02/15/16 0427  WBC 6.6 7.0  HGB 9.3* 8.6*  HCT 28.9* 27.3*  PLT 562* 490*   BMET  Recent Labs  02/14/16 0536 02/15/16 0427  NA 134* 138  K 3.6 3.6  CL 98* 100*  CO2 27 28  GLUCOSE 133* 129*  BUN 11 11  CREATININE 0.53 0.50  CALCIUM 8.4* 8.3*   PT/INR No results for input(s): LABPROT, INR in the last 72 hours. ABG No results for input(s): PHART, HCO3 in the last 72 hours.  Invalid input(s): PCO2, PO2  Studies/Results: No results  found.  Anti-infectives: Anti-infectives    Start     Dose/Rate Route Frequency Ordered Stop   02/11/16 0900  imipenem-cilastatin (PRIMAXIN) 500 mg in sodium chloride 0.9 % 100 mL IVPB     500 mg 200 mL/hr over 30 Minutes Intravenous Every 8 hours 02/11/16 8280        Assessment/Plan:  1 - Stage 4 Bladder Cancer - may be candidate for chemo eventually, no furhter cancer-directed therapy in house.   2 - Urinoma / Urine Leak - Will try placement of large bore foley into conduit in case relative compression at fascia is etiology. I would like to give that 24-48 hrs before resorting to neph tubes. Explained rationale to pt and she is in agreement. Also discussed with RN.   3 - Bacteruria - will narrow ABX coverage once CX final, continue primaxin for now.   4 - Dusky Colostomy - Gen surg following, agree with non-operative management as functioning well.  Remain in house.   [facility], Fitz Matsuo 02/15/2016

## 2016-02-15 NOTE — Consult Note (Signed)
WOC ostomy follow up: Colon Conduit (Urostomy) Stoma type/location: RUQ colon conduit. Seen today with Dr. Tresa Moore.  7F catheter inserted into conduit past fascia after digital exploration. Balloon inserted with approximately 105ms of NS; catheter is secure, but movable in the conduit. Stomal assessment/size: 1 and 1/4 inch oval stoma with 2 stents intact. Peristomal assessment: intact, erythematous.  Depression from 7-11 o'clock. Treatment options for stomal/peristomal skin:  Output: mucous only.  No urine from the stoma at this time. Ostomy pouching: 1pc convex with skin barrier ring.  Stents and catheter are fed through aperture in pouch and coiled in pouching system. Education provided: Patient supported during procedure Enrolled patient in [facility] program: Yes (last admission)  WOC ostomy follow up Stoma type/location: LUQ colostomy Stomal assessment/size: 1 and 1/8 x 1 and 3/4 oval. Black "cap" of necrotic tissue is sloughing, mucocutaneous separation at 3 and 9 o'clock with purulent drainage from these sites. Peristomal assessment: Intact, with mild erythema circumferentially Treatment options for stomal/peristomal skin: Skin barrier ring Output: flatus and a small amount of liquid stool in pouch Ostomy pouching: 1pc. Convex with skin barrier ring Education provided: Patient supported during procedure Enrolled [facility] in [facility] program: Yes (last admission)   Supplies for both ostomies are in room with patterns in the event that pouching system changes are required in my absence.  WPaysonnursing team will follow, and will remain available to this patient, the nursing and medical teams.  Thanks, LMaudie Flakes MSN, RN, [place] CArther Abbott Pager# [phone]

## 2016-02-15 NOTE — NC FL2 (Signed)
[place] LEVEL OF CARE SCREENING TOOL     IDENTIFICATION  Patient Name: [name] Birthdate: 03-22-1946 Sex: female Admission Date (Current Location): 02/10/2016  [facility] and [place] Number:  Engineer, manufacturing systems and Address:  [facility],  [place], [place]      Provider Number: [id number]  Attending Physician Name and Address:  Alexis Frock, MD  Relative Name and Phone Number:       Current Level of Care: Hospital Recommended Level of Care: [place] Prior Approval Number:    Date Approved/Denied: 02/15/16 PASRR Number: [id number]  Discharge Plan: SNF    Current Diagnoses: Patient Active Problem List   Diagnosis Date Noted  . Protein-calorie malnutrition, severe 02/15/2016  . Paraureteric urinoma   . Intra-abdominal fluid collection   . Urinoma 02/10/2016  . Bladder cancer s/p cystectomy/urostomy/TAH/colectomy/colostomy 01/27/2016 01/27/2016  . 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 01/27/2016    Orientation RESPIRATION BLADDER Height & Weight     Self, Time, Situation, Place  Normal Indwelling catheter Weight: 65.772 kg (145 lb) Height:  '5\' 5"'$  (165.1 cm)  BEHAVIORAL SYMPTOMS/MOOD NEUROLOGICAL BOWEL NUTRITION STATUS  Other (Comment) (NO BEHAVIORS)   Colostomy  (NPO - expect to progress)  AMBULATORY STATUS COMMUNICATION OF NEEDS Skin   Limited Assist Verbally Surgical wounds  1 IR perc drain                       Personal Care Assistance Level of Assistance  Bathing, Feeding, Dressing Bathing Assistance: Limited assistance Feeding assistance: Independent Dressing Assistance: Limited assistance     Functional Limitations Info  Sight, Hearing, Speech Sight Info: Adequate Hearing Info: Adequate Speech Info: Adequate    SPECIAL CARE FACTORS FREQUENCY  PT (By licensed PT), OT (By licensed OT)     PT Frequency: 5 x wk OT Frequency: 5 x wk             Contractures Contractures Info: Not present    Additional Factors Info  Code Status, Isolation Precautions Code Status Info: FULL CODE       Isolation Precautions Info: PCR + MRSA , no encounter     Current Medications (02/15/2016):  This is the current hospital active medication list Current Facility-Administered Medications  Medication Dose Route Frequency Provider Last Rate Last Dose  . acetaminophen (TYLENOL) tablet 650 mg  650 mg Oral Q6H PRN Emina Riebock, NP       Or  . acetaminophen (TYLENOL) suppository 650 mg  650 mg Rectal Q6H PRN Emina Riebock, NP      . enoxaparin (LOVENOX) injection 40 mg  40 mg Subcutaneous Q24H Emina Riebock, NP   40 mg at 02/14/16 1047  . feeding supplement (ENSURE ENLIVE) (ENSURE ENLIVE) liquid 237 mL  237 mL Oral Q24H Satira Anis Ward, RD   237 mL at 02/14/16 2105  . HYDROcodone-acetaminophen (NORCO/VICODIN) 5-325 MG per tablet 1-2 tablet  1-2 tablet Oral Q6H PRN Raynelle Bring, MD      . HYDROmorphone (DILAUDID) injection 1 mg  1 mg Intravenous Q2H PRN Emina Riebock, NP   1 mg at 02/15/16 0817  . imipenem-cilastatin (PRIMAXIN) 500 mg in sodium chloride 0.9 % 100 mL IVPB  500 mg Intravenous Q8H Excell Seltzer, MD   500 mg at 02/15/16 0811  . mupirocin ointment (BACTROBAN) 2 % 1 application  1 application Nasal BID Erby Pian, NP   1 application at 54/65/03  1024  . ondansetron (ZOFRAN-ODT) disintegrating tablet 4 mg  4 mg Oral Q6H PRN Emina Riebock, NP       Or  . ondansetron (ZOFRAN) injection 4 mg  4 mg Intravenous Q6H PRN Erby Pian, NP         Discharge Medications: Please see discharge summary for a list of discharge medications.  Relevant Imaging Results:  Relevant Lab Results:   Additional Information SS # [id number]  Earnest Thalman, [place] An, LCSW

## 2016-02-15 NOTE — Progress Notes (Signed)
Patient ID: [name], female   DOB: 06/19/1946, 70 y.o.   MRN: [id number]     [facility]      [address] [place], [place] [address]    Phone: [phone] FAX: [phone]     Subjective: Appetite is good. Ambulating.  Little pain. 1L out from drain, looks like urine.   Objective:  Vital signs:  Filed Vitals:   02/14/16 0633 02/14/16 1442 02/14/16 2210 02/15/16 0621  BP: 167/60 144/59 136/92 152/61  Pulse: 81 81 88 86  Temp: 98.5 F (36.9 C) 99 F (37.2 C) 98.7 F (37.1 C) 98.6 F (37 C)  TempSrc: Oral Oral Oral Oral  Resp: 16 16  16   Height:      Weight:      SpO2: 97% 96% 95% 97%    Last BM Date: 02/14/16  Intake/Output   Yesterday:  02/26 0701 - 02/27 0700 In: 960 [P.O.:360; IV Piggyback:600] Out: 2425 [Urine:1075; Drains:1150; Stool:200] This shift:    I/O last 3 completed shifts: In: 9476 [P.O.:720; IV Piggyback:700] Out: 3100 [Urine:1075; Drains:1825; Stool:200]    Physical Exam: General: Pt awake/alert/oriented x4 in no acute distress  Abdomen: Soft. Nondistended. LUQ urostomy functioning. RLQ stoma is superficially necrotic, now pink at 5/6 oclock, functioning. llq drain. No evidence of peritonitis. No incarcerated hernias.     Problem List:   Active Problems:   Urinoma   Intra-abdominal fluid collection   Paraureteric urinoma    Results:   Labs: Results for orders placed or performed during the hospital encounter of 02/10/16 (from the past 48 hour(s))  CBC     Status: Abnormal   Collection Time: 02/14/16  5:36 AM  Result Value Ref Range   WBC 6.6 4.0 - 10.5 K/uL   RBC 3.28 (L) 3.87 - 5.11 MIL/uL   Hemoglobin 9.3 (L) 12.0 - 15.0 g/dL   HCT 28.9 (L) 36.0 - 46.0 %   MCV 88.1 78.0 - 100.0 fL   MCH 28.4 26.0 - 34.0 pg   MCHC 32.2 30.0 - 36.0 g/dL   RDW 13.8 11.5 - 15.5 %   Platelets 562 (H) 150 - 400 K/uL  Basic metabolic panel     Status: Abnormal   Collection Time:  02/14/16  5:36 AM  Result Value Ref Range   Sodium 134 (L) 135 - 145 mmol/L   Potassium 3.6 3.5 - 5.1 mmol/L   Chloride 98 (L) 101 - 111 mmol/L   CO2 27 22 - 32 mmol/L   Glucose, Bld 133 (H) 65 - 99 mg/dL   BUN 11 6 - 20 mg/dL   Creatinine, Ser 0.53 0.44 - 1.00 mg/dL   Calcium 8.4 (L) 8.9 - 10.3 mg/dL   GFR calc non Af Amer >60 >60 mL/min   GFR calc Af Amer >60 >60 mL/min    Comment: (NOTE) The eGFR has been calculated using the CKD EPI equation. This calculation has not been validated in all clinical situations. eGFR's persistently <60 mL/min signify possible Chronic Kidney Disease.    Anion gap 9 5 - 15  CBC with Differential/Platelet     Status: Abnormal   Collection Time: 02/15/16  4:27 AM  Result Value Ref Range   WBC 7.0 4.0 - 10.5 K/uL   RBC 3.04 (L) 3.87 - 5.11 MIL/uL   Hemoglobin 8.6 (L) 12.0 - 15.0 g/dL   HCT 27.3 (L) 36.0 - 46.0 %   MCV 89.8 78.0 - 100.0 fL  MCH 28.3 26.0 - 34.0 pg   MCHC 31.5 30.0 - 36.0 g/dL   RDW 14.3 11.5 - 15.5 %   Platelets 490 (H) 150 - 400 K/uL   Neutrophils Relative % 67 %   Lymphocytes Relative 19 %   Monocytes Relative 13 %   Eosinophils Relative 1 %   Basophils Relative 0 %   Neutro Abs 4.7 1.7 - 7.7 K/uL   Lymphs Abs 1.3 0.7 - 4.0 K/uL   Monocytes Absolute 0.9 0.1 - 1.0 K/uL   Eosinophils Absolute 0.1 0.0 - 0.7 K/uL   Basophils Absolute 0.0 0.0 - 0.1 K/uL   RBC Morphology POLYCHROMASIA PRESENT    WBC Morphology MILD LEFT SHIFT (1-5% METAS, OCC MYELO, OCC BANDS)   Basic metabolic panel     Status: Abnormal   Collection Time: 02/15/16  4:27 AM  Result Value Ref Range   Sodium 138 135 - 145 mmol/L   Potassium 3.6 3.5 - 5.1 mmol/L   Chloride 100 (L) 101 - 111 mmol/L   CO2 28 22 - 32 mmol/L   Glucose, Bld 129 (H) 65 - 99 mg/dL   BUN 11 6 - 20 mg/dL   Creatinine, Ser 0.50 0.44 - 1.00 mg/dL   Calcium 8.3 (L) 8.9 - 10.3 mg/dL   GFR calc non Af Amer >60 >60 mL/min   GFR calc Af Amer >60 >60 mL/min    Comment: (NOTE) The eGFR  has been calculated using the CKD EPI equation. This calculation has not been validated in all clinical situations. eGFR's persistently <60 mL/min signify possible Chronic Kidney Disease.    Anion gap 10 5 - 15    Imaging / Studies: No results found.  Medications / Allergies:  Scheduled Meds: . enoxaparin (LOVENOX) injection  40 mg Subcutaneous Q24H  . feeding supplement (ENSURE ENLIVE)  237 mL Oral Q24H  . imipenem-cilastatin  500 mg Intravenous Q8H  . mupirocin ointment  1 application Nasal BID   Continuous Infusions:  PRN Meds:.acetaminophen **OR** acetaminophen, HYDROcodone-acetaminophen, HYDROmorphone (DILAUDID) injection, ondansetron **OR** ondansetron (ZOFRAN) IV  Antibiotics: Anti-infectives    Start     Dose/Rate Route Frequency Ordered Stop   02/11/16 0900  imipenem-cilastatin (PRIMAXIN) 500 mg in sodium chloride 0.9 % 100 mL IVPB     500 mg 200 mL/hr over 30 Minutes Intravenous Every 8 hours 02/11/16 1610          Assessment/Plan: s/p robotic converted to open radical cystectomy with pelvic exenteration, hysterectomy, bil salingo-oophorectomy, total urethrectomy, pelvic lymphadenectomy, colon conduit urinary diversion by Dr. Tresa Moore and a lysis of adhesions, sigmoid colectomy with resection of prior anastomosis, end colostomy--Dr. Dalbert Batman and Gross 01/27/16   Superficially necrotic stoma-it is sloughing off and now pink at 5/6 oclock.  Functioning.  No indications for revision.  Routine ostomy care.   anastomotic leak-s/p IR perc drain.  Cultures yielded e coli, atbx per primary team.    Erby Pian, ANP-BC [facility] Pager [phone](7A-4:30P)   02/15/2016 8:00 AM

## 2016-02-15 NOTE — Progress Notes (Signed)
Patient ID: [name], female   DOB: [birth year], 70 y.o.   MRN: [id number]    Referring Physician(s): [facility]/[place]  Supervising Physician: Dr. Maryclare Bean  Chief Complaint: Left retroperitoneal abscess/urinoma   Subjective: Pt feeling a little better this am; denies N/V ; has some soreness at LLQ drain Urology plans/recs noted.   Allergies: Ciprofloxacin and Penicillins  Medications:  Current facility-administered medications:  .  acetaminophen (TYLENOL) tablet 650 mg, 650 mg, Oral, Q6H PRN **OR** acetaminophen (TYLENOL) suppository 650 mg, 650 mg, Rectal, Q6H PRN, Emina Riebock, NP .  enoxaparin (LOVENOX) injection 40 mg, 40 mg, Subcutaneous, Q24H, Emina Riebock, NP, 40 mg at 02/14/16 1047 .  feeding supplement (ENSURE ENLIVE) (ENSURE ENLIVE) liquid 237 mL, 237 mL, Oral, Q24H, Satira Anis Ward, RD, 237 mL at 02/14/16 2105 .  HYDROcodone-acetaminophen (NORCO/VICODIN) 5-325 MG per tablet 1-2 tablet, 1-2 tablet, Oral, Q6H PRN, Raynelle Bring, MD .  HYDROmorphone (DILAUDID) injection 1 mg, 1 mg, Intravenous, Q2H PRN, Emina Riebock, NP, 1 mg at 02/15/16 0817 .  imipenem-cilastatin (PRIMAXIN) 500 mg in sodium chloride 0.9 % 100 mL IVPB, 500 mg, Intravenous, Q8H, Excell Seltzer, MD, 500 mg at 02/15/16 0811 .  mupirocin ointment (BACTROBAN) 2 % 1 application, 1 application, Nasal, BID, Emina Riebock, NP, 1 application at 54/00/86 1024 .  ondansetron (ZOFRAN-ODT) disintegrating tablet 4 mg, 4 mg, Oral, Q6H PRN **OR** ondansetron (ZOFRAN) injection 4 mg, 4 mg, Intravenous, Q6H PRN, Emina Riebock, NP    Vital Signs: BP 152/61 mmHg  Pulse 86  Temp(Src) 98.6 F (37 C) (Oral)  Resp 16  Ht '5\' 5"'$  (1.651 m)  Wt 145 lb (65.772 kg)  BMI 24.13 kg/m2  SpO2 97%  Physical Exam awake/alert; LLQ drain intact, insertion site ok, mildly tender; output  turbid , beige fluid;   Imaging: No results found.  Labs:  CBC:  Recent Labs  02/11/16 0431 02/12/16 0455 02/14/16 0536  02/15/16 0427  WBC 12.0* 8.3 6.6 7.0  HGB 8.4* 7.8* 9.3* 8.6*  HCT 25.8* 24.4* 28.9* 27.3*  PLT 709* 611* 562* 490*    COAGS:  Recent Labs  02/11/16 0815  INR 1.25    BMP:  Recent Labs  02/11/16 0431 02/11/16 2339 02/14/16 0536 02/15/16 0427  NA 137 138 134* 138  K 3.8 3.6 3.6 3.6  CL 105 107 98* 100*  CO2 '22 25 27 28  '$ GLUCOSE 148* 165* 133* 129*  BUN '14 11 11 11  '$ CALCIUM 8.0* 7.9* 8.4* 8.3*  CREATININE 0.51 0.63 0.53 0.50  GFRNONAA >60 >60 >60 >60  GFRAA >60 >60 >60 >60    LIVER FUNCTION TESTS:  Recent Labs  01/26/16 1356 02/10/16 1342  BILITOT 1.0 0.9  AST 31 106*  ALT 29 154*  ALKPHOS 58 104  PROT 6.6 6.5  ALBUMIN 4.4 2.4*    Assessment and Plan: S/p drainage of large left RP urinoma/abscess 2/23; AF; left RP fluid cx's- e coli; AF Urology plans/recs noted. Will hold off on PCNs for now IR following and available for (B)PCNs if ultimately needed.      Electronically Signed: Ascencion Dike 02/15/2016, 1:56 PM   I spent a total of 15 minutes at the the patient's bedside AND on the patient's hospital floor or unit, greater than 50% of which was counseling/coordinating care for left pelvic drain, bilat perc nephrostomy tube placements

## 2016-02-15 NOTE — Clinical Social Work Note (Signed)
Clinical Social Work Assessment  Patient Details  Name: [name] MRN: [id number] Date of Birth: [birth year]  Date of referral:  02/15/16               Reason for consult:  Discharge Planning, Facility Placement                Permission sought to share information with:  Chartered certified accountant granted to share information::  Yes, Verbal Permission Granted  Name::        Agency::     Relationship::     Contact Information:     Housing/Transportation Living arrangements for the past 2 months:  Single Family Home Source of Information:  Patient Patient Interpreter Needed:  None Criminal Activity/Legal Involvement Pertinent to Current Situation/Hospitalization:  No - Comment as needed Significant Relationships:  Spouse, Other Family Members Lives with:  Spouse Do you feel safe going back to the place where you live?   (SNF may be needed.) Need for family participation in patient care:  No (Coment)  Care giving concerns:  Pt's care cannot be managed at home following hospital d/c.   Social Worker assessment / plan:  Pt hospitalized on 02/10/16 with abdominal pain, necrotic ostomy. CSW met with pt to assist with d/c planning. PT has recommended SNF placement at d/c. Pt is considering this option. SNF search has been initiated and bed offers are pending. CSW will continue to follow to assist with d/c planning needs.  Employment status:  Retired Nurse, adult PT Recommendations:  [place] / Referral to community resources:  [place]  Patient/Family's Response to care:  Pt feels ST Rehab may be needed at d/c.  Patient/Family's Understanding of and Emotional Response to Diagnosis, Current Treatment, and Prognosis: Pt is aware of her medical status. " I would rather go home, if I can manage. If not, I'll need a rehab center. " Support provided.  Emotional Assessment Appearance:  Appears  stated age Attitude/Demeanor/Rapport:  Other (cooperative) Affect (typically observed):  Calm, Appropriate, Pleasant Orientation:  Oriented to Self, Oriented to Place, Oriented to  Time, Oriented to Situation Alcohol / Substance use:  Not Applicable Psych involvement (Current and /or in the community):  No (Comment)  Discharge Needs  Concerns to be addressed:  Discharge Planning Concerns Readmission within the last 30 days:  Yes Current discharge risk:  None Barriers to Discharge:  No Barriers Identified   Luretha Rued, [place] 02/15/2016, 2:44 PM

## 2016-02-16 LAB — ANAEROBIC CULTURE

## 2016-02-16 MED ORDER — HYDROCODONE-ACETAMINOPHEN 5-325 MG PO TABS
1.0000 | ORAL_TABLET | ORAL | Status: DC | PRN
  Administered 2016-02-16: 2 via ORAL
  Administered 2016-02-17: 1 via ORAL
  Administered 2016-02-17: 2 via ORAL
  Administered 2016-02-17 – 2016-02-21 (×8): 1 via ORAL
  Filled 2016-02-16 (×3): qty 1
  Filled 2016-02-16: qty 2
  Filled 2016-02-16 (×4): qty 1
  Filled 2016-02-16: qty 2
  Filled 2016-02-16 (×2): qty 1

## 2016-02-16 NOTE — Care Management Important Message (Signed)
Important Message  Patient Details Important Message  Patient Details IM Letter given to Suzanne/Case Manager to present to Patient Name: [name] MRN: [id number] Date of Birth: 02/06/1946   Medicare Important Message Given:  Yes    Camillo Flaming 02/16/2016, 1:47 PM Name: [name] MRN: [id number] Date of Birth: 1946/10/14   Medicare Important Message Given:  Yes    Camillo Flaming 02/16/2016, 1:47 [place] Message  Patient Details  Name: [name] MRN: [id number] Date of Birth: 11/10/1946   Medicare Important Message Given:  Yes    Camillo Flaming 02/16/2016, 1:46 PM

## 2016-02-16 NOTE — Progress Notes (Signed)
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  PROCEDURES: 1. Cystoscopy with indocyanine green dye injection for sentinel  lymphangiography. 2. Robotic converted to open radical cystectomy with pelvic  exenteration including hysterectomy with bilateral salpingo-  oophorectomy, total urethrectomy, bilateral pelvic lymphadenectomy. 3. Colon conduit urinary diversion. 4. Extensive laparoscopic adhesiolysis. 5. Partial colectomy with end colostomy as performed by General  Surgery team, Dr. Dalbert Batman and Dr. Johney Maine as per separate dictated  operative note.      Plan:   The patient somewhat discouraged but trying to stay focus.  Eating better.  Walking more.  Colostomy was some mild necrosis but functioning well.  Would hold off on any surgery.  Risk of stricture in the future, but can be dealt with dilation or revision later.  Not a good time to come back in the operating room.  Risk of collateral damage and fistula formation high.  She would like to avoid surgery as well.  Significant high-volume leak at ileal conduit.  Trying to control with better conduit drainage.  May need percutaneous nephrostomies as backup.  Try to avoid OR revisional surgery unless all other options fail.  Hopefully not too likely.    Patient Active Problem List   Diagnosis Date Noted  . Protein-calorie malnutrition, severe 02/15/2016  . Paraureteric urinoma   . Intra-abdominal fluid collection   . Urinoma 02/10/2016  . Bladder cancer s/p cystectomy/urostomy/TAH/colectomy/colostomy 01/27/2016 01/27/2016  . 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 Bladder cancer ([place])     Bladder and Colon 2014  . Colon cancer [facility])     Past Surgical History  Procedure Laterality Date  . Colon surgery  November 2014  . Robot assisted laparoscopic complete cystect ileal conduit N/A 01/27/2016    Procedure: XI ROBOTIC ASSISTED LAPAROSCOPIC COMPLETE CYSTECT ILEAL CONDUIT, HYSTERECTOMY, BILATERAL OOPHORECTOMY,  CYSTOSCOPY WITH INDOCYANINE GREEN DYE CONVERTED TO OPEN;  Surgeon: Alexis Frock, MD;  Location: [facility];  Service: Urology;  Laterality: N/A;  . Lymphadenectomy N/A 01/27/2016    Procedure: LYMPHADENECTOMY;  Surgeon: Alexis Frock, MD;  Location: [facility];  Service: Urology;  Laterality: N/A;  . Lymphadenectomy  01/27/2016    Procedure: LYMPHADENECTOMY;  Surgeon: Fanny Skates, MD;  Location: [facility];  Service: General;;  . Laparoscopic lysis of adhesions  01/27/2016    Procedure: EXTENSIVE  LYSIS OF ADHESIONS, REPAIR OF ADHESIONS;  Surgeon: Fanny Skates, MD;  Location: [facility];  Service: General;;  SPLENIC FLEXURE MOBILIZATION  . Colostomy closure  01/27/2016    Procedure: END COLOSTOMY;  Surgeon: Fanny Skates, MD;  Location: [facility];  Service: General;;  . Partial colectomy N/A 01/27/2016    Procedure: SIGMOID  COLECTOMY;  Surgeon: Fanny Skates, MD;  Location: [facility];  Service: General;  Laterality: N/A;    Social History   Social History  . Marital Status: Married    Spouse Name: N/A  . Number of Children: N/A  . Years of Education: N/A   Occupational History  . Not on file.   Social History Main Topics  . Smoking status: Former Smoker    Quit date: 12/19/2012  . Smokeless tobacco: Never  Used  . Alcohol Use: No  . Drug Use: No  . Sexual Activity: Yes    Birth Control/ Protection: None   Other Topics Concern  . Not on file   Social History Narrative    History reviewed. No pertinent family history.  Current Facility-Administered Medications  Medication Dose Route  Frequency Provider Last Rate Last Dose  . acetaminophen (TYLENOL) tablet 650 mg  650 mg Oral Q6H PRN Emina Riebock, NP       Or  . acetaminophen (TYLENOL) suppository 650 mg  650 mg Rectal Q6H PRN Emina Riebock, NP      . enoxaparin (LOVENOX) injection 40 mg  40 mg Subcutaneous Q24H Emina Riebock, NP   40 mg at 02/16/16 1030  . feeding supplement (ENSURE ENLIVE) (ENSURE ENLIVE) liquid 237 mL  237 mL Oral Q24H Satira Anis Ward, RD   237 mL at 02/15/16 2028  . HYDROcodone-acetaminophen (NORCO/VICODIN) 5-325 MG per tablet 1-2 tablet  1-2 tablet Oral Q6H PRN Raynelle Bring, MD   1 tablet at 02/16/16 1425  . HYDROmorphone (DILAUDID) injection 1 mg  1 mg Intravenous Q2H PRN Emina Riebock, NP   1 mg at 02/15/16 2236  . imipenem-cilastatin (PRIMAXIN) 500 mg in sodium chloride 0.9 % 100 mL IVPB  500 mg Intravenous Q8H Excell Seltzer, MD   500 mg at 02/16/16 1632  . ondansetron (ZOFRAN-ODT) disintegrating tablet 4 mg  4 mg Oral Q6H PRN Emina Riebock, NP       Or  . ondansetron (ZOFRAN) injection 4 mg  4 mg Intravenous Q6H PRN Emina Riebock, NP         Allergies  Allergen Reactions  . Ciprofloxacin Shortness Of Breath and Swelling  . Penicillins Hives    Has patient had a PCN reaction causing immediate rash, facial/tongue/throat swelling, SOB or lightheadedness with hypotension: No Has patient had a PCN reaction causing severe rash involving mucus membranes or skin necrosis: No Has patient had a PCN reaction that required hospitalization Yes Has patient had a PCN reaction occurring within the last 10 years: No If all of the above answers are "NO", then may proceed with Cephalosporin use.    BP 136/58 mmHg  Pulse 89  Temp(Src) 99 F (37.2 C) (Oral)  Resp 18  Ht '5\' 5"'$  (1.651 m)  Wt 65.772 kg (145 lb)  BMI 24.13 kg/m2  SpO2 99%  Dg Abd 1 View  01/27/2016  CLINICAL DATA:  BILATERAL double-J stent placement after cystectomy. EXAM: ABDOMEN - 1 VIEW COMPARISON:  None. FINDINGS: Cystectomy has been  performed. A drain is in the pelvis. Ureteral catheters appear to terminate in the collecting system bilaterally. They appear to drain externally on the RIGHT. IMPRESSION: As above. Electronically Signed   By: Staci Righter M.D.   On: 01/27/2016 22:26   Ct Abdomen Pelvis W Contrast  02/10/2016  CLINICAL DATA:  Intermittent abdominal pain. Left hip and groin pain. Leukocytosis. Necrosis at a colostomy stoma. Redness at a urostomy stoma. Ostomy stomal pain. Status post cystectomy, urostomy, hysterectomy, bilateral salpingo-oophorectomy, lymphadenectomy, lysis of adhesions, colostomy and sigmoid colectomy on 01/27/2016. Previous colon resection for colon cancer on 05/2012. History of bladder cancer. Previous chemotherapy completed. Additional chemotherapy and radiation therapy planned. EXAM: CT ABDOMEN AND PELVIS WITH CONTRAST TECHNIQUE: Multidetector CT imaging of the abdomen and pelvis was performed using the standard protocol following bolus administration of intravenous contrast. CONTRAST:  112m OMNIPAQUE IOHEXOL 300 MG/ML  SOLN COMPARISON:  Abdomen radiograph dated 01/27/2016 and abdomen and pelvis CT  dated 11/06/2015. FINDINGS: Lower chest: Interval mild right lower lobe atelectasis and minimal left lower lobe atelectasis. No pleural fluid. Hepatobiliary: Normal appearing liver and gallbladder. Pancreas: No mass, inflammatory changes, or other significant abnormality. Spleen: Within normal limits in size and appearance. Adrenals/Urinary Tract: Mild dilatation of both renal collecting systems. Bilateral of ureteral stent high are views are extending out the patient's urostomy, into the urostomy bag. Surgically absent urinary bladder. Normal appearing adrenal glands. Stomach/Bowel: No gastric or bowel dilatation. The distal transverse colon is exiting a left lower quadrant ostomy. The left colon and majority of the sigmoid colon is surgically absent. There is a surgical staple line at the inferior aspect of the  cecum with the remainder of the sigmoid colon in contact with that portion of the cecum. These are possibly anastomosed. Vascular/Lymphatic: Atheromatous arterial calcifications. No enlarged lymph nodes. Reproductive: Surgically absent uterus and ovaries. Other: There is a large intraperitoneal and retroperitoneal fluid collection in the mid and lower abdomen and upper pelvis on the left. There is also multiple ocular saw of extraluminal air in that region and in the anterior abdomen. This fluid collection measures 14.7 x 10.5 cm on image number 58 of series 2 and 17.0 cm in length on coronal image number 42. No surrounding membrane more enhancement. There is also adjacent mesenteric soft tissue stranding/edema. Musculoskeletal: Lumbar and lower thoracic spine degenerative changes. IMPRESSION: 1. Large intraperitoneal and retroperitoneal fluid collection on the left, measuring 17.0 x 14.7 x 10.5 cm. This could represent a seroma or urinoma. 2. Small amount of residual postoperative air. Bowel perforation could also produce this appearance. 3. Mild bilateral hydronephrosis. 4. Mild bilateral lower lobe atelectasis. Electronically Signed   By: Claudie Revering M.D.   On: 02/10/2016 16:36   Ct Image Guided Drainage By Percutaneous Catheter  02/11/2016  INDICATION: 70 year old female status post extensive pelvic surgery with right lower quadrant diverting urinary conduit (colonic). She has a large left retroperitoneal fluid collection concerning for postoperative abscess versus urinoma. EXAM: CT GUIDED DRAINAGE OF LEFT RETROPERITONEAL ABSCESS MEDICATIONS: The patient is currently admitted to the hospital and receiving intravenous antibiotics. The antibiotics were administered within an appropriate time frame prior to the initiation of the procedure. ANESTHESIA/SEDATION: 1.5 mg IV Versed 50 mcg IV Fentanyl Moderate Sedation Time:  31 The patient was continuously monitored during the procedure by the interventional  radiology nurse under my direct supervision. COMPLICATIONS: None immediate. TECHNIQUE: Informed written consent was obtained from the patient after a thorough discussion of the procedural risks, benefits and alternatives. All questions were addressed. Maximal Sterile Barrier Technique was utilized including caps, mask, sterile gowns, sterile gloves, sterile drape, hand hygiene and skin antiseptic. A timeout was performed prior to the initiation of the procedure. PROCEDURE: Informed written consent was obtained from the patient. A time-out procedure was performed. A planning CT scan was performed. The largest left lower retroperitoneal fluid collection is now filled with excreted contrast material. The excreted contrast can be seen tracking directly to the right ureterocolonic anastomosis. These findings are consistent with a urinary leak. A suitable skin entry site was selected and marked. Local anesthesia was attained by infiltration with 1% lidocaine. A small dermatotomy was made. Using intermittent CT fluoroscopic guidance, an 18 gauge trocar needle was advanced to the left lower quadrant abdominal wall and into the complex fluid collection. An Amplatz wire was then advanced in a cephalad direction. The needle was removed and the tract dilated to 12 [place]. A Cook 14 [place] biliary drain  with multiple sideholes was then advanced over the wire and formed. Aspiration yields approximately 900 mL foul-smelling frankly purulent fluid. A sample was sent for Gram stain and culture. Post drain placement imaging demonstrates near-total resolution of the fluid and contrast collection. The sideholes of the biliary drainage catheter span the expected location of the urine leak. The drain was fixed in place with 0 Prolene suture and an adhesive fixation device. A was connected to gravity bag drainage. Overall, the patient tolerated the procedure well. FINDINGS: Large left retroperitoneal urinoma secondary to leak at the right  ureterocolonic anastomosis. IMPRESSION: 1. Imaging findings consistent with leak at the right ureterocolonic anastomosis with large left retroperitoneal urinoma. 2. Successful placement of a multi side-hole 14 French biliary drainage catheter with aspiration of 900 mL foul-smelling purulent fluid. A sample was sent for Gram stain and culture. Signed, Criselda Peaches, MD [facility] Radiology Electronically Signed   By: Jacqulynn Cadet M.D.   On: 02/11/2016 14:47    Note: This dictation was prepared with Dragon/digital dictation along with [employer]. Any transcriptional errors that result from this process are unintentional.

## 2016-02-16 NOTE — Progress Notes (Signed)
Subjective:  1 - Stage 4 Bladder Cancer - s/p open cystectomy / TAH+BSO / colostomy / colon conduit 01/27/2016 in combined Urol and Gen surg procedure. Path [id number].   2 - Urinoma / Urine Leak - new large urinoma on admission CT 2/23 with aparant leak from butt end of colon conduit / Rt ureteral anastamosis. Some dilation of butt end of conduit, stents in adequate position. 71F fole placed across distal conduit (past abd fascia) 2/27 but still all UOP via urinoma drain. Urinoma drain clamped 2/28 AM.   3 - Bacteruria - UCX 2/23 from urinoma with e. Coli / pending. [place] 2/22 NGTD. Now on primaxin  4 - Dusky Colostomy - dusky colostomy noted by [name]. NO clinical sequelae nausea / fevers / emesis or free air by intake CT 2/23.   Today "[name]" is stable. Minimal UOP via urostomy, nearly all from urinoma. No fevers, nausea / emesis. Her spirits remain remarkably good.   Objective: Vital signs in last 24 hours: Temp:  [98.3 F (36.8 C)-98.5 F (36.9 C)] 98.3 F (36.8 C) (02/28 0642) Pulse Rate:  [74-83] 80 (02/28 0642) Resp:  [16-17] 16 (02/28 0642) BP: (138-149)/(52-64) 140/64 mmHg (02/28 0642) SpO2:  [95 %-97 %] 95 % (02/28 0642) Last BM Date: 02/14/16  Intake/Output from previous day: 02/27 0701 - 02/28 0700 In: 19 [P.O.:360; IV Piggyback:300] Out: 1610 [Drains:1610] Intake/Output this shift:   NAD, AOx3 Non-labored breathing on room air RLQ Urostomy pink / patent with stents and large bore conduit catheter in good position, scant urine output. LLQ colosomty with superficial epithelial necrosis but patent of gas / stool Midline incisoin c/d/i LLQ Urinoma drain with copious urine output, this was crimped / clamped with rubber band. SCD's in place, no c/c/e  Lab Results:   Recent Labs  02/14/16 0536 02/15/16 0427  WBC 6.6 7.0  HGB 9.3* 8.6*  HCT 28.9* 27.3*  PLT 562* 490*   BMET  Recent Labs  02/14/16 0536 02/15/16 0427  NA 134* 138  K 3.6 3.6  CL 98* 100*  CO2 27  28  GLUCOSE 133* 129*  BUN 11 11  CREATININE 0.53 0.50  CALCIUM 8.4* 8.3*   PT/INR No results for input(s): LABPROT, INR in the last 72 hours. ABG No results for input(s): PHART, HCO3 in the last 72 hours.  Invalid input(s): PCO2, PO2  Studies/Results: No results found.  Anti-infectives: Anti-infectives    Start     Dose/Rate Route Frequency Ordered Stop   02/11/16 0900  imipenem-cilastatin (PRIMAXIN) 500 mg in sodium chloride 0.9 % 100 mL IVPB     500 mg 200 mL/hr over 30 Minutes Intravenous Every 8 hours 02/11/16 6160        Assessment/Plan:  1 - Stage 4 Bladder Cancer - may be candidate for chemo eventually, no furhter cancer-directed therapy in house.   2 - Urinoma / Urine Leak - No improvement with catheter across conduit fascia. Will clamp urinoma tube with goal of creating path of leas resistance antegrade through conduit / stoma. RN and pt aware. WIll unclamp in 12-24 hrs if no improvement or if new fever / sig flank pain / malaise.   3 - Bacteruria - will narrow ABX coverage once CX final, continue primaxin for now.   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   Remain in house. COntinue reg diet.   Tyrena Gohr,  Cashawn Yanko 02/16/2016

## 2016-02-16 NOTE — Progress Notes (Signed)
Patient ID: [name], female   DOB: 10/10/1946, 70 y.o.   MRN: [id number]    Referring Physician(s): CCS/Manny  Supervising Physician: Sandi Mariscal  Chief Complaint: Left retroperitoneal abscess/urinoma   Subjective:  Pt states she is feeling better; still has some lower abd soreness; denies N/V; has ambulated  Allergies: Ciprofloxacin and Penicillins  Medications: Prior to Admission medications   Medication Sig Start Date End Date Taking? Authorizing Provider  Docusate Calcium (STOOL SOFTENER PO) Take 1 capsule by mouth daily as needed (constipation.).   Yes Historical Provider, MD  isoniazid (NYDRAZID) 300 MG tablet Take 300 mg by mouth daily.   Yes Historical Provider, MD  ketorolac (TORADOL) 10 MG tablet Take 1 tablet (10 mg total) by mouth every 8 (eight) hours as needed for moderate pain. Post-operatively 02/05/16  Yes Alexis Frock, MD  oxyCODONE-acetaminophen (PERCOCET/ROXICET) 5-325 MG tablet Take 1-2 tablets by mouth every 6 (six) hours as needed for severe pain. Post-operatively 02/05/16  Yes Alexis Frock, MD     Vital Signs: BP 140/64 mmHg  Pulse 80  Temp(Src) 98.3 F (36.8 C) (Oral)  Resp 16  Ht '5\' 5"'$  (1.651 m)  Wt 145 lb (65.772 kg)  BMI 24.13 kg/m2  SpO2 95%  Physical Exam left RP drain clamped by urology today; insertion site ok, mildly tender; prev output 1.6 liters, slighty turbid but clearer urine; slightly increased output noted in urostomy  Imaging: No results found.  Labs:  CBC:  Recent Labs  02/11/16 0431 02/12/16 0455 02/14/16 0536 02/15/16 0427  WBC 12.0* 8.3 6.6 7.0  HGB 8.4* 7.8* 9.3* 8.6*  HCT 25.8* 24.4* 28.9* 27.3*  PLT 709* 611* 562* 490*    COAGS:  Recent Labs  02/11/16 0815  INR 1.25    BMP:  Recent Labs  02/11/16 0431 02/11/16 2339 02/14/16 0536 02/15/16 0427  NA 137 138 134* 138  K 3.8 3.6 3.6 3.6  CL 105 107 98* 100*  CO2 '22 25 27 28  '$ GLUCOSE 148* 165* 133* 129*  BUN '14 11 11 11  '$ CALCIUM  8.0* 7.9* 8.4* 8.3*  CREATININE 0.51 0.63 0.53 0.50  GFRNONAA >60 >60 >60 >60  GFRAA >60 >60 >60 >60    LIVER FUNCTION TESTS:  Recent Labs  01/26/16 1356 02/10/16 1342  BILITOT 1.0 0.9  AST 31 106*  ALT 29 154*  ALKPHOS 58 104  PROT 6.6 6.5  ALBUMIN 4.4 2.4*    Assessment and Plan: S/p drainage of large left RP urinoma/abscess 2/23; AF; CREAT 0.50, WBC 7.0, HGB 8.6; left RP fluid cx's- e coli, aerococcus urinae resistant to cipro; bilat PCN's on hold for now per urology-  urinoma drain clamped by them this am in attempt to increase urostomy output   Electronically Signed: D. Rowe Robert 02/16/2016, 10:21 AM   I spent a total of 15 minutes at the the patient's bedside AND on the patient's hospital floor or unit, greater than 50% of which was counseling/coordinating care for left retroperitoneal drain

## 2016-02-17 NOTE — Progress Notes (Signed)
Occupational Therapy Treatment Patient Details Name: [name] MRN: [id number] DOB: [birth year] Today's Date: 02/17/2016    History of present illness This 70 y.o. female admitted with c/o pain to stoma shooting into her back.  Dx:  with uroma with  apparent anastomotic leak of at least the Rt ueretral anastomosis.  Percutaneous drain placed.  PMH includes:  Bladder CA with ercent radical cystectomy with pelvic exenteration, hysterectomy, bil. salingo-oophorectomy, pelvic lymphadenectomy, colon conduit urinary diversion and lysis of adhesions, sigmoid colectomy and end colostomy 01/27/16.    OT comments  Pt reports decreased pain at rest and during ADL's today standing at sink and sitting up in recliner. Cont to fatigue easily due to deconditioning and decreased endurance. Cont with OT poc/acute care goals to assist in maximizing independence with ADL's and functional mobility related to ADL's.   Follow Up Recommendations  SNF;Supervision/Assistance - 24 hour    Equipment Recommendations  None recommended by OT    Recommendations for Other Services      Precautions / Restrictions Precautions Precautions: Fall Precaution Comments: colostomy, urostomy , drain on L  Restrictions Weight Bearing Restrictions: No       Mobility Bed Mobility Overal bed mobility: Needs Assistance Bed Mobility: Rolling;Sidelying to Sit Rolling: Supervision Sidelying to sit: Supervision       General bed mobility comments: HOB raised, pt uses bed rails to self assist, encouraged to roll, protect the abdomen  Transfers Overall transfer level: Needs assistance Equipment used: Rolling walker (2 wheeled) Transfers: Sit to/from [facility] Sit to Stand: Supervision Stand pivot transfers: Min guard       General transfer comment: Pt did not require vc's for RW/hand placement today during ADL's.    Balance Overall balance assessment: Needs assistance Sitting-balance support: Feet  supported Sitting balance-Leahy Scale: Good     Standing balance support: During functional activity Standing balance-Leahy Scale: Fair Standing balance comment: Pt tends to lean forward on counter top when fatigues during ADL's                   ADL Overall ADL's : Needs assistance/impaired Eating/Feeding: Independent   Grooming: Wash/dry hands;Oral care;Wash/dry face;Brushing hair;Standing;Set up;Supervision/safety   Upper Body Bathing: Set up;Supervision/ safety;Standing Upper Body Bathing Details (indicate cue type and reason): Mod I sitting w/ set up Lower Body Bathing: Minimal assistance;Sit to/from stand;Set up   Upper Body Dressing : Set up;Sitting   Lower Body Dressing: Sit to/from stand;Moderate assistance Lower Body Dressing Details (indicate cue type and reason): Pt unable to access LEs             Functional mobility during ADLs: Rolling walker;Supervision/safety;Min guard (Supervision to min guard assist ) General ADL Comments: Pt participated in ADL retraining session today to include grooming and bathing UB standing at sink, sitting in recliner for LB ADL's and Min A to wash back. Pt cont to fatigue quickly and cont to be unable to access bilateral LE's secondary to pain. Progressing towards POC/goals.      Vision  Wears glasses for reading. No change from baseline.                   Perception     Praxis      Cognition   Behavior During Therapy: WFL for tasks assessed/performed Overall Cognitive Status: Within Functional Limits for tasks assessed                       Extremity/Trunk Assessment  Exercises     Shoulder Instructions       General Comments      Pertinent Vitals/ Pain       Pain Assessment: 0-10 Pain Score: 2  Pain Location: abdomen Pain Descriptors / Indicators: Tender;Aching Pain Intervention(s): Monitored during session;Limited activity within patient's tolerance  Home Living                                           Prior Functioning/Environment              Frequency Min 2X/week     Progress Toward Goals  OT Goals(current goals can now be found in the care plan section)  Progress towards OT goals: Progressing toward goals  Acute Rehab OT Goals Patient Stated Goal: Go home if possible, feel better  Plan Discharge plan remains appropriate    Co-evaluation                 End of Session Equipment Utilized During Treatment: Rolling walker;Gait belt   Activity Tolerance Patient tolerated treatment well   Patient Left in chair;with call bell/phone within reach;with chair alarm set   Nurse Communication Mobility status;Other (comment) (ADL's completed, bed linens ready to be changed)        Time: 8325-4982 OT Time Calculation (min): 33 min  Charges: OT General Charges $OT Visit: 1 Procedure OT Treatments $Self Care/Home Management : 23-37 mins  [place], Amy Beth Dixon, OTR/L 02/17/2016, 9:23 AM

## 2016-02-17 NOTE — Progress Notes (Signed)
Right urostomy noted to have cloudy output earlier in shift.  When pt got up to walk, noticed blood in urostomy bag.  Drained bag.  Appears there were clots mixed in.  After letting the urostomy drain the clots passed and the cloudy output noted earlier returned.  Will continue to monitor.  Roselind Rily

## 2016-02-17 NOTE — Care Management Note (Signed)
Case Management Note  Patient Details  Name: SHARNIKA BINNEY MRN: [id number] Date of Birth: [birth year]  Subjective/Objective:        Admitted with urinoma             Action/Plan: Discharge planning, spoke with patient and spouse at bedside. Thought she might need SNF at d/c but now feels much improved and wants to go home with [facility]. Previously active with [facility] for PT/OT/RN. Patient wants to resume these services. Contacted [facility] for referral. Awaiting final orders.   Expected Discharge Date:   (unknown)               Expected Discharge Plan:  [place]  In-House Referral:  NA  Discharge planning Services  CM Consult  Post Acute Care Choice:  Home Health, Resumption of Svcs/PTA Provider Choice offered to:  Patient  DME Arranged:  N/A DME Agency:  NA  HH Arranged:  RN, PT, OT HH Agency:  [place]  Status of Service:  Completed, signed off  Medicare Important Message Given:  Yes Date Medicare IM Given:    Medicare IM give by:    Date Additional Medicare IM Given:    Additional Medicare Important Message give by:     If discussed at [place] of Stay Meetings, dates discussed:    Additional Comments:  Guadalupe Maple, RN 02/17/2016, 11:41 AM

## 2016-02-17 NOTE — Progress Notes (Signed)
Physical Therapy Treatment Patient Details Name: [name] MRN: [id number] DOB: February 01, 1946 Today's Date: 02/17/2016    History of Present Illness This 70 y.o. female admitted with c/o pain to stoma shooting into her back.  Dx:  with uroma with  apparent anastomotic leak of at least the Rt ueretral anastomosis.  Percutaneous drain placed.  PMH includes:  Bladder CA with ercent radical cystectomy with pelvic exenteration, hysterectomy, bil. salingo-oophorectomy, pelvic lymphadenectomy, colon conduit urinary diversion and lysis of adhesions, sigmoid colectomy and end colostomy 01/27/16.     PT Comments    Pt able to ambulate lap of hospital floor with RW and min/guard.  When with pt, she said she didn't know if she would be going home or SNF due to awaiting results and needing to talk to MD.  SW came and spoke with pt later and she indicated she wanted and felt she could go home, so d/c rec changed to [place] and will need RW.   Follow Up Recommendations  Home health PT;Supervision - Intermittent     Equipment Recommendations  Rolling walker with 5" wheels    Recommendations for Other Services       Precautions / Restrictions Precautions Precautions: Fall Precaution Comments: colostomy, urostomy , drain on L, foley catheter Restrictions Weight Bearing Restrictions: No    Mobility  Bed Mobility Overal bed mobility: Needs Assistance Bed Mobility: Rolling;Sidelying to Sit Rolling: Supervision Sidelying to sit: Supervision       General bed mobility comments: sitting up in recliner upon arrival finishing with OT  Transfers Overall transfer level: Needs assistance Equipment used: Rolling walker (2 wheeled) Transfers: Sit to/from Stand Sit to Stand: Supervision Stand pivot transfers: Min guard       General transfer comment: Good hand placement with transitions today.  Ambulation/Gait Ambulation/Gait assistance: Min guard;Supervision Ambulation Distance (Feet): 350  Feet Assistive device: Rolling walker (2 wheeled) Gait Pattern/deviations: Step-through pattern Gait velocity: decreased   General Gait Details: decreased speed with some dyspnea last portion of gait, but pt able to continue.   Stairs            Wheelchair Mobility    Modified Rankin (Stroke Patients Only)       Balance Overall balance assessment: Needs assistance Sitting-balance support: Feet supported Sitting balance-Leahy Scale: Good     Standing balance support: During functional activity Standing balance-Leahy Scale: Fair Standing balance comment: reliance on RW for steadying and fatigue.                    Cognition Arousal/Alertness: Awake/alert Behavior During Therapy: WFL for tasks assessed/performed Overall Cognitive Status: Within Functional Limits for tasks assessed                      Exercises      General Comments General comments (skin integrity, edema, etc.): Pt verbalized she has been doing exercises in the room.      Pertinent Vitals/Pain Pain Assessment: 0-10 Pain Score: 2  Pain Location: abdomen Pain Descriptors / Indicators: Aching;Tender Pain Intervention(s): Monitored during session    Home Living                      Prior Function            PT Goals (current goals can now be found in the care plan section) Acute Rehab PT Goals Patient Stated Goal: Go home if possible, feel better PT Goal Formulation: With patient Time  For Goal Achievement: 02/28/16 Potential to Achieve Goals: Good Progress towards PT goals: Progressing toward goals    Frequency  Min 3X/week    PT Plan Discharge plan needs to be updated    Co-evaluation             End of Session   Activity Tolerance: Patient tolerated treatment well Patient left: in chair;with call bell/phone within reach;with chair alarm set     Time: 0911-0930 PT Time Calculation (min) (ACUTE ONLY): 19 min  Charges:  $Gait Training: 8-22  mins                    G Codes:      Reynaldo Rossman LUBECK 02/17/2016, 10:17 AM

## 2016-02-17 NOTE — Progress Notes (Signed)
Patient ID: [name], female   DOB: 02-11-1946, 70 y.o.   MRN: [id number]    Referring Physician(s): CCS/Borden  Supervising Physician: Arne Cleveland  Chief Complaint: Left retroperitoneal abscess/urinoma   Subjective:  Pt without acute changes; denies worsening abd pain,N/V or abnormal bleeding; has ambulated in hallway  Allergies: Ciprofloxacin and Penicillins  Medications: Prior to Admission medications   Medication Sig Start Date End Date Taking? Authorizing Provider  Docusate Calcium (STOOL SOFTENER PO) Take 1 capsule by mouth daily as needed (constipation.).   Yes Historical Provider, MD  isoniazid (NYDRAZID) 300 MG tablet Take 300 mg by mouth daily.   Yes Historical Provider, MD  ketorolac (TORADOL) 10 MG tablet Take 1 tablet (10 mg total) by mouth every 8 (eight) hours as needed for moderate pain. Post-operatively 02/05/16  Yes Alexis Frock, MD  oxyCODONE-acetaminophen (PERCOCET/ROXICET) 5-325 MG tablet Take 1-2 tablets by mouth every 6 (six) hours as needed for severe pain. Post-operatively 02/05/16  Yes Alexis Frock, MD     Vital Signs: BP 150/58 mmHg  Pulse 86  Temp(Src) 98.8 F (37.1 C) (Oral)  Resp 16  Ht '5\' 5"'$  (1.651 m)  Wt 145 lb (65.772 kg)  BMI 24.13 kg/m2  SpO2 98%  Physical Exam LLQ drain intact and clamped; insertion site mildy tender, sl erythematous; urostomy output increased- 500 cc listed in computer so far today  Imaging: No results found.  Labs:  CBC:  Recent Labs  02/11/16 0431 02/12/16 0455 02/14/16 0536 02/15/16 0427  WBC 12.0* 8.3 6.6 7.0  HGB 8.4* 7.8* 9.3* 8.6*  HCT 25.8* 24.4* 28.9* 27.3*  PLT 709* 611* 562* 490*    COAGS:  Recent Labs  02/11/16 0815  INR 1.25    BMP:  Recent Labs  02/11/16 0431 02/11/16 2339 02/14/16 0536 02/15/16 0427  NA 137 138 134* 138  K 3.8 3.6 3.6 3.6  CL 105 107 98* 100*  CO2 '22 25 27 28  '$ GLUCOSE 148* 165* 133* 129*  BUN '14 11 11 11  '$ CALCIUM 8.0* 7.9* 8.4* 8.3*    CREATININE 0.51 0.63 0.53 0.50  GFRNONAA >60 >60 >60 >60  GFRAA >60 >60 >60 >60    LIVER FUNCTION TESTS:  Recent Labs  01/26/16 1356 02/10/16 1342  BILITOT 1.0 0.9  AST 31 106*  ALT 29 154*  ALKPHOS 58 104  PROT 6.6 6.5  ALBUMIN 4.4 2.4*    Assessment and Plan: S/p drainage of large left RP urinoma/abscess 2/23; AF;no new lab data; left RP fluid cx's- e coli, aerococcus urinae resistant to cipro; bilat PCN's on hold for now per urology- urinoma drain clamped by them yesterday in attempt to increase urostomy output   Electronically Signed: D. Rowe Robert 02/17/2016, 1:47 PM   I spent a total of 15 minutes at the the patient's bedside AND on the patient's hospital floor or unit, greater than 50% of which was counseling/coordinating care for left retroperitoneal drain

## 2016-02-17 NOTE — Progress Notes (Addendum)
Pharmacy Antibiotic Note  [name] is a 70 y.o. female with bladder cancer s/p cystectomy/urostomy/TAH/colectomy/colostomy on  01/27/2016 and recently discharged from [facility] on 02/05/16.  She presented back to the hospital on 2/22 with c/o pain to stoma and shooting to the back.  CCS suspects superficial ischemia of her colostomy. CT 2/23 revealed new large urinoma w/ leak from endo of colon conduit/R ureteral anastomosis.  S/p IR drain of urinoma and abscess. Started imipenem for suspected intra-abdominal infection.  Of note, patient has PCN listed as an allergy (MD is aware) with rxn noted as hives.  Coag-negative staph 1/2 blood cultures is likely contaminant E. Coli from peritoneal cavity is only resistant to ciprofloxacin, bacteroides recovered from anaerobic culture.    Day #7 imipenem   Plan:  Imipenem 500 mg IV q8h remains appropriately dosed  Due to PCN allergy and no data in [facility] to suggest ever received cephalosporin, consider change to ertapenem for once-daily dosing w/ no need for pseudomonas coverage.   Height: '5\' 5"'$  (165.1 cm) Weight: 145 lb (65.772 kg) IBW/kg (Calculated) : 57  Temp (24hrs), Avg:99 F (37.2 C), Min:98.8 F (37.1 C), Max:99.2 F (37.3 C)   Recent Labs Lab 02/10/16 1342 02/10/16 1352 02/10/16 1353 02/11/16 0431 02/11/16 2339 02/12/16 0455 02/14/16 0536 02/15/16 0427  WBC 11.7*  --   --  12.0*  --  8.3 6.6 7.0  CREATININE 0.65 0.60  --  0.51 0.63  --  0.53 0.50  LATICACIDVEN  --   --  1.44  --   --   --   --   --     Estimated Creatinine Clearance: 59.7 mL/min (by C-G formula based on Cr of 0.5).    Allergies  Allergen Reactions  . Ciprofloxacin Shortness Of Breath and Swelling  . Penicillins Hives    Has patient had a PCN reaction causing immediate rash, facial/tongue/throat swelling, SOB or lightheadedness with hypotension: No Has patient had a PCN reaction causing severe rash involving mucus membranes or skin necrosis: No Has patient had a  PCN reaction that required hospitalization Yes Has patient had a PCN reaction occurring within the last 10 years: No If all of the above answers are "NO", then may proceed with Cephalosporin use.   Antimicrobials this admission: 2/23 primaxin>>  Levels/dose changes this admission: n/a  Microbiology results: 2/22 MRSA PCR (+) on chlx and bactroban 2/22 bcx x2: CoNS 1/2 likely contaminant 2/23 peritoneal fluid cx: e. Coli (R to cipro only), aerococcus urinae 2/23 peritoneal fluid anaerobic cx: bacteroides (B-lactamase +)  Thank you for allowing pharmacy to be a part of this patient's care.  Doreene Eland, PharmD, BCPS.   Pager: [phone] 02/17/2016 7:37 AM

## 2016-02-17 NOTE — Progress Notes (Signed)
CSW met with pt this am to assist with d/c planning. Pt is declining SNF placement at this time. Pt has decided to return home with [facility] services. RNCM has been notified and will assist with d/c planning. CSW will remain available in case plan changes and SNF is needed.  Werner Lean LCSW [phone]

## 2016-02-17 NOTE — Consult Note (Addendum)
WOC ostomy follow up: Colon Conduit Stoma type/location: RUQ colon conduit now with output. Planned pouch change tomorrow morning. Enrolled patient in [place] Start Discharge program: Yes  WOC ostomy follow up Stoma type/location: LUQ colostomy. System intact.  Planning pouch change tomorrow am. Education provided: Patient reports small appetite and is encouraged to eat several times a day, small portions that are nutrient packed (protein).  She is feeling well today and encouraged by urine output via the conduit. Enrolled patient in [place] Start Discharge program: Yes   [place] nursing team will follow, will remain available to this patient, the nursing, surgical and medical teams.   Thanks, Maudie Flakes, MSN, RN, [place], Arther Abbott  Pager# [phone]

## 2016-02-17 NOTE — Progress Notes (Signed)
Subjective:  1 - Stage 4 Bladder Cancer - s/p open cystectomy / TAH+BSO / colostomy / colon conduit 01/27/2016 in combined Urol and Gen surg procedure. Path [id number].   2 - Urinoma / Urine Leak - new large urinoma on admission CT 2/23 with aparant leak from butt end of colon conduit / Rt ureteral anastamosis. Some dilation of butt end of conduit, stents in adequate position. 38F fole placed across distal conduit (past abd fascia) 2/27 but still all UOP via urinoma drain. Urinoma drain clamped 2/28 AM and now copious output antegrade by urostomy (>800/day).   3 - Bacteruria - UCX 2/23 from urinoma with e. Coli sens primaxin (on). [place] 2/22 NGTD. 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 NO clinical sequelae nausea / fevers / emesis or free air by intake CT 2/23.   Today "Patty" is improving some. Now copious clear UOP in antegrade fahsion per urostomy with urinoma drain clamped. No increased abdominal / flank pain.   Objective: Vital signs in last 24 hours: Temp:  [98.8 F (37.1 C)-99.2 F (37.3 C)] 98.9 F (37.2 C) (03/01 1400) Pulse Rate:  [82-87] 82 (03/01 1400) Resp:  [16-18] 18 (03/01 1400) BP: (121-150)/(54-68) 124/68 mmHg (03/01 1400) SpO2:  [96 %-98 %] 96 % (03/01 1400) Last BM Date: 02/16/16  Intake/Output from previous day: 02/28 0701 - 03/01 0700 In: 36 [P.O.:720; IV Piggyback:100] Out: 801 [Urine:800; Stool:1] Intake/Output this shift: Total I/O In: -  Out: 650 [Urine:650]  General appearance: alert, cooperative and appears stated age Head: Normocephalic, without obvious abnormality, atraumatic Eyes: negative Nose: Nares normal. Septum midline. Mucosa normal. No drainage or sinus tenderness. Throat: lips, mucosa, and tongue normal; teeth and gums normal Neck: supple, symmetrical, trachea midline Back: symmetric, no curvature. ROM normal. No CVA tenderness. Resp: non-labored on room air.  Cardio: NL rate GI: soft, non-tender; bowel sounds  normal; no masses,  no organomegaly Extremities: extremities normal, atraumatic, no cyanosis or edema Pulses: 2+ and symmetric Skin: Skin color, texture, turgor normal. No rashes or lesions Lymph nodes: Cervical, supraclavicular, and axillary nodes normal. Neurologic: Grossly normal  RLQ Urostomy pink / patent now with copious clear yellow urine. LLQ Colsotomy patent of gas adn stool. Lt lateral urinoma drain clamped, no erythema / drainage at site.   Lab Results:   Recent Labs  02/15/16 0427  WBC 7.0  HGB 8.6*  HCT 27.3*  PLT 490*   BMET  Recent Labs  02/15/16 0427  NA 138  K 3.6  CL 100*  CO2 28  GLUCOSE 129*  BUN 11  CREATININE 0.50  CALCIUM 8.3*   PT/INR No results for input(s): LABPROT, INR in the last 72 hours. ABG No results for input(s): PHART, HCO3 in the last 72 hours.  Invalid input(s): PCO2, PO2  Studies/Results: No results found.  Anti-infectives: Anti-infectives    Start     Dose/Rate Route Frequency Ordered Stop   02/11/16 0900  imipenem-cilastatin (PRIMAXIN) 500 mg in sodium chloride 0.9 % 100 mL IVPB     500 mg 200 mL/hr over 30 Minutes Intravenous Every 8 hours 02/11/16 2952        Assessment/Plan:   1 - Stage 4 Bladder Cancer - may be candidate for chemo eventually, no furhter cancer-directed therapy in house.   2 - Urinoma / Urine Leak - Now with antegrade drainage with large foley across conduit fascia and clamping of urinoma drain. This is encouraging. Plan to keep urinoma drain clamped  at least 7-14 days if tolerates, then possilble loop-o-gram and remove urinoma drain at that time if no continued extrav. Marland Kitchen   3 - Bacteruria - continue primaxin for now, transition to PO bactrim stoon.   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   Remain in house. COntinue reg diet.   [facility],  Tarae Wooden 02/17/2016

## 2016-02-18 LAB — BASIC METABOLIC PANEL
ANION GAP: 8 (ref 5–15)
BUN: 13 mg/dL (ref 6–20)
CHLORIDE: 102 mmol/L (ref 101–111)
CO2: 27 mmol/L (ref 22–32)
CREATININE: 0.72 mg/dL (ref 0.44–1.00)
Calcium: 8.3 mg/dL — ABNORMAL LOW (ref 8.9–10.3)
GFR calc non Af Amer: >60 mL/min (ref >60)
GLUCOSE: 116 mg/dL — AB (ref 65–99)
Potassium: 4.3 mmol/L (ref 3.5–5.1)
Sodium: 137 mmol/L (ref 135–145)

## 2016-02-18 MED ORDER — SULFAMETHOXAZOLE-TRIMETHOPRIM 800-160 MG PO TABS
1.0000 | ORAL_TABLET | Freq: Two times a day (BID) | ORAL | Status: DC
Start: 2016-02-18 — End: 2016-02-22
  Administered 2016-02-18 – 2016-02-22 (×9): 1 via ORAL
  Filled 2016-02-18 (×10): qty 1

## 2016-02-18 MED ORDER — METRONIDAZOLE 500 MG PO TABS
500.0000 mg | ORAL_TABLET | Freq: Three times a day (TID) | ORAL | Status: DC
  Administered 2016-02-18 – 2016-02-22 (×12): 500 mg via ORAL
  Filled 2016-02-18 (×15): qty 1

## 2016-02-18 NOTE — Progress Notes (Signed)
Nutrition Follow-up  DOCUMENTATION CODES:   Severe malnutrition in context of acute illness/injury  INTERVENTION:   -Continue Ensure Enlive po daily, each supplement provides 350 kcal and 20 grams of protein -RD to continue to monitor for additional needs  NUTRITION DIAGNOSIS:   Inadequate oral intake related to poor appetite as evidenced by per patient/family report, energy intake < 75% for > 7 days, percent weight loss.  Improving.  GOAL:   Patient will meet greater than or equal to 90% of their needs  Meeting.  MONITOR:   PO intake, Supplement acceptance, Labs, Weight trends, Skin    ASSESSMENT:   [name] is a 70 y.o. female complaining of with a history of bladder cancer and is s/p ileal conduit/urostomy 01/27/2016 by Dr. Tresa Moore patient has history of colon cancer and she had significant adhesions, Dr. Johney Maine performed partial colectomy with colostomy during the same OR visit. She presents today with aching abdominal pain radiating around to her back that she rates 8/10. She had a colostomy bag and urostomy bag placed February 8th and was discharged from the hospital last Friday. S/p drainage of large left RP urinoma/abscess 2/23  Patient's PO intake has improved, eating 75-100% of all meals yesterday. She is still drinking ensure supplements as well. No further nutritional needs identified at this time.  Labs and medications reviewed.   Diet Order:  Diet regular Room service appropriate?: Yes; Fluid consistency:: Thin  Skin:  Wound (see comment) (closed incisions- abdomen and vagina )  Last BM:  3/1  Height:   Ht Readings from Last 1 Encounters:  02/10/16 '5\' 5"'$  (1.651 m)    Weight:   Wt Readings from Last 1 Encounters:  02/10/16 145 lb (65.772 kg)    Ideal Body Weight:  56.8 kg  BMI:  Body mass index is 24.13 kg/(m^2).  Estimated Nutritional Needs:   Kcal:  1600-1900  Protein:  70-85 grams  Fluid:  1.6-1.9 L  EDUCATION NEEDS:   No  education needs identified at this time  Clayton Bibles, MS, RD, LDN Pager: [phone] After Hours Pager: [phone]

## 2016-02-18 NOTE — Consult Note (Signed)
WOC ostomy follow up: Colon Conduit (Urostomy) Stoma type/location: RUQ colon conduit. 31F catheter in conduit past fascia is patent. Balloon inserted with approximately 79ms of NS; catheter is secure, but movable in the conduit. Stomal assessment/size: 1 and 1/4 inch oval stoma with 2 stents intact. Peristomal assessment: intact, erythematous. Depression from 7-11 o'clock. Treatment options for stomal/peristomal skin: two skin barrier rings Output: mucous only. Clear tea colored urine. Ostomy pouching: 1pc convex with 2 skin barrier rings. Stents and catheter are fed through aperture in pouch and coiled in pouching system. Education provided: Patient taught about mucus and urinary stomas [place] Yes (last admission)  WOC ostomy follow up Stoma type/location: LUQ colostomy Stomal assessment/size: 1 and 1/8 x 1 and 3/4 oval. Black "cap" of necrotic tissue is sloughing, mucocutaneous separation at 3 and 9 o'clock with scant  purulent drainage from these sites. Peristomal assessment: Intact, with mild erythema circumferentially Treatment options for stomal/peristomal skin: Skin barrier ring Output: flatus and a small amount of liquid stool in pouch Ostomy pouching: 1pc. Convex with skin barrier ring Education provided: Patient taught that stoma continues to slough and that when the necrotic tissue is gone, stoma is likely to be at or below skin level. [place]: Yes (last admission)   Supplies for both ostomies are in room with patterns in the event that pouching system changes are required in my absence.  WAcamponursing team will follow, and will remain available to this patient, the nursing and medical teams.  Thanks, LMaudie Flakes MSN, RN, [place] CArther Abbott Pager# [phone]

## 2016-02-18 NOTE — Progress Notes (Signed)
Patient ID: [name], female   DOB: 1946/08/22, 70 y.o.   MRN: [id number]    Referring Physician(s): CCS  Supervising Physician: Aletta Edouard  Chief Complaint: urinoma  Subjective: Patient feels well today.  No complaints.  Doing well with her drain clamped off.  Allergies: Ciprofloxacin and Penicillins  Medications: Prior to Admission medications   Medication Sig Start Date End Date Taking? Authorizing Provider  Docusate Calcium (STOOL SOFTENER PO) Take 1 capsule by mouth daily as needed (constipation.).   Yes Historical Provider, MD  isoniazid (NYDRAZID) 300 MG tablet Take 300 mg by mouth daily.   Yes Historical Provider, MD  ketorolac (TORADOL) 10 MG tablet Take 1 tablet (10 mg total) by mouth every 8 (eight) hours as needed for moderate pain. Post-operatively 02/05/16  Yes Alexis Frock, MD  oxyCODONE-acetaminophen (PERCOCET/ROXICET) 5-325 MG tablet Take 1-2 tablets by mouth every 6 (six) hours as needed for severe pain. Post-operatively 02/05/16  Yes Alexis Frock, MD    Vital Signs: BP 138/60 mmHg  Pulse 85  Temp(Src) 98.3 F (36.8 C) (Oral)  Resp 18  Ht '5\' 5"'$  (1.651 m)  Wt 145 lb (65.772 kg)  BMI 24.13 kg/m2  SpO2 98%  Physical Exam: Abd: soft, less tender, drain clamped off, urine draining through her conduit bag into a gravity bag.  Imaging: No results found.  Labs:  CBC:  Recent Labs  02/11/16 0431 02/12/16 0455 02/14/16 0536 02/15/16 0427  WBC 12.0* 8.3 6.6 7.0  HGB 8.4* 7.8* 9.3* 8.6*  HCT 25.8* 24.4* 28.9* 27.3*  PLT 709* 611* 562* 490*    COAGS:  Recent Labs  02/11/16 0815  INR 1.25    BMP:  Recent Labs  02/11/16 2339 02/14/16 0536 02/15/16 0427 02/18/16 0435  NA 138 134* 138 137  K 3.6 3.6 3.6 4.3  CL 107 98* 100* 102  CO2 '25 27 28 27  '$ GLUCOSE 165* 133* 129* 116*  BUN '11 11 11 13  '$ CALCIUM 7.9* 8.4* 8.3* 8.3*  CREATININE 0.63 0.53 0.50 0.72  GFRNONAA >60 >60 >60 >60  GFRAA >60 >60 >60 >60    LIVER FUNCTION  TESTS:  Recent Labs  01/26/16 1356 02/10/16 1342  BILITOT 1.0 0.9  AST 31 106*  ALT 29 154*  ALKPHOS 58 104  PROT 6.6 6.5  ALBUMIN 4.4 2.4*    Assessment and Plan: S/p drainage of large left RP urinoma/abscess 2/23 -plan per urology.  Leave drain clamped for 7-14 more days, then loop-a-gram and possible removal of drain after that. -good output through conduit with tube clamped. -will follow  Electronically Signed: Darnella Zeiter E 02/18/2016, 11:57 AM   I spent a total of 15 Minutes at the the patient's bedside AND on the patient's hospital floor or unit, greater than 50% of which was counseling/coordinating care for urinoma

## 2016-02-18 NOTE — Progress Notes (Signed)
Subjective:  1 - Stage 4 Bladder Cancer - s/p open cystectomy / TAH+BSO / colostomy / colon conduit 01/27/2016 in combined Urol and Gen surg procedure. Path pT4N2.   2 - Urinoma / Urine Leak - new large urinoma on admission CT 2/23 with aparant leak from butt end of colon conduit / Rt ureteral anastamosis. Some dilation of butt end of conduit, stents in adequate position. 40F fole placed across distal conduit (past abd fascia) 2/27 but still all UOP via urinoma drain. Urinoma drain clamped 2/28 and now copious output antegrade by urostomy (>2L/day).   3 - Bacteruria - UCX 2/23 from urinoma with e. Coli sens primaxin (on). [place] 2/22 NGTD. Initially on Primzxin IV, transitioned to PO Bactrim 3/2 as afebrile.   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 NO clinical sequelae nausea / fevers / emesis or free air by intake CT 2/23.   Today "[name]" is stable. No flank / abd pain. >2L UOP antegrade urostomy past 24 hrs.   Objective: Vital signs in last 24 hours: Temp:  [98.3 F (36.8 C)-98.9 F (37.2 C)] 98.3 F (36.8 C) (03/02 0450) Pulse Rate:  [82-85] 85 (03/02 0450) Resp:  [16-18] 18 (03/02 0450) BP: (124-140)/(58-68) 138/60 mmHg (03/02 0450) SpO2:  [96 %-98 %] 98 % (03/02 0450) Last BM Date: 02/17/16  Intake/Output from previous day: 03/01 0701 - 03/02 0700 In: 1140 [P.O.:840; IV Piggyback:300] Out: 3051 [Urine:3050; Stool:1] Intake/Output this shift: Total I/O In: -  Out: 500 [Urine:500]  General appearance: alert, cooperative and appears stated age Head: Normocephalic, without obvious abnormality, atraumatic Eyes: negative Nose: Nares normal. Septum midline. Mucosa normal. No drainage or sinus tenderness. Throat: lips, mucosa, and tongue normal; teeth and gums normal Neck: supple, symmetrical, trachea midline Back: symmetric, no curvature. ROM normal. No CVA tenderness. Resp: non-labored on room air.  Cardio: NL rate GI: soft, non-tender; bowel sounds normal; no  masses,  no organomegaly Extremities: extremities normal, atraumatic, no cyanosis or edema Pulses: 2+ and symmetric Skin: Skin color, texture, turgor normal. No rashes or lesions Lymph nodes: Cervical, supraclavicular, and axillary nodes normal. Neurologic: Grossly normal  RLQ Urostomy pink / patent now with copious clear yellow urine. LLQ Colsotomy patent of gas adn stool. Lt lateral urinoma drain clamped, no erythema / drainage at site.   Lab Results:  No results for input(s): WBC, HGB, HCT, PLT in the last 72 hours. BMET  Recent Labs  02/18/16 0435  NA 137  K 4.3  CL 102  CO2 27  GLUCOSE 116*  BUN 13  CREATININE 0.72  CALCIUM 8.3*   PT/INR No results for input(s): LABPROT, INR in the last 72 hours. ABG No results for input(s): PHART, HCO3 in the last 72 hours.  Invalid input(s): PCO2, PO2  Studies/Results: No results found.  Anti-infectives: Anti-infectives    Start     Dose/Rate Route Frequency Ordered Stop   02/18/16 1200  sulfamethoxazole-trimethoprim (BACTRIM DS,SEPTRA DS) 800-160 MG per tablet 1 tablet     1 tablet Oral Every 12 hours 02/18/16 1148     02/11/16 0900  imipenem-cilastatin (PRIMAXIN) 500 mg in sodium chloride 0.9 % 100 mL IVPB     500 mg 200 mL/hr over 30 Minutes Intravenous Every 8 hours 02/11/16 7353        Assessment/Plan:  1 - Stage 4 Bladder Cancer - may be candidate for chemo eventually, no furhter cancer-directed therapy in house.   2 - Urinoma / Urine Leak - Now with large  volume antegrade drainage with large foley across conduit fascia and clamping of urinoma drain. This remains encouraging. Plan to keep urinoma drain clamped at least 7-14 days if tolerates, then possilble loop-o-gram and remove urinoma drain at that time if no continued extrav. Marland Kitchen   3 - Bacteruria - now afebrile, transition to PO Bactrim with plan for 2week total course.   4 - Dusky Colostomy - Gen surg following, agree with non-operative management as functioning  well, but may revise in combined GU-Gen Surrg procedure if compelling indications for re-exploration.   Remain in house. COntinue reg diet.   [address] [facility], Hazely Sealey 02/18/2016

## 2016-02-18 NOTE — Progress Notes (Signed)
Physical Therapy Treatment Patient Details Name: [name] MRN: [id number] DOB: 17-Dec-1946 Today's Date: 02/18/2016    History of Present Illness This 70 y.o. female admitted with c/o pain to stoma shooting into her back.  Dx:  with uroma with  apparent anastomotic leak of at least the Rt ueretral anastomosis.  Percutaneous drain placed.  PMH includes:  Bladder CA with ercent radical cystectomy with pelvic exenteration, hysterectomy, bil. salingo-oophorectomy, pelvic lymphadenectomy, colon conduit urinary diversion and lysis of adhesions, sigmoid colectomy and end colostomy 01/27/16.     PT Comments    Pt is progressing well with mobility, she ambulated 400' with RW without loss of balance. She has mild dyspnea with ambulation, SaO2 98% on RA. Pt attributes dyspnea to deconditioning from recent illness.   Follow Up Recommendations  Home health PT;Supervision - Intermittent     Equipment Recommendations  Rolling walker with 5" wheels    Recommendations for Other Services       Precautions / Restrictions Precautions Precautions: Fall Precaution Comments: colostomy, urostomy , drain on L  Restrictions Weight Bearing Restrictions: No    Mobility  Bed Mobility Overal bed mobility: Needs Assistance Bed Mobility: Rolling;Sidelying to Sit Rolling: Supervision Sidelying to sit: Supervision       General bed mobility comments: HOB raised, pt uses bed rails to self assist, encouraged to roll, protect the abdomen  Transfers Overall transfer level: Needs assistance Equipment used: Rolling walker (2 wheeled) Transfers: Sit to/from Stand Sit to Stand: Supervision         General transfer comment: VCs hand placement  Ambulation/Gait Ambulation/Gait assistance: Modified independent (Device/Increase time) Ambulation Distance (Feet): 400 Feet Assistive device: Rolling walker (2 wheeled) Gait Pattern/deviations: Decreased stride length   Gait velocity interpretation: at or  above normal speed for age/gender General Gait Details: 2/4 dyspnea with ambulation (pt stated, "that's because I've been in bed for so long") SaO2 98% on RA walking, no LOB   Stairs            Wheelchair Mobility    Modified Rankin (Stroke Patients Only)       Balance     Sitting balance-Leahy Scale: Good       Standing balance-Leahy Scale: Fair                      Cognition Arousal/Alertness: Awake/alert Behavior During Therapy: WFL for tasks assessed/performed Overall Cognitive Status: Within Functional Limits for tasks assessed                      Exercises      General Comments        Pertinent Vitals/Pain Pain Assessment: No/denies pain    Home Living                      Prior Function            PT Goals (current goals can now be found in the care plan section) Acute Rehab PT Goals Patient Stated Goal: to go out with friends and play cards PT Goal Formulation: With patient Time For Goal Achievement: 02/28/16 Potential to Achieve Goals: Good Progress towards PT goals: Progressing toward goals    Frequency  Min 3X/week    PT Plan Current plan remains appropriate    Co-evaluation             End of Session   Activity Tolerance: Patient tolerated treatment well Patient left: with call  bell/phone within reach;in bed     Time: 3354-5625 PT Time Calculation (min) (ACUTE ONLY): 17 min  Charges:  $Gait Training: 8-22 mins                    G Codes:      Philomena Doheny 02/18/2016, 1:35 PM [phone]

## 2016-02-19 NOTE — Progress Notes (Signed)
Subjective:  1 - Stage 4 Bladder Cancer - s/p open cystectomy / TAH+BSO / colostomy / colon conduit 01/27/2016 in combined Urol and Gen surg procedure. Path pT4N2.   2 - Urinoma / Urine Leak - new large urinoma on admission CT 2/23 with aparant leak from butt end of colon conduit / Rt ureteral anastamosis. Some dilation of butt end of conduit, stents in adequate position. 29F fole placed across distal conduit (past abd fascia) 2/27 but still all UOP via urinoma drain. Urinoma drain clamped 2/28 and now copious output antegrade by urostomy (>2L/day).   3 - Bacteruria - UCX 2/23 from urinoma with e. Coli and bacteriodes. On primaxin IV initially, transitioned to PO bactrim + Flagyl 3/2.  [place] 2/22 NGTD.  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 NO clinical sequelae nausea / fevers / emesis or free air by intake CT 2/23. Stable / Improved on serial exams.   Today "[name]" is stable. No flank / abd pain. Transitioned to PO ABX yesterday. Wants to try to go home with PT instead of SNF if possible at discharge.   Objective: Vital signs in last 24 hours: Temp:  [98.4 F (36.9 C)-98.7 F (37.1 C)] 98.4 F (36.9 C) (03/03 0551) Pulse Rate:  [77-80] 77 (03/03 0551) Resp:  [14-18] 14 (03/03 0551) BP: (128-160)/(53-90) 141/53 mmHg (03/03 0551) SpO2:  [98 %-100 %] 98 % (03/03 0551) Last BM Date: 02/18/16  Intake/Output from previous day: 03/02 0701 - 03/03 0700 In: 940 [P.O.:840; IV Piggyback:100] Out: 2975 [Urine:2975] Intake/Output this shift:    General appearance: alert, cooperative, appears stated age and at baseline Head: Normocephalic, without obvious abnormality, atraumatic Eyes: negative Nose: Nares normal. Septum midline. Mucosa normal. No drainage or sinus tenderness. Throat: lips, mucosa, and tongue normal; teeth and gums normal Neck: supple, symmetrical, trachea midline Back: symmetric, no curvature. ROM normal. No CVA tenderness. Resp: non-labored on room  air Cardio: Nl rate GI: soft, non-tender; bowel sounds normal; no masses,  no organomegaly Pelvic: external genitalia normal and vagina normal without discharge Extremities: extremities normal, atraumatic, no cyanosis or edema Skin: Skin color, texture, turgor normal. No rashes or lesions Lymph nodes: Cervical, supraclavicular, and axillary nodes normal. Neurologic: Grossly normal Incision/Wound: RLQ Urostomy pink / patent with copious clear urine with some small mucus as expected. Bander stents and distal foley in ostomy appliance. LLQ Urinoma drain clamped, no drainage at skin site. LLQ Colostomy patent of gas / stool, edges appear dusky but non-necrotic throughout majority of visible edge.  Lab Results:  No results for input(s): WBC, HGB, HCT, PLT in the last 72 hours. BMET  Recent Labs  02/18/16 0435  NA 137  K 4.3  CL 102  CO2 27  GLUCOSE 116*  BUN 13  CREATININE 0.72  CALCIUM 8.3*   PT/INR No results for input(s): LABPROT, INR in the last 72 hours. ABG No results for input(s): PHART, HCO3 in the last 72 hours.  Invalid input(s): PCO2, PO2  Studies/Results: No results found.  Anti-infectives: Anti-infectives    Start     Dose/Rate Route Frequency Ordered Stop   02/18/16 1400  sulfamethoxazole-trimethoprim (BACTRIM DS,SEPTRA DS) 800-160 MG per tablet 1 tablet     1 tablet Oral Every 12 hours 02/18/16 1148     02/18/16 1400  metroNIDAZOLE (FLAGYL) tablet 500 mg     500 mg Oral 3 times per day 02/18/16 1235     02/11/16 0900  imipenem-cilastatin (PRIMAXIN) 500 mg in sodium chloride 0.9 %  100 mL IVPB  Status:  Discontinued     500 mg 200 mL/hr over 30 Minutes Intravenous Every 8 hours 02/11/16 0823 02/18/16 1235      Assessment/Plan:  1 - Stage 4 Bladder Cancer - may be candidate for chemo eventually, no furhter cancer-directed therapy in house.   2 - Urinoma / Urine Leak - Now with large volume antegrade drainage with large foley across conduit fascia and  clamping of urinoma drain. This remains encouraging. Plan to keep urinoma drain clamped at least 7-14 days if tolerates, then loop-o-gram and remove urinoma drain at that time if no continued extrav from proximal conduit.  3 - Bacteruria - now afebrile, continue PO Bactrim + Flagyl for approximate 2week total course.   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   5 - Disposition - likely DC early next week to home with home health RN (ostomy teaching / supplies) and PT (return to strength and better ambulation) on Mon or Tues as long as doing well clinically, stomas remain viable, she remains afebrile.   [facility], [name] 02/19/2016

## 2016-02-19 NOTE — Progress Notes (Signed)
Even though pt's gravity drain is clamped off, there are still orders to flush tid.  Pt was stated that no one had flushed it since Mon, and she was afraid to let me do it  MD, please clarify it we still need to be flushing it.

## 2016-02-19 NOTE — Progress Notes (Signed)
Pt having trouble sleeping. At home she takes an over the counter med called midnight.  She would like to be able to take it here to help her sleep.

## 2016-02-19 NOTE — Progress Notes (Signed)
Patient ID: [name], female   DOB: 1946-02-17, 70 y.o.   MRN: [id number]    Referring Physician(s): Manny,T  Supervising Physician: Sandi Mariscal  Chief Complaint: Left retroperitoneal abscess/urinoma   Subjective:  Pt doing well; no new c/o; some soreness at LLQ drain site; urostomy with improved output  Allergies: Ciprofloxacin and Penicillins  Medications: Prior to Admission medications   Medication Sig Start Date End Date Taking? Authorizing Provider  Docusate Calcium (STOOL SOFTENER PO) Take 1 capsule by mouth daily as needed (constipation.).   Yes Historical Provider, MD  isoniazid (NYDRAZID) 300 MG tablet Take 300 mg by mouth daily.   Yes Historical Provider, MD  ketorolac (TORADOL) 10 MG tablet Take 1 tablet (10 mg total) by mouth every 8 (eight) hours as needed for moderate pain. Post-operatively 02/05/16  Yes Alexis Frock, MD  oxyCODONE-acetaminophen (PERCOCET/ROXICET) 5-325 MG tablet Take 1-2 tablets by mouth every 6 (six) hours as needed for severe pain. Post-operatively 02/05/16  Yes Alexis Frock, MD     Vital Signs: BP 141/53 mmHg  Pulse 77  Temp(Src) 98.4 F (36.9 C) (Oral)  Resp 14  Ht '5\' 5"'$  (1.651 m)  Wt 145 lb (65.772 kg)  BMI 24.13 kg/m2  SpO2 98%  Physical Exam awake/alert; LLQ drain intact, insertion site ok, minimal erythema, clamped by urology; urostomy output 300 cc today , 1.3 liters yesterday  Imaging: No results found.  Labs:  CBC:  Recent Labs  02/11/16 0431 02/12/16 0455 02/14/16 0536 02/15/16 0427  WBC 12.0* 8.3 6.6 7.0  HGB 8.4* 7.8* 9.3* 8.6*  HCT 25.8* 24.4* 28.9* 27.3*  PLT 709* 611* 562* 490*    COAGS:  Recent Labs  02/11/16 0815  INR 1.25    BMP:  Recent Labs  02/11/16 2339 02/14/16 0536 02/15/16 0427 02/18/16 0435  NA 138 134* 138 137  K 3.6 3.6 3.6 4.3  CL 107 98* 100* 102  CO2 '25 27 28 27  '$ GLUCOSE 165* 133* 129* 116*  BUN '11 11 11 13  '$ CALCIUM 7.9* 8.4* 8.3* 8.3*  CREATININE 0.63 0.53  0.50 0.72  GFRNONAA >60 >60 >60 >60  GFRAA >60 >60 >60 >60    LIVER FUNCTION TESTS:  Recent Labs  01/26/16 1356 02/10/16 1342  BILITOT 1.0 0.9  AST 31 106*  ALT 29 154*  ALKPHOS 58 104  PROT 6.6 6.5  ALBUMIN 4.4 2.4*    Assessment and Plan: S/p drainage of large left RP urinoma/abscess 2/23; AF; no new lab data; plans as outlined by Dr. Tresa Moore-  drain to remain clamped for additional 7-14 days, then obtain f/u loopogram with possible drain removal if no cont leak  Electronically Signed: D. Rowe Robert 02/19/2016, 1:52 PM   I spent a total of 15 minutes at the the patient's bedside AND on the patient's hospital floor or unit, greater than 50% of which was counseling/coordinating care for LLQ drain

## 2016-02-20 NOTE — Progress Notes (Signed)
Subjective: Patient reports that she is doing well. She has no specific complaints such as flank pain. She tolerating regular diet.  Objective: Vital signs in last 24 hours: Temp:  [97.9 F (36.6 C)-98.9 F (37.2 C)] 98.6 F (37 C) (03/04 0647) Pulse Rate:  [82-88] 84 (03/04 0647) Resp:  [16-18] 16 (03/04 0647) BP: (126-158)/(54-55) 126/54 mmHg (03/04 0647) SpO2:  [98 %-99 %] 98 % (03/04 0647)  Intake/Output from previous day: 03/03 0701 - 03/04 0700 In: 600 [P.O.:600] Out: 1351 [Urine:1350; Stool:1] Intake/Output this shift:    Physical Exam:  Her abdomen is soft and flat.  Lab Results: No results for input(s): HGB, HCT in the last 72 hours. BMET  Recent Labs  02/18/16 0435  NA 137  K 4.3  CL 102  CO2 27  GLUCOSE 116*  BUN 13  CREATININE 0.72  CALCIUM 8.3*   No results for input(s): LABPT, INR in the last 72 hours. No results for input(s): LABURIN in the last 72 hours. Results for orders placed or performed during the hospital encounter of 02/10/16  Blood culture (routine x 2)     Status: None   Collection Time: 02/10/16  1:30 PM  Result Value Ref Range Status   Specimen Description BLOOD RIGHT ANTECUBITAL  Final   Special Requests BOTTLES DRAWN AEROBIC AND ANAEROBIC 5 CC EA  Final   Culture  Setup Time   Final    GRAM POSITIVE COCCI IN CLUSTERS AEROBIC BOTTLE ONLY CRITICAL RESULT CALLED TO, READ BACK BY AND VERIFIED WITH: B MAY RN 1930 02/11/16 A BROWNING    Culture   Final    STAPHYLOCOCCUS SPECIES (COAGULASE NEGATIVE) THE SIGNIFICANCE OF ISOLATING THIS ORGANISM FROM A SINGLE SET OF BLOOD CULTURES WHEN MULTIPLE SETS ARE DRAWN IS UNCERTAIN. PLEASE NOTIFY THE MICROBIOLOGY DEPARTMENT WITHIN ONE WEEK IF SPECIATION AND SENSITIVITIES ARE REQUIRED. Performed at [facility]    Report Status 02/14/2016 FINAL  Final  Blood culture (routine x 2)     Status: None   Collection Time: 02/10/16  1:40 PM  Result Value Ref Range Status   Specimen Description  BLOOD LEFT ANTECUBITAL  Final   Special Requests BOTTLES DRAWN AEROBIC AND ANAEROBIC 5 CC EA  Final   Culture   Final    NO GROWTH 5 DAYS Performed at [facility]    Report Status 02/15/2016 FINAL  Final  MRSA PCR Screening     Status: Abnormal   Collection Time: 02/10/16  8:18 PM  Result Value Ref Range Status   MRSA by PCR POSITIVE (A) NEGATIVE Final    Comment:        The GeneXpert MRSA Assay (FDA approved for NASAL specimens only), is one component of a comprehensive MRSA colonization surveillance program. It is not intended to diagnose MRSA infection nor to guide or monitor treatment for MRSA infections. RESULT CALLED TO, READ BACK BY AND VERIFIED WITH: P.PELOTTE,RN AT 2308 ON 02/10/16 BY W.SHEA   Body fluid culture     Status: None   Collection Time: 02/11/16  1:27 PM  Result Value Ref Range Status   Specimen Description PERITONEAL CAVITY  Final   Special Requests Normal  Final   Gram Stain   Final    ABUNDANT WBC PRESENT,BOTH PMN AND MONONUCLEAR ABUNDANT GRAM NEGATIVE RODS ABUNDANT GRAM POSITIVE COCCI IN CHAINS IN PAIRS Gram Stain Report Called to,Read Back By and Verified With: Renold Don RN [phone] 02/11/16 A BROWNING    Culture   Final  ABUNDANT ESCHERICHIA COLI ABUNDANT AEROCOCCUS URINAE Standardized susceptibility testing for this organism is not available. Performed at [facility]    Report Status 02/15/2016 FINAL  Final   Organism ID, Bacteria ESCHERICHIA COLI  Final      Susceptibility   Escherichia coli - MIC*    AMPICILLIN 4 SENSITIVE Sensitive     CEFAZOLIN <=4 SENSITIVE Sensitive     CEFEPIME <=1 SENSITIVE Sensitive     CEFTAZIDIME <=1 SENSITIVE Sensitive     CEFTRIAXONE <=1 SENSITIVE Sensitive     CIPROFLOXACIN >=4 RESISTANT Resistant     GENTAMICIN <=1 SENSITIVE Sensitive     IMIPENEM <=0.25 SENSITIVE Sensitive     TRIMETH/SULFA <=20 SENSITIVE Sensitive     AMPICILLIN/SULBACTAM <=2 SENSITIVE Sensitive     PIP/TAZO <=4 SENSITIVE  Sensitive     * ABUNDANT ESCHERICHIA COLI  Anaerobic culture     Status: None   Collection Time: 02/11/16  1:27 PM  Result Value Ref Range Status   Specimen Description FLUID PERITONEAL  Final   Special Requests ADDED ON 02/14/16  Final   Gram Stain   Final    ABUNDANT WBC PRESENT,BOTH PMN AND MONONUCLEAR ABUNDANT GRAM NEGATIVE RODS ABUNDANT GRAM POSITIVE COCCI IN PAIRS IN CHAINS    Culture   Final    ABUNDANT ANAEROBIC GRAM POSITIVE COCCI UNABLE TO FURTHER IDENTIFY ABUNDANT BACTEROIDES SPECIES BETA LACTAMASE POSITIVE Performed at [facility]    Report Status 02/16/2016 FINAL  Final    Studies/Results: No results found.  Assessment/Plan:  1 - Stage 4 Bladder Cancer - may be candidate for chemo eventNo change in this planually, no furhter cancer-directed therapy in house.   2 - Urinoma / Urine Leak - Now with large volume antegrade drainage with large foley across conduit fascia and clamping of urinoma drain. This remains encouraging. Plan to keep urinoma drain clamped at least 7-14 days if tolerates, then loop-o-gram and remove urinoma drain at that time if no continued extrav from proximal conduit.  3 - Bacteruria - now afebrile, continue PO Bactrim + Flagyl for approximate 2week total course.   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   5 - Disposition - likely DC early next week to home with home health RN (ostomy teaching / supplies) and PT (return to strength and better ambulation) on Mon or Tues as long as doing well clinically, stomas remain viable, she remains afebrile.    LOS: 10 days   Alondra Sahni C 02/20/2016, 8:38 AM

## 2016-02-21 NOTE — Progress Notes (Signed)
Subjective: Patient reports no complaints.  Objective: Vital signs in last 24 hours: Temp:  [98.7 F (37.1 C)-99 F (37.2 C)] 98.7 F (37.1 C) (03/05 0607) Pulse Rate:  [77-81] 80 (03/05 0607) Resp:  [18] 18 (03/05 0607) BP: (125-141)/(56-60) 125/56 mmHg (03/05 0607) SpO2:  [97 %-98 %] 97 % (03/05 0607)  Intake/Output from previous day: 03/04 0701 - 03/05 0700 In: 240 [P.O.:240] Out: 1766 [Urine:1750; Stool:16] Intake/Output this shift:    Physical Exam:  General:alert, cooperative and no distress  Her abdomen is soft and nontender. Her colostomy appears pink and viable Her urostomy also appears viable with 2 stents in place as well as a catheter. The urine is clear.   Lab Results: No results for input(s): HGB, HCT in the last 72 hours. BMET No results for input(s): NA, K, CL, CO2, GLUCOSE, BUN, CREATININE, CALCIUM in the last 72 hours. No results for input(s): LABPT, INR in the last 72 hours. No results for input(s): LABURIN in the last 72 hours. Results for orders placed or performed during the hospital encounter of 02/10/16  Blood culture (routine x 2)     Status: None   Collection Time: 02/10/16  1:30 PM  Result Value Ref Range Status   Specimen Description BLOOD RIGHT ANTECUBITAL  Final   Special Requests BOTTLES DRAWN AEROBIC AND ANAEROBIC 5 CC EA  Final   Culture  Setup Time   Final    GRAM POSITIVE COCCI IN CLUSTERS AEROBIC BOTTLE ONLY CRITICAL RESULT CALLED TO, READ BACK BY AND VERIFIED WITH: B MAY RN 1930 02/11/16 A BROWNING    Culture   Final    STAPHYLOCOCCUS SPECIES (COAGULASE NEGATIVE) THE SIGNIFICANCE OF ISOLATING THIS ORGANISM FROM A SINGLE SET OF BLOOD CULTURES WHEN MULTIPLE SETS ARE DRAWN IS UNCERTAIN. PLEASE NOTIFY THE MICROBIOLOGY DEPARTMENT WITHIN ONE WEEK IF SPECIATION AND SENSITIVITIES ARE REQUIRED. Performed at [facility]    Report Status 02/14/2016 FINAL  Final  Blood culture (routine x 2)     Status: None   Collection Time:  02/10/16  1:40 PM  Result Value Ref Range Status   Specimen Description BLOOD LEFT ANTECUBITAL  Final   Special Requests BOTTLES DRAWN AEROBIC AND ANAEROBIC 5 CC EA  Final   Culture   Final    NO GROWTH 5 DAYS Performed at [facility]    Report Status 02/15/2016 FINAL  Final  MRSA PCR Screening     Status: Abnormal   Collection Time: 02/10/16  8:18 PM  Result Value Ref Range Status   MRSA by PCR POSITIVE (A) NEGATIVE Final    Comment:        The GeneXpert MRSA Assay (FDA approved for NASAL specimens only), is one component of a comprehensive MRSA colonization surveillance program. It is not intended to diagnose MRSA infection nor to guide or monitor treatment for MRSA infections. RESULT CALLED TO, READ BACK BY AND VERIFIED WITH: P.PELOTTE,RN AT 2308 ON 02/10/16 BY W.SHEA   Body fluid culture     Status: None   Collection Time: 02/11/16  1:27 PM  Result Value Ref Range Status   Specimen Description PERITONEAL CAVITY  Final   Special Requests Normal  Final   Gram Stain   Final    ABUNDANT WBC PRESENT,BOTH PMN AND MONONUCLEAR ABUNDANT GRAM NEGATIVE RODS ABUNDANT GRAM POSITIVE COCCI IN CHAINS IN PAIRS Gram Stain Report Called to,Read Back By and Verified With: Renold Don RN [phone] 02/11/16 A BROWNING    Culture   Final  ABUNDANT ESCHERICHIA COLI ABUNDANT AEROCOCCUS URINAE Standardized susceptibility testing for this organism is not available. Performed at [facility]    Report Status 02/15/2016 FINAL  Final   Organism ID, Bacteria ESCHERICHIA COLI  Final      Susceptibility   Escherichia coli - MIC*    AMPICILLIN 4 SENSITIVE Sensitive     CEFAZOLIN <=4 SENSITIVE Sensitive     CEFEPIME <=1 SENSITIVE Sensitive     CEFTAZIDIME <=1 SENSITIVE Sensitive     CEFTRIAXONE <=1 SENSITIVE Sensitive     CIPROFLOXACIN >=4 RESISTANT Resistant     GENTAMICIN <=1 SENSITIVE Sensitive     IMIPENEM <=0.25 SENSITIVE Sensitive     TRIMETH/SULFA <=20 SENSITIVE Sensitive      AMPICILLIN/SULBACTAM <=2 SENSITIVE Sensitive     PIP/TAZO <=4 SENSITIVE Sensitive     * ABUNDANT ESCHERICHIA COLI  Anaerobic culture     Status: None   Collection Time: 02/11/16  1:27 PM  Result Value Ref Range Status   Specimen Description FLUID PERITONEAL  Final   Special Requests ADDED ON 02/14/16  Final   Gram Stain   Final    ABUNDANT WBC PRESENT,BOTH PMN AND MONONUCLEAR ABUNDANT GRAM NEGATIVE RODS ABUNDANT GRAM POSITIVE COCCI IN PAIRS IN CHAINS    Culture   Final    ABUNDANT ANAEROBIC GRAM POSITIVE COCCI UNABLE TO FURTHER IDENTIFY ABUNDANT BACTEROIDES SPECIES BETA LACTAMASE POSITIVE Performed at [facility]    Report Status 02/16/2016 FINAL  Final    Studies/Results: No results found.  Assessment/Plan: 1 - Stage 4 Bladder Cancer - may be candidate for chemo eventNo change in this planually, no furhter cancer-directed therapy in house.   2 - Urinoma / Urine Leak - Now with large volume antegrade drainage with large foley across conduit fascia and clamping of urinoma drain. This remains encouraging. Plan to keep urinoma drain clamped at least 7-14 days if tolerates, then loop-o-gram and remove urinoma drain at that time if no continued extrav from proximal conduit.  3 - Bacteruria - now afebrile, continue PO Bactrim + Flagyl for approximate 2week total course.   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 Her colostomy appears to be pinking up nicely.   5 - Disposition - likely DC early next week to home with home health RN (ostomy teaching / supplies) and PT (return to strength and better ambulation) on Mon or Tues as long as doing well clinically, stomas remain viable, she remains afebrile.   No new urological changes in her treatment plan at this time.   LOS: 11 days   [name] C 02/21/2016, 8:40 AM

## 2016-02-22 MED ORDER — METRONIDAZOLE 500 MG PO TABS: 500.0000 mg | ORAL_TABLET | Freq: Two times a day (BID) | ORAL | Status: DC

## 2016-02-22 MED ORDER — METRONIDAZOLE 500 MG PO TABS: 500.0000 mg | ORAL_TABLET | Freq: Two times a day (BID) | ORAL | Status: AC

## 2016-02-22 MED ORDER — SULFAMETHOXAZOLE-TRIMETHOPRIM 800-160 MG PO TABS: 1.0000 | ORAL_TABLET | Freq: Two times a day (BID) | ORAL | Status: DC

## 2016-02-22 MED ORDER — SULFAMETHOXAZOLE-TRIMETHOPRIM 800-160 MG PO TABS: 1.0000 | ORAL_TABLET | Freq: Two times a day (BID) | ORAL | Status: AC

## 2016-02-22 NOTE — Progress Notes (Signed)
Occupational Therapy Treatment Patient Details Name: [name] MRN: [id number] DOB: [birth year] Today's Date: 02/22/2016    History of present illness This 70 y.o. female admitted with c/o pain to stoma shooting into her back.  Dx:  with uroma with  apparent anastomotic leak of at least the Rt ueretral anastomosis.  Percutaneous drain placed.  PMH includes:  Bladder CA with ercent radical cystectomy with pelvic exenteration, hysterectomy, bil. salingo-oophorectomy, pelvic lymphadenectomy, colon conduit urinary diversion and lysis of adhesions, sigmoid colectomy and end colostomy 01/27/16.    OT comments  pts grand daughter will a her at home  Follow Up Recommendations  Supervision/Assistance - 24 hour;Home health OT    Equipment Recommendations  None recommended by OT    Recommendations for Other Services      Precautions / Restrictions Precautions Precautions: Fall       Mobility Bed Mobility Overal bed mobility: Needs Assistance Bed Mobility: Rolling;Sidelying to Sit Rolling: Supervision Sidelying to sit: Supervision       General bed mobility comments: HOB raised, pt uses bed rails to self assist, encouraged to roll, protect the abdomen  Transfers Overall transfer level: Needs assistance Equipment used: Rolling walker (2 wheeled) Transfers: Sit to/from [facility] Sit to Stand: Supervision Stand pivot transfers: Supervision       General transfer comment: VCs hand placement        ADL       Grooming: Standing;Set up   Upper Body Bathing: Set up;Sitting       Upper Body Dressing : Set up;Sitting   Lower Body Dressing: Minimal assistance;Sit to/from stand Lower Body Dressing Details (indicate cue type and reason): graddauughter will A               General ADL Comments: pt excited about going home. discussed options for dressing at home with bags.  Pt has made significant progress.                  Cognition   Behavior  During Therapy: WFL for tasks assessed/performed Overall Cognitive Status: Within Functional Limits for tasks assessed                               General Comments Pt used shampoo cap and washed hair this Ot session    Pertinent Vitals/ Pain       Pain Score: 3  Pain Location: stomach with dressing task- socks Pain Descriptors / Indicators: Sore Pain Intervention(s): Monitored during session         Frequency Min 2X/week     Progress Toward Goals  OT Goals(current goals can now be found in the care plan section)  Progress towards OT goals: Progressing toward goals     Plan Discharge plan remains appropriate;Discharge plan needs to be updated    Co-evaluation                 End of Session Equipment Utilized During Treatment: Rolling walker;Gait belt   Activity Tolerance Patient tolerated treatment well   Patient Left in chair;with call bell/phone within reach;with chair alarm set   Nurse Communication Mobility status;Other (comment) (ADL's completed, bed linens ready to be changed)        Time: 2706-2376 OT Time Calculation (min): 21 min  Charges: OT General Charges $OT Visit: 1 Procedure OT Treatments $Self Care/Home Management : 8-22 mins  Mahkayla Preece, Thereasa Parkin 02/22/2016, 12:18 PM

## 2016-02-22 NOTE — Discharge Instructions (Signed)
1 - Call MD or go to ER for fever >102, severe pain / nausea / vomiting not relieved by medications, or acute change in medical status  

## 2016-02-22 NOTE — Discharge Summary (Signed)
Physician Discharge Summary  Patient ID: [name] MRN: [id number] DOB/AGE: [birth year] 70 y.o.  Admit date: 02/10/2016 Discharge date: 02/22/2016  Admission Diagnoses: Urinoma, Bladder Cancer,   Discharge Diagnoses:  Active Problems:   Urinoma   Intra-abdominal fluid collection   Paraureteric urinoma   Protein-calorie malnutrition, severe   Discharged Condition: fair  Hospital Course:   1 - Stage 4 Bladder Cancer - s/p open cystectomy / TAH+BSO / colostomy / colon conduit 01/27/2016 in combined Urol and Gen surg procedure. Path pT4N2.   2 - Urinoma / Urine Leak - new large urinoma on admission CT 2/23 with aparant leak from butt end of colon conduit / Rt ureteral anastamosis. Some dilation of butt end of conduit, stents in adequate position. 47F fole placed across distal conduit (past abd fascia) 2/27 but still all UOP via urinoma drain. Urinoma drain clamped 2/28 and now copious output antegrade by urostomy (>2L/day).   3 - Bacteruria - UCX 2/23 from urinoma with e. Coli and bacteriodes. On primaxin IV initially, transitioned to PO bactrim + Flagyl 3/2. [place] 2/22 NGTD.   4 - Dusky Colostomy - dusky colostomy noted by [facility]. NO clinical sequelae nausea / fevers / emesis or free air by intake CT 2/23. Stable / Improved on serial exams.   5 - Disposition - PT will be discharged to home with home health PT and RN (dressing and osotmy changes).  By day of discahrge she is at functional baseline, pain controlled, maintaining nutrition, and felt to be adequate for discharge.  Consults: general surgery and PT, Ostomy RN  Significant Diagnostic Studies: labs: as per above   Treatments: IV hydration, antibiotics: primxin; IR drain placement  Discharge Exam: Blood pressure 145/61, pulse 78, temperature 99.1 F (37.3 C), temperature source Oral, resp. rate 18, height '5\' 5"'$  (1.651 m), weight 65.772 kg (145 lb), SpO2 97 %. General appearance: alert, cooperative and appears stated  age Eyes: negative Nose: Nares normal. Septum midline. Mucosa normal. No drainage or sinus tenderness. Throat: lips, mucosa, and tongue normal; teeth and gums normal Neck: supple, symmetrical, trachea midline Back: symmetric, no curvature. ROM normal. No CVA tenderness. Resp: non-labored on room air Cardio: Nl rate GI: soft, non-tender; bowel sounds normal; no masses,  no organomegaly Extremities: extremities normal, atraumatic, no cyanosis or edema Pulses: 2+ and symmetric Skin: Skin color, texture, turgor normal. No rashes or lesions Lymph nodes: Cervical, supraclavicular, and axillary nodes normal. Neurologic: Grossly normal Incision/Wound: RLQ Urostomy pink / patent with bilateral distal bander stnts, and distal end of large foley in place with copious yellow urine with scan mucus. Midinline inciison c/d/i, slight separation at superior edge with pink granulation. LLQ colostomy patent of stool and gas, no frank necrosis. Some mucosal soughing.  Disposition: 06-Home-Health Care Svc     Medication List    STOP taking these medications        isoniazid 300 MG tablet  Commonly known as:  NYDRAZID     ketorolac 10 MG tablet  Commonly known as:  TORADOL     STOOL SOFTENER PO      TAKE these medications        metroNIDAZOLE 500 MG tablet  Commonly known as:  FLAGYL  Take 1 tablet (500 mg total) by mouth 2 (two) times daily.     oxyCODONE-acetaminophen 5-325 MG tablet  Commonly known as:  PERCOCET/ROXICET  Take 1-2 tablets by mouth every 6 (six) hours as needed for severe pain. Post-operatively     sulfamethoxazole-trimethoprim 800-160  MG tablet  Commonly known as:  BACTRIM DS,SEPTRA DS  Take 1 tablet by mouth 2 (two) times daily.         SignedAlexis Frock 02/22/2016, 7:50 AM

## 2016-02-22 NOTE — Consult Note (Signed)
WOC ostomy follow up: Colon Conduit (Urostomy) Stoma type/location: RUQ colon conduit. 87F catheter in conduit past fascia is patent. Balloon inserted with approximately 20ms of NS; catheter is secure, but movable in the conduit. Stomal assessment/size: 1 and 1/4 inch oval stoma with 2 stents intact. Peristomal assessment: intact Treatment options for stomal/peristomal skin: two skin barrier rings Output: mucous only. Cloudy, thick urine.  Ostomy pouching: 1pc convex with 2 skin barrier rings. Stents and catheter are fed through aperture in pouch and coiled in pouching system. Education provided: Patient taught about mucus and urinary stomas [place]: Yes (last admission) Supplies ordered for discharge today.   WOC ostomy follow up Stoma type/location: LUQ colostomy Stomal assessment/size: 1 and 1/8 x 1 and 3/4 oval. Black "cap" of necrotic tissue is sloughing, mucocutaneous separation at 3 and 9 o'clock with scant purulent drainage from these sites. Peristomal assessment: Intact, with mild erythema circumferentially Treatment options for stomal/peristomal skin: Skin barrier ring Output: flatus and a small amount of liquid stool in pouch Ostomy pouching: 1pc. Convex with skin barrier ring Education provided: Patient taught that stoma continues to slough and that when the necrotic tissue is gone, stoma is likely to be at or below skin level.  Stoma continues to slough today. Still adherent.  [place] (last [place])   Supplies for both ostomies are ordered today in anticipation of discharge.  Will not follow at this time.  Please re-consult if needed.  Discharging today.  KDomenic MorasRN BSN CPahrumpPager [phone]

## 2016-03-14 ENCOUNTER — Emergency Department (HOSPITAL_COMMUNITY): Payer: PPO

## 2016-03-14 ENCOUNTER — Encounter (HOSPITAL_COMMUNITY): Payer: Self-pay | Admitting: Emergency Medicine

## 2016-03-14 ENCOUNTER — Inpatient Hospital Stay (HOSPITAL_COMMUNITY)
Admission: EM | Admit: 2016-03-14 | Discharge: 2016-03-16 | DRG: 687 | Disposition: A | Discharge status: Hospice | Payer: PPO | Attending: Urology | Admitting: Urology

## 2016-03-14 DIAGNOSIS — R630 Anorexia: Secondary | ICD-10-CM | POA: Diagnosis present

## 2016-03-14 DIAGNOSIS — Z9049 Acquired absence of other specified parts of digestive tract: Secondary | ICD-10-CM

## 2016-03-14 DIAGNOSIS — M1990 Unspecified osteoarthritis, unspecified site: Secondary | ICD-10-CM | POA: Diagnosis present

## 2016-03-14 DIAGNOSIS — E871 Hypo-osmolality and hyponatremia: Secondary | ICD-10-CM | POA: Diagnosis present

## 2016-03-14 DIAGNOSIS — Z90710 Acquired absence of both cervix and uterus: Secondary | ICD-10-CM

## 2016-03-14 DIAGNOSIS — Z881 Allergy status to other antibiotic agents status: Secondary | ICD-10-CM

## 2016-03-14 DIAGNOSIS — Z66 Do not resuscitate: Secondary | ICD-10-CM | POA: Diagnosis present

## 2016-03-14 DIAGNOSIS — Z8052 Family history of malignant neoplasm of bladder: Secondary | ICD-10-CM

## 2016-03-14 DIAGNOSIS — N19 Unspecified kidney failure: Secondary | ICD-10-CM

## 2016-03-14 DIAGNOSIS — E860 Dehydration: Secondary | ICD-10-CM | POA: Diagnosis present

## 2016-03-14 DIAGNOSIS — Z906 Acquired absence of other parts of urinary tract: Secondary | ICD-10-CM

## 2016-03-14 DIAGNOSIS — Z808 Family history of malignant neoplasm of other organs or systems: Secondary | ICD-10-CM

## 2016-03-14 DIAGNOSIS — R627 Adult failure to thrive: Secondary | ICD-10-CM | POA: Diagnosis present

## 2016-03-14 DIAGNOSIS — Z79899 Other long term (current) drug therapy: Secondary | ICD-10-CM

## 2016-03-14 DIAGNOSIS — L8990 Pressure ulcer of unspecified site, unspecified stage: Secondary | ICD-10-CM | POA: Insufficient documentation

## 2016-03-14 DIAGNOSIS — C675 Malignant neoplasm of bladder neck: Principal | ICD-10-CM | POA: Diagnosis present

## 2016-03-14 DIAGNOSIS — E7800 Pure hypercholesterolemia, unspecified: Secondary | ICD-10-CM | POA: Diagnosis present

## 2016-03-14 DIAGNOSIS — C775 Secondary and unspecified malignant neoplasm of intrapelvic lymph nodes: Secondary | ICD-10-CM | POA: Diagnosis present

## 2016-03-14 DIAGNOSIS — E119 Type 2 diabetes mellitus without complications: Secondary | ICD-10-CM | POA: Diagnosis present

## 2016-03-14 DIAGNOSIS — D72829 Elevated white blood cell count, unspecified: Secondary | ICD-10-CM | POA: Diagnosis present

## 2016-03-14 DIAGNOSIS — Z800 Family history of malignant neoplasm of digestive organs: Secondary | ICD-10-CM

## 2016-03-14 DIAGNOSIS — Z8711 Personal history of peptic ulcer disease: Secondary | ICD-10-CM

## 2016-03-14 DIAGNOSIS — Z6824 Body mass index (BMI) 24.0-24.9, adult: Secondary | ICD-10-CM

## 2016-03-14 DIAGNOSIS — Z880 Allergy status to penicillin: Secondary | ICD-10-CM

## 2016-03-14 DIAGNOSIS — Z7189 Other specified counseling: Secondary | ICD-10-CM | POA: Insufficient documentation

## 2016-03-14 DIAGNOSIS — N179 Acute kidney failure, unspecified: Secondary | ICD-10-CM | POA: Diagnosis present

## 2016-03-14 DIAGNOSIS — Z882 Allergy status to sulfonamides status: Secondary | ICD-10-CM

## 2016-03-14 DIAGNOSIS — E875 Hyperkalemia: Secondary | ICD-10-CM

## 2016-03-14 DIAGNOSIS — N368 Other specified disorders of urethra: Secondary | ICD-10-CM | POA: Diagnosis present

## 2016-03-14 DIAGNOSIS — Z87891 Personal history of nicotine dependence: Secondary | ICD-10-CM

## 2016-03-14 DIAGNOSIS — N1330 Unspecified hydronephrosis: Secondary | ICD-10-CM | POA: Diagnosis present

## 2016-03-14 DIAGNOSIS — C679 Malignant neoplasm of bladder, unspecified: Secondary | ICD-10-CM | POA: Diagnosis present

## 2016-03-14 DIAGNOSIS — F4323 Adjustment disorder with mixed anxiety and depressed mood: Secondary | ICD-10-CM | POA: Insufficient documentation

## 2016-03-14 DIAGNOSIS — R188 Other ascites: Secondary | ICD-10-CM | POA: Diagnosis present

## 2016-03-14 DIAGNOSIS — Z85038 Personal history of other malignant neoplasm of large intestine: Secondary | ICD-10-CM

## 2016-03-14 DIAGNOSIS — Z515 Encounter for palliative care: Secondary | ICD-10-CM | POA: Insufficient documentation

## 2016-03-14 DIAGNOSIS — R64 Cachexia: Secondary | ICD-10-CM | POA: Diagnosis present

## 2016-03-14 DIAGNOSIS — R531 Weakness: Secondary | ICD-10-CM | POA: Diagnosis not present

## 2016-03-14 DIAGNOSIS — Z933 Colostomy status: Secondary | ICD-10-CM

## 2016-03-14 LAB — URINALYSIS, ROUTINE W REFLEX MICROSCOPIC
BILIRUBIN URINE: NEGATIVE
Glucose, UA: NEGATIVE mg/dL
Ketones, ur: NEGATIVE mg/dL
NITRITE: NEGATIVE
PH: 5.5 (ref 5.0–8.0)
Protein, ur: >300 mg/dL — AB
SPECIFIC GRAVITY, URINE: 1.015 (ref 1.005–1.030)

## 2016-03-14 LAB — CBC WITH DIFFERENTIAL/PLATELET
Basophils Absolute: 0 10*3/uL (ref 0.0–0.1)
Basophils Relative: 0 %
EOS ABS: 0.1 10*3/uL (ref 0.0–0.7)
Eosinophils Relative: 0 %
HEMATOCRIT: 29.9 % — AB (ref 36.0–46.0)
HEMOGLOBIN: 10.3 g/dL — AB (ref 12.0–15.0)
LYMPHS ABS: 1.1 10*3/uL (ref 0.7–4.0)
LYMPHS PCT: 6 %
MCH: 27.8 pg (ref 26.0–34.0)
MCHC: 34.4 g/dL (ref 30.0–36.0)
MCV: 80.8 fL (ref 78.0–100.0)
MONOS PCT: 8 %
Monocytes Absolute: 1.4 10*3/uL — ABNORMAL HIGH (ref 0.1–1.0)
NEUTROS PCT: 86 %
Neutro Abs: 16.2 10*3/uL — ABNORMAL HIGH (ref 1.7–7.7)
Platelets: 405 10*3/uL — ABNORMAL HIGH (ref 150–400)
RBC: 3.7 MIL/uL — AB (ref 3.87–5.11)
RDW: 14.6 % (ref 11.5–15.5)
WBC: 18.9 10*3/uL — AB (ref 4.0–10.5)

## 2016-03-14 LAB — COMPREHENSIVE METABOLIC PANEL
ALK PHOS: 116 U/L (ref 38–126)
ALT: 44 U/L (ref 14–54)
ANION GAP: 12 (ref 5–15)
AST: 17 U/L (ref 15–41)
Albumin: 2.8 g/dL — ABNORMAL LOW (ref 3.5–5.0)
BUN: 162 mg/dL — ABNORMAL HIGH (ref 6–20)
CALCIUM: 8.8 mg/dL — AB (ref 8.9–10.3)
CO2: 15 mmol/L — AB (ref 22–32)
CREATININE: 3.62 mg/dL — AB (ref 0.44–1.00)
Chloride: 96 mmol/L — ABNORMAL LOW (ref 101–111)
GFR, EST AFRICAN AMERICAN: 14 mL/min — AB (ref >60)
GFR, EST NON AFRICAN AMERICAN: 12 mL/min — AB (ref >60)
Glucose, Bld: 125 mg/dL — ABNORMAL HIGH (ref 65–99)
Potassium: 7.3 mmol/L (ref 3.5–5.1)
SODIUM: 123 mmol/L — AB (ref 135–145)
TOTAL PROTEIN: 6.4 g/dL — AB (ref 6.5–8.1)
Total Bilirubin: 0.7 mg/dL (ref 0.3–1.2)

## 2016-03-14 LAB — URINE MICROSCOPIC-ADD ON: SQUAMOUS EPITHELIAL / LPF: NONE SEEN

## 2016-03-14 LAB — LIPASE, BLOOD: LIPASE: 62 U/L — AB (ref 11–51)

## 2016-03-14 MED ORDER — ONDANSETRON HCL 4 MG/2ML IJ SOLN
4.0000 mg | Freq: Once | INTRAMUSCULAR | Status: AC
  Administered 2016-03-14: 4 mg via INTRAVENOUS
  Filled 2016-03-14: qty 2

## 2016-03-14 MED ORDER — HYDROMORPHONE HCL 1 MG/ML IJ SOLN
1.0000 mg | Freq: Once | INTRAMUSCULAR | Status: AC
  Administered 2016-03-14: 1 mg via INTRAVENOUS
  Filled 2016-03-14: qty 1

## 2016-03-14 MED ORDER — INSULIN ASPART 100 UNIT/ML ~~LOC~~ SOLN
10.0000 [IU] | Freq: Once | SUBCUTANEOUS | Status: AC
  Administered 2016-03-14: 10 [IU] via SUBCUTANEOUS
  Filled 2016-03-14: qty 1

## 2016-03-14 MED ORDER — DEXTROSE 50 % IV SOLN
1.0000 | Freq: Once | INTRAVENOUS | Status: AC
  Administered 2016-03-14: 50 mL via INTRAVENOUS
  Filled 2016-03-14: qty 50

## 2016-03-14 MED ORDER — IOPAMIDOL (ISOVUE-300) INJECTION 61%
100.0000 mL | Freq: Once | INTRAVENOUS | Status: AC | PRN
  Administered 2016-03-14: 100 mL via INTRAVENOUS

## 2016-03-14 MED ORDER — CALCIUM GLUCONATE 10 % IV SOLN
1.0000 g | Freq: Once | INTRAVENOUS | Status: AC
  Administered 2016-03-15: 1 g via INTRAVENOUS
  Filled 2016-03-14: qty 10

## 2016-03-14 MED ORDER — SODIUM POLYSTYRENE SULFONATE 15 GM/60ML PO SUSP
30.0000 g | Freq: Once | ORAL | Status: DC
  Filled 2016-03-14: qty 120

## 2016-03-14 MED ORDER — CALCIUM GLUCONATE 10 % IV SOLN: 1.0000 g | Freq: Once | INTRAVENOUS | Status: DC

## 2016-03-14 MED ORDER — SODIUM CHLORIDE 0.9 % IV SOLN
INTRAVENOUS | Status: DC
  Administered 2016-03-14 – 2016-03-16 (×4): via INTRAVENOUS

## 2016-03-14 MED ORDER — DIPHENHYDRAMINE HCL 50 MG/ML IJ SOLN
12.5000 mg | Freq: Once | INTRAMUSCULAR | Status: AC
  Administered 2016-03-15: 12.5 mg via INTRAVENOUS
  Filled 2016-03-14: qty 1

## 2016-03-14 MED ORDER — IOHEXOL 300 MG/ML  SOLN: 100.0000 mL | Freq: Once | INTRAMUSCULAR | Status: DC | PRN

## 2016-03-14 MED ORDER — PROCHLORPERAZINE EDISYLATE 5 MG/ML IJ SOLN
5.0000 mg | Freq: Once | INTRAMUSCULAR | Status: AC
  Administered 2016-03-15: 5 mg via INTRAVENOUS
  Filled 2016-03-14: qty 2

## 2016-03-14 NOTE — ED Provider Notes (Signed)
CSN: [id number]     Arrival date & time 03/14/16  1824 History   First MD Initiated Contact with Patient 03/14/16 1833     Chief Complaint  Patient presents with  . Pain   . Cancer     (Consider location/radiation/quality/duration/timing/severity/associated sxs/prior Treatment) HPI Comments: Patient here complaining of increasing lower abdominal pain times several weeks worse over the past 4 days. Has a history of colon and bladder cancer. She has a colostomy as well as the urostomy. She denies any fever or chills. No vomiting or diarrhea. Has had increasing weakness the point where she can't stand up. Her pain is been constant and localized to the lower abdomen. Nothing makes it better or worse. Using her home medications without relief. Called her oncologist and was told to come here  The history is provided by the patient.    Past Medical History  Diagnosis Date  . Bladder cancer ([place])     Bladder and Colon 2014  . Colon cancer [facility])    Past Surgical History  Procedure Laterality Date  . Colon surgery  November 2014  . Robot assisted laparoscopic complete cystect ileal conduit N/A 01/27/2016    Procedure: XI ROBOTIC ASSISTED LAPAROSCOPIC COMPLETE CYSTECT ILEAL CONDUIT, HYSTERECTOMY, BILATERAL OOPHORECTOMY,  CYSTOSCOPY WITH INDOCYANINE GREEN DYE CONVERTED TO OPEN;  Surgeon: Alexis Frock, MD;  Location: [facility];  Service: Urology;  Laterality: N/A;  . Lymphadenectomy N/A 01/27/2016    Procedure: LYMPHADENECTOMY;  Surgeon: Alexis Frock, MD;  Location: [facility];  Service: Urology;  Laterality: N/A;  . Lymphadenectomy  01/27/2016    Procedure: LYMPHADENECTOMY;  Surgeon: Fanny Skates, MD;  Location: [facility];  Service: General;;  . Laparoscopic lysis of adhesions  01/27/2016    Procedure: EXTENSIVE  LYSIS OF ADHESIONS, REPAIR OF ADHESIONS;  Surgeon: Fanny Skates, MD;  Location: [facility];  Service: General;;  SPLENIC FLEXURE MOBILIZATION  . Colostomy closure  01/27/2016    Procedure: END  COLOSTOMY;  Surgeon: Fanny Skates, MD;  Location: [facility];  Service: General;;  . Partial colectomy N/A 01/27/2016    Procedure: SIGMOID  COLECTOMY;  Surgeon: Fanny Skates, MD;  Location: [facility];  Service: General;  Laterality: N/A;   No family history on file. Social History  Substance Use Topics  . Smoking status: Former Smoker    Quit date: 12/19/2012  . Smokeless tobacco: Never Used  . Alcohol Use: No   OB History    No data available     Review of Systems  All other systems reviewed and are negative.     Allergies  Ciprofloxacin; Sulfa antibiotics; and Penicillins  Home Medications   Prior to Admission medications   Medication Sig Start Date End Date Taking? Authorizing Provider  metroNIDAZOLE (FLAGYL) 500 MG tablet Take 1 tablet (500 mg total) by mouth 2 (two) times daily. Patient not taking: Reported on 03/14/2016 02/22/16   Alexis Frock, MD  oxyCODONE-acetaminophen (PERCOCET/ROXICET) 5-325 MG tablet Take 1-2 tablets by mouth every 6 (six) hours as needed for severe pain. Post-operatively Patient not taking: Reported on 03/14/2016 02/05/16   Alexis Frock, MD  sulfamethoxazole-trimethoprim (BACTRIM DS,SEPTRA DS) 800-160 MG tablet Take 1 tablet by mouth 2 (two) times daily. Patient not taking: Reported on 03/14/2016 02/22/16   Alexis Frock, MD   BP 137/53 mmHg  Pulse 84  Temp(Src) 97.3 F (36.3 C) (Oral)  Resp 16  Ht '5\' 5"'$  (1.651 m)  Wt 65.772 kg  BMI 24.13 kg/m2  SpO2 98% Physical Exam  Constitutional: She  is oriented to person, place, and time. She appears well-developed and well-nourished.  Non-toxic appearance. No distress.  HENT:  Head: Normocephalic and atraumatic.  Eyes: Conjunctivae, EOM and lids are normal. Pupils are equal, round, and reactive to light.  Neck: Normal range of motion. Neck supple. No tracheal deviation present. No thyroid mass present.  Cardiovascular: Normal rate, regular rhythm and normal heart sounds.  Exam reveals no gallop.   No  murmur heard. Pulmonary/Chest: Effort normal and breath sounds normal. No stridor. No respiratory distress. She has no decreased breath sounds. She has no wheezes. She has no rhonchi. She has no rales.  Abdominal: Soft. Normal appearance and bowel sounds are normal. She exhibits no distension. There is tenderness in the right lower quadrant and left lower quadrant. There is no rigidity, no rebound, no guarding and no CVA tenderness.    Urostomy and colostomy bags noted  Musculoskeletal: Normal range of motion. She exhibits no edema or tenderness.  Neurological: She is alert and oriented to person, place, and time. She has normal strength. No cranial nerve deficit or sensory deficit. GCS eye subscore is 4. GCS verbal subscore is 5. GCS motor subscore is 6.  Reflex Scores:      Patellar reflexes are 1+ on the right side and 1+ on the left side. Skin: Skin is warm and dry. No abrasion and no rash noted.  Psychiatric: Her affect is blunt. Her speech is delayed. She is slowed.  Nursing note and vitals reviewed.   ED Course  Procedures (including critical care time) Labs Review Labs Reviewed  URINE CULTURE  CBC WITH DIFFERENTIAL/PLATELET  COMPREHENSIVE METABOLIC PANEL  LIPASE, BLOOD  URINALYSIS, ROUTINE W REFLEX MICROSCOPIC (NOT AT [facility])    Imaging Review No results found. I have personally reviewed and evaluated these images and lab results as part of my medical decision-making.   EKG Interpretation   Date/Time:  Monday March 14 2016 23:22:51 EDT Ventricular Rate:  73 PR Interval:  132 QRS Duration: 117 QT Interval:  392 QTC Calculation: 432 R Axis:   50 Text Interpretation:  Sinus rhythm Nonspecific intraventricular conduction  delay Low voltage, precordial leads ST elevation, consider inferior injury  Confirmed by Keena Dinse  MD, Brayden Betters ([id number]) on 03/14/2016 11:36:16 PM      MDM   Final diagnoses:  None    Patient given pain meds and Zofran here as well as IV fluids. CT  scan shows evidence of infection is sitting of the ureters. Patient also with evidence of renal failure and hyperkalemia. Patient treated with Kayexalate, calcium, glucose, insulin. Urine culture sent. Discussed with urologist and patient to be admitted no evidence of hyperkalemia on EKG   CRITICAL CARE Performed by: Leota Jacobsen Total critical care time: 40  minutes Critical care time was exclusive of separately billable procedures and treating other patients. Critical care was necessary to treat or prevent imminent or life-threatening deterioration. Critical care was time spent personally by me on the following activities: development of treatment plan with patient and/or surrogate as well as nursing, discussions with consultants, evaluation of patient's response to treatment, examination of patient, obtaining history from patient or surrogate, ordering and performing treatments and interventions, ordering and review of laboratory studies, ordering and review of radiographic studies, pulse oximetry and re-evaluation of patient's condition. ]   Lacretia Leigh, MD 03/14/16 [phone]

## 2016-03-14 NOTE — ED Notes (Signed)
MD at bedside. 

## 2016-03-14 NOTE — ED Notes (Signed)
K= 7.3 reported to [employer]

## 2016-03-14 NOTE — ED Notes (Signed)
Bed: WA17 Expected date:  Expected time:  Means of arrival:  Comments: EMS- 70yo F, CA Pt/increased pain

## 2016-03-14 NOTE — ED Notes (Addendum)
Per EMS, patient has hx of colon and bladder cancers. Over the past 3-4 weeks, patient is having increased lower back/left side pain, patient also states she feels weaker. Home health nurse stated her oncologist wanted her to be evaluated.

## 2016-03-15 DIAGNOSIS — Z87891 Personal history of nicotine dependence: Secondary | ICD-10-CM | POA: Diagnosis not present

## 2016-03-15 DIAGNOSIS — R630 Anorexia: Secondary | ICD-10-CM | POA: Diagnosis present

## 2016-03-15 DIAGNOSIS — N368 Other specified disorders of urethra: Secondary | ICD-10-CM | POA: Diagnosis present

## 2016-03-15 DIAGNOSIS — Z85038 Personal history of other malignant neoplasm of large intestine: Secondary | ICD-10-CM | POA: Diagnosis not present

## 2016-03-15 DIAGNOSIS — Z6824 Body mass index (BMI) 24.0-24.9, adult: Secondary | ICD-10-CM | POA: Diagnosis not present

## 2016-03-15 DIAGNOSIS — Z8052 Family history of malignant neoplasm of bladder: Secondary | ICD-10-CM | POA: Diagnosis not present

## 2016-03-15 DIAGNOSIS — E860 Dehydration: Secondary | ICD-10-CM | POA: Diagnosis present

## 2016-03-15 DIAGNOSIS — Z7189 Other specified counseling: Secondary | ICD-10-CM | POA: Diagnosis not present

## 2016-03-15 DIAGNOSIS — E875 Hyperkalemia: Secondary | ICD-10-CM | POA: Diagnosis present

## 2016-03-15 DIAGNOSIS — Z515 Encounter for palliative care: Secondary | ICD-10-CM | POA: Diagnosis not present

## 2016-03-15 DIAGNOSIS — C679 Malignant neoplasm of bladder, unspecified: Secondary | ICD-10-CM | POA: Diagnosis not present

## 2016-03-15 DIAGNOSIS — E871 Hypo-osmolality and hyponatremia: Secondary | ICD-10-CM | POA: Diagnosis present

## 2016-03-15 DIAGNOSIS — R627 Adult failure to thrive: Secondary | ICD-10-CM | POA: Diagnosis present

## 2016-03-15 DIAGNOSIS — Z8711 Personal history of peptic ulcer disease: Secondary | ICD-10-CM | POA: Diagnosis not present

## 2016-03-15 DIAGNOSIS — C675 Malignant neoplasm of bladder neck: Secondary | ICD-10-CM | POA: Diagnosis not present

## 2016-03-15 DIAGNOSIS — Z880 Allergy status to penicillin: Secondary | ICD-10-CM | POA: Diagnosis not present

## 2016-03-15 DIAGNOSIS — R188 Other ascites: Secondary | ICD-10-CM | POA: Diagnosis present

## 2016-03-15 DIAGNOSIS — E119 Type 2 diabetes mellitus without complications: Secondary | ICD-10-CM | POA: Diagnosis present

## 2016-03-15 DIAGNOSIS — C775 Secondary and unspecified malignant neoplasm of intrapelvic lymph nodes: Secondary | ICD-10-CM | POA: Diagnosis present

## 2016-03-15 DIAGNOSIS — Z9049 Acquired absence of other specified parts of digestive tract: Secondary | ICD-10-CM | POA: Diagnosis not present

## 2016-03-15 DIAGNOSIS — N1330 Unspecified hydronephrosis: Secondary | ICD-10-CM | POA: Diagnosis present

## 2016-03-15 DIAGNOSIS — R531 Weakness: Secondary | ICD-10-CM | POA: Diagnosis present

## 2016-03-15 DIAGNOSIS — Z90710 Acquired absence of both cervix and uterus: Secondary | ICD-10-CM | POA: Diagnosis not present

## 2016-03-15 DIAGNOSIS — D72829 Elevated white blood cell count, unspecified: Secondary | ICD-10-CM | POA: Diagnosis present

## 2016-03-15 DIAGNOSIS — F4323 Adjustment disorder with mixed anxiety and depressed mood: Secondary | ICD-10-CM | POA: Diagnosis not present

## 2016-03-15 DIAGNOSIS — Z881 Allergy status to other antibiotic agents status: Secondary | ICD-10-CM | POA: Diagnosis not present

## 2016-03-15 DIAGNOSIS — Z808 Family history of malignant neoplasm of other organs or systems: Secondary | ICD-10-CM | POA: Diagnosis not present

## 2016-03-15 DIAGNOSIS — Z66 Do not resuscitate: Secondary | ICD-10-CM | POA: Diagnosis present

## 2016-03-15 DIAGNOSIS — Z882 Allergy status to sulfonamides status: Secondary | ICD-10-CM | POA: Diagnosis not present

## 2016-03-15 DIAGNOSIS — R64 Cachexia: Secondary | ICD-10-CM | POA: Diagnosis present

## 2016-03-15 DIAGNOSIS — Z906 Acquired absence of other parts of urinary tract: Secondary | ICD-10-CM | POA: Diagnosis not present

## 2016-03-15 DIAGNOSIS — M1990 Unspecified osteoarthritis, unspecified site: Secondary | ICD-10-CM | POA: Diagnosis present

## 2016-03-15 DIAGNOSIS — Z800 Family history of malignant neoplasm of digestive organs: Secondary | ICD-10-CM | POA: Diagnosis not present

## 2016-03-15 DIAGNOSIS — E7800 Pure hypercholesterolemia, unspecified: Secondary | ICD-10-CM | POA: Diagnosis present

## 2016-03-15 DIAGNOSIS — Z933 Colostomy status: Secondary | ICD-10-CM | POA: Diagnosis not present

## 2016-03-15 DIAGNOSIS — N179 Acute kidney failure, unspecified: Secondary | ICD-10-CM | POA: Diagnosis present

## 2016-03-15 DIAGNOSIS — Z79899 Other long term (current) drug therapy: Secondary | ICD-10-CM | POA: Diagnosis not present

## 2016-03-15 LAB — BASIC METABOLIC PANEL
Anion gap: 11 (ref 5–15)
BUN: 145 mg/dL — AB (ref 6–20)
CALCIUM: 9.6 mg/dL (ref 8.9–10.3)
CO2: 15 mmol/L — ABNORMAL LOW (ref 22–32)
Chloride: 98 mmol/L — ABNORMAL LOW (ref 101–111)
Creatinine, Ser: 3.04 mg/dL — ABNORMAL HIGH (ref 0.44–1.00)
GFR calc Af Amer: 17 mL/min — ABNORMAL LOW (ref >60)
GFR, EST NON AFRICAN AMERICAN: 15 mL/min — AB (ref >60)
GLUCOSE: 79 mg/dL (ref 65–99)
POTASSIUM: 6.8 mmol/L — AB (ref 3.5–5.1)
Sodium: 124 mmol/L — ABNORMAL LOW (ref 135–145)

## 2016-03-15 LAB — CBC
HEMATOCRIT: 31.9 % — AB (ref 36.0–46.0)
Hemoglobin: 10.7 g/dL — ABNORMAL LOW (ref 12.0–15.0)
MCH: 27.6 pg (ref 26.0–34.0)
MCHC: 33.5 g/dL (ref 30.0–36.0)
MCV: 82.4 fL (ref 78.0–100.0)
Platelets: 425 10*3/uL — ABNORMAL HIGH (ref 150–400)
RBC: 3.87 MIL/uL (ref 3.87–5.11)
RDW: 15 % (ref 11.5–15.5)
WBC: 20 10*3/uL — ABNORMAL HIGH (ref 4.0–10.5)

## 2016-03-15 LAB — MRSA PCR SCREENING: MRSA BY PCR: NEGATIVE

## 2016-03-15 MED ORDER — HYDROCODONE-ACETAMINOPHEN 5-325 MG PO TABS: 1.0000 | ORAL_TABLET | ORAL | Status: DC | PRN

## 2016-03-15 MED ORDER — MORPHINE SULFATE (PF) 2 MG/ML IV SOLN
2.0000 mg | INTRAVENOUS | Status: DC | PRN
  Administered 2016-03-15 (×3): 2 mg via INTRAVENOUS
  Filled 2016-03-15 (×3): qty 1

## 2016-03-15 MED ORDER — DEXTROSE 5 % IV SOLN
1.0000 g | Freq: Every day | INTRAVENOUS | Status: DC
  Administered 2016-03-15 – 2016-03-16 (×2): 1 g via INTRAVENOUS
  Filled 2016-03-15 (×2): qty 10

## 2016-03-15 MED ORDER — HYDROMORPHONE HCL 2 MG/ML IJ SOLN
INTRAMUSCULAR | Status: AC
  Administered 2016-03-15: 2 mg
  Filled 2016-03-15: qty 1

## 2016-03-15 MED ORDER — DOCUSATE SODIUM 100 MG PO CAPS
100.0000 mg | ORAL_CAPSULE | Freq: Two times a day (BID) | ORAL | Status: DC
  Filled 2016-03-15 (×4): qty 1

## 2016-03-15 MED ORDER — SODIUM CHLORIDE 0.9 % IV SOLN
INTRAVENOUS | Status: DC
  Administered 2016-03-15: 02:00:00 via INTRAVENOUS

## 2016-03-15 MED ORDER — OXYCODONE-ACETAMINOPHEN 5-325 MG PO TABS
1.0000 | ORAL_TABLET | Freq: Four times a day (QID) | ORAL | Status: DC | PRN
  Filled 2016-03-15: qty 2

## 2016-03-15 MED ORDER — ONDANSETRON HCL 4 MG/2ML IJ SOLN: 4.0000 mg | INTRAMUSCULAR | Status: DC | PRN

## 2016-03-15 MED ORDER — SULFAMETHOXAZOLE-TRIMETHOPRIM 800-160 MG PO TABS
1.0000 | ORAL_TABLET | Freq: Two times a day (BID) | ORAL | Status: DC
  Filled 2016-03-15 (×2): qty 1

## 2016-03-15 MED ORDER — HYDROMORPHONE HCL 1 MG/ML IJ SOLN
1.0000 mg | INTRAMUSCULAR | Status: DC | PRN
  Administered 2016-03-15 – 2016-03-16 (×3): 2 mg via INTRAVENOUS
  Filled 2016-03-15 (×3): qty 2

## 2016-03-15 MED ORDER — HEPARIN SODIUM (PORCINE) 5000 UNIT/ML IJ SOLN
5000.0000 [IU] | Freq: Three times a day (TID) | INTRAMUSCULAR | Status: DC
  Administered 2016-03-15: 5000 [IU] via SUBCUTANEOUS
  Filled 2016-03-15 (×3): qty 1

## 2016-03-15 MED ORDER — METRONIDAZOLE 500 MG PO TABS
500.0000 mg | ORAL_TABLET | Freq: Two times a day (BID) | ORAL | Status: DC
  Filled 2016-03-15 (×2): qty 1

## 2016-03-15 NOTE — Progress Notes (Signed)
Pharmacy Antibiotic Note  [name] is a 70 y.o. female admitted on 03/14/2016 with UTI.  Pharmacy has been consulted for Ceftriaxone dosing.  Plan: Ceftriaxone 1gm iv q24hr  Height: '5\' 5"'$  (165.1 cm) Weight: 145 lb (65.772 kg) IBW/kg (Calculated) : 57  Temp (24hrs), Avg:97.6 F (36.4 C), Min:97.3 F (36.3 C), Max:97.9 F (36.6 C)   Recent Labs Lab 03/14/16 2008 03/15/16 0514  WBC 18.9* 20.0*  CREATININE 3.62* 3.04*    Estimated Creatinine Clearance: 15.7 mL/min (by C-G formula based on Cr of 3.04).    Allergies  Allergen Reactions  . Ciprofloxacin Shortness Of Breath and Swelling  . Sulfa Antibiotics Rash  . Penicillins Hives    Has patient had a PCN reaction causing immediate rash, facial/tongue/throat swelling, SOB or lightheadedness with hypotension: No Has patient had a PCN reaction causing severe rash involving mucus membranes or skin necrosis: No Has patient had a PCN reaction that required hospitalization Yes Has patient had a PCN reaction occurring within the last 10 years: No If all of the above answers are "NO", then may proceed with Cephalosporin use.    Antimicrobials this admission: Ceftriaxone 3/28 >>  Flagyl  >> 3/28 Bactrim >> 3/28  Thank you for allowing pharmacy to be a part of this patient's care.  Nani Skillern Crowford 03/15/2016 7:01 AM

## 2016-03-15 NOTE — Progress Notes (Signed)
CRITICAL VALUE ALERT  Critical value received: K 6.8  Date of notification:  03/15/16  Time of notification:  6:06am  Critical value read back:Yes.    Nurse who received alert:  Ellen Henri  MD notified (1st page):  Dr Pilar Jarvis  Time of first page:  6:23  MD notified (2nd page):n/a  Time of second page:n/a  Responding MD:  Dr Pilar Jarvis  Time MD responded:  6:46

## 2016-03-15 NOTE — Progress Notes (Signed)
Subjective:  1 - Stage 4 Bladder Cancer - s/p open cystectomy / TAH+BSO / colostomy / colon conduit 01/27/2016 in combined Urol and Gen surg procedure. Path pT4N2.   2 - Urinoma / Urine Leak / Renal Failure - new large urinoma on admission CT 2/23 with aparant leak from butt end of colon conduit / Rt ureteral anastamosis. Some dilation of butt end of conduit, stents in adequate position. 60F foley placed across distal conduit (past abd fascia) 2/27 but still all UOP via urinoma drain. Failed multiple attempts of urinoma drain clamp and persistant large leak by CT on admission 3/28. New hyperkalemia >7 and ARF with Cr >3.   3 -  Disposition / Goals Of Care - Pt in / out of hospital for several months and failing to maintain hydration / PO nutrition and continued worsening metabolic derangement, she now desires palliative only approach.   Today "Dashonda" is feeling very weak. She is tired of in / out of hospital.   Objective: Vital signs in last 24 hours: Temp:  [97.3 F (36.3 C)-97.9 F (36.6 C)] 97.8 F (36.6 C) (03/28 0536) Pulse Rate:  [70-84] 84 (03/28 0536) Resp:  [10-20] 18 (03/28 0536) BP: (99-137)/(49-63) 117/49 mmHg (03/28 0536) SpO2:  [96 %-99 %] 98 % (03/28 0536) Weight:  [65.772 kg (145 lb)] 65.772 kg (145 lb) (03/27 1828)    Intake/Output from previous day: 03/27 0701 - 03/28 0700 In: -  Out: 875 [Urine:875] Intake/Output this shift:    NAD, very weak, AOx3 Stigmata of weight loss Hiccups during exam RLQ Urostomy patent with distal urostomy tube LLQ colsotomy patent with partially formed stool LLQ abdominal drian patent with uriniferous fluid NO C/C/E  Lab Results:   Recent Labs  03/14/16 2008 03/15/16 0514  WBC 18.9* 20.0*  HGB 10.3* 10.7*  HCT 29.9* 31.9*  PLT 405* 425*   BMET  Recent Labs  03/14/16 2008 03/15/16 0514  NA 123* 124*  K 7.3* 6.8*  CL 96* 98*  CO2 15* 15*  GLUCOSE 125* 79  BUN 162* 145*  CREATININE 3.62* 3.04*  CALCIUM 8.8*  9.6   PT/INR No results for input(s): LABPROT, INR in the last 72 hours. ABG No results for input(s): PHART, HCO3 in the last 72 hours.  Invalid input(s): PCO2, PO2  Studies/Results: Ct Abdomen Pelvis W Contrast  03/14/2016  CLINICAL DATA:  Status post cystectomy, hysterectomy, ileal conduit urinary diversion and end colostomy on 01/27/2016 for bladder cancer complicated by a left ureteral leak requiring percutaneous drainage on 02/11/2016, now presenting with weakness and worsening left abdominal/ low back pain. EXAM: CT ABDOMEN AND PELVIS WITH CONTRAST TECHNIQUE: Multidetector CT imaging of the abdomen and pelvis was performed using the standard protocol following bolus administration of intravenous contrast. CONTRAST:  130m ISOVUE-300 IOPAMIDOL (ISOVUE-300) INJECTION 61% COMPARISON:  02/10/2016 CT abdomen/pelvis. FINDINGS: Lower chest: No significant pulmonary nodules or acute consolidative airspace disease. Oral contrast in the lower thoracic esophagus. Hepatobiliary: Normal liver with no liver mass. Normal gallbladder with no radiopaque cholelithiasis. Mild diffuse intrahepatic biliary ductal dilatation and dilated common bile duct (9 mm diameter), mildly increased since 02/10/2016. No radiopaque choledocholithiasis. No appreciable obstructing biliary mass. Pancreas: Normal, with no mass or duct dilation. Spleen: Normal size. No mass. Adrenals/Urinary Tract: Normal adrenals. The patient is status post cystectomy with ventral right abdominal ileal conduit urinary diversion. There is moderate right hydronephrosis, increased. There is mild left hydronephrosis, unchanged. The bilateral nephroureteral stents appear well positioned with the proximal right stent pigtail  in the right renal pelvis in the proximal left stent pigtail in the interpolar left renal collecting system. There is gas in the left renal pelvis and left renal collecting system. There is mild abnormal urothelial wall thickening and  hyperenhancement throughout the bilateral renal collecting systems and ureters. The ileal conduit is collapsed with indwelling Malecot catheter. There is apparent frank dehiscence of the distal wall of the ileal conduit near the anastomosis of the left ureter with ileal conduit (series 3/ image 33). There is a persistent crescent-shaped gas containing fluid collection in the left lower retroperitoneum measuring 7.6 x 6.4 x 9.2 cm, which demonstrates wall thickening and wall hyperenhancement, decreased from 14.7 x 10.5 x 15.5 cm on 02/10/2016. The ventral left pelvic percutaneous drainage catheter terminates at the posterior margin of this collection. Stomach/Bowel: Grossly normal stomach. Normal caliber small bowel with no small bowel wall thickening. Status post distal colectomy with end sigmoid colostomy in the ventral left abdominal wall. No wall thickening or pericolonic fat stranding in the remnant colon. The blind-ending rectum contains a small amount of fluid without appreciable rectal wall thickening. Vascular/Lymphatic: Atherosclerotic nonaneurysmal abdominal aorta. Patent portal, splenic, hepatic and renal veins. No pathologically enlarged lymph nodes in the abdomen or pelvis. Reproductive: Status post hysterectomy, with no abnormal findings at the vaginal cuff. No adnexal mass. Other: No pneumoperitoneum or ascites. Musculoskeletal: No aggressive appearing focal osseous lesions. Severe degenerative changes at L3-4, not appreciably changed. IMPRESSION: 1. Cystectomy with ileal conduit urinary diversion. Apparent frank dehiscence of the distal ileal conduit wall near the anastomosis with the left ureter. 2. Persistent gas containing left retroperitoneal fluid collection with thick enhancing wall located adjacent to the tip of the ileal conduit, most in keeping with an infected urinoma. Percutaneous left pelvic drainage catheter terminates at the posterior margin of this collection. 3. Moderate right  hydronephrosis, increased, and mild left hydronephrosis, stable, despite well-positioned bilateral nephroureteral stents. Gas in the left renal collecting system and thick enhancing urothelial walls in the bilateral ureters and renal collecting systems suggests ascending bilateral urinary tract infection. 4. Mild intrahepatic and common bile duct dilatation, slightly worsened since 02/10/2016. No radiopaque choledocholithiasis. Recommend correlation with serum bilirubin levels. If there is clinical concern for biliary obstruction, recommend further evaluation with MRI abdomen without and with IV contrast with MRCP. 5. Oral contrast in the lower thoracic esophagus, suggesting esophageal dysmotility and/or gastroesophageal reflux. Electronically Signed   By: Ilona Sorrel M.D.   On: 03/14/2016 21:38    Anti-infectives: Anti-infectives    Start     Dose/Rate Route Frequency Ordered Stop   03/15/16 0715  cefTRIAXone (ROCEPHIN) 1 g in dextrose 5 % 50 mL IVPB     1 g 100 mL/hr over 30 Minutes Intravenous Daily 03/15/16 0700     03/15/16 0030  metroNIDAZOLE (FLAGYL) tablet 500 mg  Status:  Discontinued     500 mg Oral 2 times daily 03/15/16 0021 03/15/16 0653   03/15/16 0030  sulfamethoxazole-trimethoprim (BACTRIM DS,SEPTRA DS) 800-160 MG per tablet 1 tablet  Status:  Discontinued     1 tablet Oral 2 times daily 03/15/16 0021 03/15/16 0647      Assessment/Plan:  1 - Stage 4 Bladder Cancer - prognosis quite poor from oncologic perspective, with her current level of function and metabolic derangement she would unlikely become candidate for additional cancer-directed therapy (chemo).   2 - Urinoma / Urine Leak / Renal Failure - not responded to several attempts at tube drainage and appears to be  worsening by imaging this admssion. Discussed option of bilateral nephrostomy tube placement with goal of diverting urine above leaks, but she now desires no more procedural intervention.   3 -  Disposition /  Goals Of Care - Overall prognosis quite guarded both from her current metabolic derangement / renal failure / internal urine leaks as well as oncolgocally. Discussed agressive course with nutritional support / PICC / bilateral nephrostomy tube placement / goal of eventual rehab admission versus transitioning to palliative management. She adamantly wants palliative transition and I feel this is completely reasonable.   DNR, Regular diet, stop daily labs / telemetry, no further tubes / lines / radiology studies. Discussed with nursing who is aware of palliative transition as well as palliative care team who will see patient today and likely help with inpatient hospice referral.    Alexis Frock 03/15/2016

## 2016-03-15 NOTE — Progress Notes (Signed)
[facility]  Patient Status: Active (receiving services up to time of hospitalization)  [facility] is providing the following services: RN  If patient discharges after hours, please call [phone].   [name] 03/15/2016, 12:17 PM

## 2016-03-15 NOTE — Progress Notes (Signed)
Nutrition Brief Note  Pt identified on MST screen report. Chart reviewed. Pt now transitioning to comfort care. Pt wit hx of Stage 4 bladder cancer, per urology note this AM: s/p open cystectomy / TAH+BSO / colostomy / colon conduit 01/27/2016 in combined Urol and Gen surg procedure. Path pT4N2.   No further nutrition interventions warranted at this time.  Please re-consult as needed.     Jarome Matin, RD, LDN Inpatient Clinical Dietitian Pager # [phone] After hours/weekend pager # [phone]

## 2016-03-15 NOTE — H&P (Signed)
Chief Complaint Weakness   History of Present Illness   1 - Stage 4 Bladder Cancer - s/p open cystectomy / TAH+BSO / colostomy / colon conduit 01/27/2016 in combined Urol and Gen surg procedure. Path pT4N2 with negative margins.     2 - Urinoma / Urine Leak - new large urinoma on admission CT 2/23 with aparant leak from butt end of colon conduit / Rt ureteral anastamosis. 47F foley placed across distal conduit (past abd fascia) and resolved urinoma output / all output antegrade via conduit. Loopogram 03/03/16 with continued butt end leak.     3 - Bacteruria - UCX 2/23 from urinoma with e. Coli and bacteriodes. On primaxin IV initially, transitioned to PO bactrim + Flagyl 3/2.     PMH sig for colon resection with primary re-anastamosis for colon cancer (post-op adjuvant chemo, currently NED and compliant with colonoscopies, infraumbilical scar) Ortho surgery. Still has uterus / ovaries. She is quite active and avid golfer with severl of her girlfriends, one who had prior cystectomy. Her PCP is Dr. Quillian Quince with [facility] in [place].   Interval History: The patient returns to the hospital with extreme weakness. She is unable to tolerate diet and is eating very little at home. She also reports that she has significant drainage of urine from around her capped abdominal drain. She reports that approximately half of her urine output comes from around the drain. She notes good stool and gas in her colostomy.  In the emergency room, she underwent a CT scan. This showed continued leakage from the butt end of the ileal conduit. It also showed a fluid collection around the capped drain that was concerning for infected urinoma. She also had bilateral hydronephrosis with nephroureteral stents in place. There is a small a lot of gas in the collecting system.  She was also found to be in renal failure with a creatinine of approximately 3.6 with a baseline of less than 1. Potassium was 7.3. She was given insulin  and dextrose as well as Kayexalate to correct this.    Today " [name] " is seen in f/u above and loopogram. No interval colon conduit or colostomy problems.    Past Medical History Problems  1. History of arthritis (Z87.39) 2. History of diabetes mellitus (Z86.39) 3. History of gastric ulcer (Z87.19) 4. History of hypercholesterolemia (Z86.39) 5. History of malignant neoplasm of colon (Y19.509)  Surgical History Problems  1. History of Knee Surgery 2. History of Partial Colectomy 3. History of Pelvic Exenteration For Malignancy 4. History of Shoulder Surgery 5. History of Spine Excision  Current Meds 1. Isoniazid 300 MG Oral Tablet;  Therapy: (Recorded:19Dec2016) to Recorded  Allergies Medication  1. Cipro TABS 2. Penicillins  Family History Problems  1. Family history of Bone cancer : Brother 2. Family history of Death of family member : Father   father deceased at age 77; cancer 71. Family history of colon cancer (Z80.0) : Mother, Sister 4. Family history of malignant neoplasm of urinary bladder (Z80.52) : Brother  Social History Problems  1. Alcohol use (Z78.9)   2/month 2. Caffeine use (F15.90)   1 3. Former smoker [phone])   smoked for 45 yrs & quit in 2013 4. Married 5. Retired  Review of Systems Genitourinary, constitutional, skin, eye, otolaryngeal, hematologic/lymphatic, cardiovascular, pulmonary, endocrine, musculoskeletal, gastrointestinal, neurological and psychiatric system(s) were reviewed and pertinent findings if present are noted and are otherwise negative.    Physical Exam Constitutional: Well nourished and well developed . No acute  distress.  ENT:. The ears and nose are normal in appearance.  Neck: The appearance of the neck is normal and no neck mass is present.  Pulmonary: No respiratory distress and normal respiratory rhythm and effort.  Cardiovascular: Heart rate and rhythm are normal . No peripheral edema.  Abdomen: LLQ colostomy  patent of stool and gas. RLQ Urostomy patent of yellow urine with Red (rigth) and blue (left) bander stents and distal end of conduit foley in situ. Prior incisions c/d/i without hernias.  Genitourinary:. No CVAT.   Lymphatics: The femoral and inguinal nodes are not enlarged or tender.  Skin: Normal skin turgor, no visible rash and no visible skin lesions.  Neuro/Psych:. Mood and affect are appropriate.    Procedure   CLINICAL DATA: Status post cystectomy, hysterectomy, ileal conduit urinary diversion and end colostomy on 01/27/2016 for bladder cancer complicated by a left ureteral leak requiring percutaneous drainage on 02/11/2016, now presenting with weakness and worsening left abdominal/ low back pain.  EXAM: CT ABDOMEN AND PELVIS WITH CONTRAST  TECHNIQUE: Multidetector CT imaging of the abdomen and pelvis was performed using the standard protocol following bolus administration of intravenous contrast.  CONTRAST: 134m ISOVUE-300 IOPAMIDOL (ISOVUE-300) INJECTION 61%  COMPARISON: 02/10/2016 CT abdomen/pelvis.  FINDINGS: Lower chest: No significant pulmonary nodules or acute consolidative airspace disease. Oral contrast in the lower thoracic esophagus.  Hepatobiliary: Normal liver with no liver mass. Normal gallbladder with no radiopaque cholelithiasis. Mild diffuse intrahepatic biliary ductal dilatation and dilated common bile duct (9 mm diameter), mildly increased since 02/10/2016. No radiopaque choledocholithiasis. No appreciable obstructing biliary mass.  Pancreas: Normal, with no mass or duct dilation.  Spleen: Normal size. No mass.  Adrenals/Urinary Tract: Normal adrenals. The patient is status post cystectomy with ventral right abdominal ileal conduit urinary diversion. There is moderate right hydronephrosis, increased. There is mild left hydronephrosis, unchanged. The bilateral nephroureteral stents appear well positioned with the proximal right  stent pigtail in the right renal pelvis in the proximal left stent pigtail in the interpolar left renal collecting system. There is gas in the left renal pelvis and left renal collecting system. There is mild abnormal urothelial wall thickening and hyperenhancement throughout the bilateral renal collecting systems and ureters. The ileal conduit is collapsed with indwelling Malecot catheter. There is apparent frank dehiscence of the distal wall of the ileal conduit near the anastomosis of the left ureter with ileal conduit (series 3/ image 33). There is a persistent crescent-shaped gas containing fluid collection in the left lower retroperitoneum measuring 7.6 x 6.4 x 9.2 cm, which demonstrates wall thickening and wall hyperenhancement, decreased from 14.7 x 10.5 x 15.5 cm on 02/10/2016. The ventral left pelvic percutaneous drainage catheter terminates at the posterior margin of this collection.  Stomach/Bowel: Grossly normal stomach. Normal caliber small bowel with no small bowel wall thickening. Status post distal colectomy with end sigmoid colostomy in the ventral left abdominal wall. No wall thickening or pericolonic fat stranding in the remnant colon. The blind-ending rectum contains a small amount of fluid without appreciable rectal wall thickening.  Vascular/Lymphatic: Atherosclerotic nonaneurysmal abdominal aorta. Patent portal, splenic, hepatic and renal veins. No pathologically enlarged lymph nodes in the abdomen or pelvis.  Reproductive: Status post hysterectomy, with no abnormal findings at the vaginal cuff. No adnexal mass.  Other: No pneumoperitoneum or ascites.  Musculoskeletal: No aggressive appearing focal osseous lesions. Severe degenerative changes at L3-4, not appreciably changed.  IMPRESSION: 1. Cystectomy with ileal conduit urinary diversion. Apparent frank dehiscence of the distal ileal  conduit wall near the anastomosis with the left ureter. 2.  Persistent gas containing left retroperitoneal fluid collection with thick enhancing wall located adjacent to the tip of the ileal conduit, most in keeping with an infected urinoma. Percutaneous left pelvic drainage catheter terminates at the posterior margin of this collection. 3. Moderate right hydronephrosis, increased, and mild left hydronephrosis, stable, despite well-positioned bilateral nephroureteral stents. Gas in the left renal collecting system and thick enhancing urothelial walls in the bilateral ureters and renal collecting systems suggests ascending bilateral urinary tract infection. 4. Mild intrahepatic and common bile duct dilatation, slightly worsened since 02/10/2016. No radiopaque choledocholithiasis. Recommend correlation with serum bilirubin levels. If there is clinical concern for biliary obstruction, recommend further evaluation with MRI abdomen without and with IV contrast with MRCP. 5. Oral contrast in the lower thoracic esophagus, suggesting esophageal dysmotility and/or gastroesophageal reflux.      Assessment Assessed  1. 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 S/P ileal conduit (Z93.2) 4. Intra-abdominal fluid collection (R18.8)  Plan Cancer of bladder neck  1. Follow-up Weeks 4-6 Office  Follow-up  Status: Hold For - Appointment,Date of Service   Requested for: [id number] Intra-abdominal fluid collection  2. NEPHROSTOGRAM/LOOPOGRAM; Status:Hold For - Appointment,Date of Service;  Requested for:16Mar2017;   Discussion/Summary   1 - Stage 4 Bladder Cancer - will consider adjuvant chemo at 33mo+ surgery when back to baseline pending restaging imaging and clinical status.   2 - Infected Urinoma / Urine Leak - patient's abdominal drain was uncapped bedside. There was immediate return of foul-smelling fluid. This was sent for culture. We will leave the patient's abdominal drain uncapped for now. She was  prophylactically started on Bactrim and Flagyl which is what she was on during her last admission  3 - AKI with hyperkalemia - the patient was given Kayexalate as well as insulin and glucose. Will recheck her electrolytes in the morning. May consider nephrology consult if hyperkalemia persists.  4. Leukocytosis - will monitor  5. Dehydration - will gently rehydrate overnight  6. Poor PO intake - will defer decisions on nutrition supplementation to Dr. MTresa Moorein the morning   Will update Dr. MTresa Mooreon his patient's admission in the morning.

## 2016-03-15 NOTE — Progress Notes (Signed)
MD on call notified of critical potasium level and order given to give 1 amp D5 and 10 units  novolog insulin at 6:53am Dr Tresa Moore @ patient bedside @ this time gave an order to stop any further med.

## 2016-03-15 NOTE — Care Management Note (Signed)
Case Management Note  Patient Details  Name: BRINDLEY MADARANG MRN: [id number] Date of Birth: 03-Sep-1946  Subjective/Objective: 70 y/o f admitted w/Bladder Ca. From home. DNR. Palliative cons.                   Action/Plan:d/c plan home.   Expected Discharge Date:                  Expected Discharge Plan:  Home/Self Care  In-House Referral:  Clinical Social Work  Discharge planning Services  CM Consult  Post Acute Care Choice:    Choice offered to:     DME Arranged:    DME Agency:     HH Arranged:    HH Agency:     Status of Service:  In process, will continue to follow  Medicare Important Message Given:    Date Medicare IM Given:    Medicare IM give by:    Date Additional Medicare IM Given:    Additional Medicare Important Message give by:     If discussed at [place] of Stay Meetings, dates discussed:    Additional Comments:  Dessa Phi, RN 03/15/2016, 3:18 PM

## 2016-03-16 DIAGNOSIS — C679 Malignant neoplasm of bladder, unspecified: Secondary | ICD-10-CM

## 2016-03-16 DIAGNOSIS — Z515 Encounter for palliative care: Secondary | ICD-10-CM

## 2016-03-16 DIAGNOSIS — L8990 Pressure ulcer of unspecified site, unspecified stage: Secondary | ICD-10-CM | POA: Insufficient documentation

## 2016-03-16 DIAGNOSIS — Z7189 Other specified counseling: Secondary | ICD-10-CM | POA: Insufficient documentation

## 2016-03-16 DIAGNOSIS — F4323 Adjustment disorder with mixed anxiety and depressed mood: Secondary | ICD-10-CM | POA: Insufficient documentation

## 2016-03-16 MED ORDER — LORAZEPAM 2 MG/ML IJ SOLN: 1.0000 mg | INTRAMUSCULAR | Status: DC | PRN

## 2016-03-16 NOTE — Clinical Documentation Improvement (Signed)
Palliative Care  Abnormal Lab/Test Results:  03/14/16: Na= 123; 03/15/16: Na= 124  Possible Clinical Conditions associated with below indicators  Hyponatremia  Other Condition  Cannot Clinically Determine   Please exercise your independent, professional judgment when responding. A specific answer is not anticipated or expected.   Thank You,  Rolm Gala, RN, [place] [phone]

## 2016-03-16 NOTE — Progress Notes (Signed)
CSW received call from Dr. Rowe Pavy that patient/family are requesting [facility]. CSW faxed over referral, awaiting call back re: bed availability/eligibility.    Raynaldo Opitz, [facility] Clinical Social Worker cell #: [phone]

## 2016-03-16 NOTE — Progress Notes (Signed)
Patient is set to discharge to [facility] today. Patient & husband at bedside aware. Discharge packet given to RN, Katharine Look. [facility] called for transport to pickup at 12:45pm.     Raynaldo Opitz, [place] Worker cell #: [phone]

## 2016-03-16 NOTE — Consult Note (Signed)
Consultation Note Date: 03/16/2016   Patient Name: [name]  DOB: Feb 17, 1946  MRN: [id number]  Age / Sex: 70 y.o., female  PCP: Provider Default, MD Referring Physician: Alexis Frock, MD  Reason for Consultation: Establishing goals of [name]  Life limiting illness: bladder cancer.   Clinical Assessment/Narrative:  [name] is a 70 yo lady who lives in [place], [place] with her husband. She has a past medical history significant for Stage 4 Bladder Cancer s/p open cystectomy / TAH+BSO / colostomy / colon conduit 01/27/2016 in combined Urol and Gen surg procedure. Path pT4N2. Unfortunately, she has had recurrent hospitalizations recently, with ongoing decline. She has had possible urinoma, recurrent interventions done. She continued to have worsening of her nutrition and metabolic state.   Upon further discussions between the patient, her husband and her urologist Dr Tresa Moore, a palliative approach was deemed most appropriate. Her code status was revised to DNR DNI, increased focus was given on pain management with opioids, routine labs were d/c and [name] was consulted.   Patient is resting in bed, she is pale, weak. She appears anxious. Husband at the bedside. Patient states that her husband cannot take [name] of her at home. She does not desire going home. Introduced [employer] and level of [name]. Patient and husband are agreeable.   Hospice choice initiated, patient is from [place], [place]. Mr Chyrel Taha states that he has looked up contact information for [name]. Discussed about appropriate symptom management, patient states that her pain is well controlled on current regimen, but she has "shakes" and anxiety, she is not able to even hold her own cup of water. Discussed about adding low dose Ativan IV PRN, patient is agreeable.   Call placed and discussed with Claiborne Billings SW who will contact [facility] today. Anticipate d/c to residential hospice when bed available.   Offered extensive supportive [name] and active listening, both patient and husband are overwhelmed with the extent, severity and rapidity of the patient's decline process. [name].    Contacts/Participants in Discussion: Primary Decision Maker:     Relationship to Patient  Husband [name] cell [address]: yes     SUMMARY OF RECOMMENDATIONS: DNR DNI Prognosis likely less than 2 weeks Residential hospice- patient is from [place], [place]. Patient and husband elect [facility]. Patient's husband has looked up contact numbers and [facility].  Agree with iv dilaudid prn for pain Add low dose iv ativan prn anxiety Cancer related cachexia-anorexia: patient with essentially minimal to nil PO intake, continue with oral [name], sips of fluids as tolerated by the patient.   Thank you for the consult.    Code Status/Advance [name] Planning: DNR    Code Status Orders        Start     Ordered   03/15/16 0730  Do not attempt resuscitation (DNR)   Continuous    Question Answer Comment  In the event of cardiac or respiratory ARREST Do not call a "code blue"   In the event of cardiac or respiratory ARREST Do not perform Intubation, CPR, defibrillation or ACLS   In the event of cardiac or respiratory ARREST Use medication by any route, position, wound [name], and other measures to relive pain and suffering. Alexa use oxygen, suction and manual treatment  of airway obstruction as needed for comfort.      03/15/16 0730    Code Status History    Date Active Date Inactive Code Status Order ID Comments User Context   03/15/2016 12:19 AM 03/15/2016  7:30 AM Full Code [id number]  Nickie Retort, MD ED   02/10/2016  3:11 PM 02/22/2016  5:28 PM  Full Code [id number]  Erby Pian, NP ED    Advance Directive Documentation        Most Recent Value   Type of Advance Directive  Living will, Healthcare Power of Attorney   Pre-existing out of facility DNR order (yellow form or pink MOST form)     "MOST" Form in Place?        Other Directives:Other  Symptom Management:    as above   Palliative Prophylaxis:   Delirium Protocol  Additional Recommendations (Limitations, Scope, Preferences):  Full Comfort [name]     Psycho-social/Spiritual:  Support System: [place] for further Chaplaincy support:no Additional Recommendations: Caregiving  Support/Resources  Prognosis: < 2 weeks  Discharge Planning: Hospice facility   Chief Complaint/ Primary Diagnoses: Present on Admission:  . Bladder cancer ([place])  I have reviewed the medical record, interviewed the patient and family, and examined the patient. The following aspects are pertinent.  Past Medical History  Diagnosis Date  . Bladder cancer ([place])     Bladder and Colon 2014  . Colon cancer [facility])    Social History   Social History  . Marital Status: Married    Spouse Name: N/A  . Number of Children: N/A  . Years of Education: N/A   Social History Main Topics  . Smoking status: Former Smoker    Quit date: 12/19/2012  . Smokeless tobacco: Never Used  . Alcohol Use: No  . Drug Use: No  . Sexual Activity: Yes    Birth Control/ Protection: None   Other Topics Concern  . None   Social History Narrative   No family history on file. Scheduled Meds: . cefTRIAXone (ROCEPHIN)  IV  1 g Intravenous Daily  . docusate sodium  100 mg Oral BID  . sodium polystyrene  30 g Oral Once   Continuous Infusions: . sodium chloride 100 mL/hr at 03/16/16 0542   PRN Meds:.HYDROmorphone (DILAUDID) injection, LORazepam, ondansetron, oxyCODONE-acetaminophen Medications Prior to Admission:  Prior to Admission medications   Medication Sig Start Date End Date Taking?  Authorizing Provider  metroNIDAZOLE (FLAGYL) 500 MG tablet Take 1 tablet (500 mg total) by mouth 2 (two) times daily. Patient not taking: Reported on 03/14/2016 02/22/16   Alexis Frock, MD  oxyCODONE-acetaminophen (PERCOCET/ROXICET) 5-325 MG tablet Take 1-2 tablets by mouth every 6 (six) hours as needed for severe pain. Post-operatively Patient not taking: Reported on 03/14/2016 02/05/16   Alexis Frock, MD  sulfamethoxazole-trimethoprim (BACTRIM DS,SEPTRA DS) 800-160 MG tablet Take 1 tablet by mouth 2 (two) times daily. Patient not taking: Reported on 03/14/2016 02/22/16   Alexis Frock, MD   Allergies  Allergen Reactions  . Ciprofloxacin Shortness Of Breath and Swelling  . Sulfa Antibiotics Rash  . Penicillins Hives    Has patient had a PCN reaction causing immediate rash, facial/tongue/throat swelling, SOB or lightheadedness with hypotension: No Has patient had a PCN reaction causing severe rash involving mucus membranes or skin necrosis: No Has patient had a PCN reaction that required hospitalization Yes Has patient had a PCN reaction occurring within the last 10 years: No If all of the above answers are "NO", then Alexa  proceed with Cephalosporin use.    Review of Systems + for weakness, anorexia, pain generalized, anxiety.   Physical Exam Pale weak, chronically ill appearing lady,  Shallow breathing clear S1 S2 Abdomen generalized mild pain, has colostomy No edema Awake alert, anxious   Lab Results:    Vital Signs: BP 124/55 mmHg  Pulse 86  Temp(Src) 97.6 F (36.4 C) (Oral)  Resp 18  Ht '5\' 5"'$  (1.651 m)  Wt 65.772 kg (145 lb)  BMI 24.13 kg/m2  SpO2 99%  SpO2: SpO2: 99 % O2 Device:SpO2: 99 % O2 Flow Rate: .   IO: Intake/output summary:   Intake/Output Summary (Last 24 hours) at 03/16/16 0957 Last data filed at 03/16/16 7425  Gross per 24 hour  Intake   2385 ml  Output   1000 ml  Net   1385 ml    LBM:   Baseline Weight: Weight: 65.772 kg (145 lb) Most recent  weight: Weight: 65.772 kg (145 lb)      Palliative Assessment/Data:  Flowsheet Rows        Most Recent Value   Intake Tab    Referral Department  -- [urology]   Unit at Time of Referral  Oncology Unit   [name] Primary Diagnosis  Cancer   [name] Type  New [name]   Reason for referral  Clarify Goals of [name], Counsel Regarding Hospice   Date first seen by [name]  03/16/16   Clinical Assessment    Palliative Performance Scale Score  30%   Pain Max last 24 hours  7   Pain Min Last 24 hours  6   Dyspnea Max Last 24 Hours  5   Dyspnea Min Last 24 hours  4   Anxiety Max Last 24 Hours  8   Anxiety Min Last 24 Hours  7   Psychosocial & Spiritual Assessment    [name] Outcomes    Patient/Family meeting held?  Yes   Who was at the meeting?  patient and husband.    [name] Outcomes  Clarified goals of [name], Counseled regarding hospice   [name] follow-up planned  Yes, Facility      Additional Data Reviewed:  CBC:    Component Value Date/Time   WBC 20.0* 03/15/2016 0514   HGB 10.7* 03/15/2016 0514   HCT 31.9* 03/15/2016 0514   PLT 425* 03/15/2016 0514   MCV 82.4 03/15/2016 0514   NEUTROABS 16.2* 03/14/2016 2008   LYMPHSABS 1.1 03/14/2016 2008   MONOABS 1.4* 03/14/2016 2008   EOSABS 0.1 03/14/2016 2008   BASOSABS 0.0 03/14/2016 2008   Comprehensive Metabolic Panel:    Component Value Date/Time   NA 124* 03/15/2016 0514   K 6.8* 03/15/2016 0514   CL 98* 03/15/2016 0514   CO2 15* 03/15/2016 0514   BUN 145* 03/15/2016 0514   CREATININE 3.04* 03/15/2016 0514   GLUCOSE 79 03/15/2016 0514   CALCIUM 9.6 03/15/2016 0514   AST 17 03/14/2016 2008   ALT 44 03/14/2016 2008   ALKPHOS 116 03/14/2016 2008   BILITOT 0.7 03/14/2016 2008   PROT 6.4* 03/14/2016 2008   ALBUMIN 2.8* 03/14/2016 2008     Time In: 840 Time Out: 940 Time Total: 60 min  Greater than 50%  of this time was spent counseling and coordinating [name]  related to the above assessment and plan.  Signed by: Loistine Chance, MD [place], MD  03/16/2016, 9:57 AM  Please contact Palliative Medicine Team  phone at [phone] for questions and concerns.

## 2016-03-16 NOTE — Care Management Note (Signed)
Case Management Note  Patient Details  Name: [name] MRN: [id number] Date of Birth: 10-03-1946  Subjective/Objective:                    Action/Plan:d/c residential hospice.   Expected Discharge Date:                  Expected Discharge Plan:  [place]  In-House Referral:  Clinical Social Work  Discharge planning Services  CM Consult  Post Acute Care Choice:    Choice offered to:     DME Arranged:    DME Agency:     HH Arranged:    [place] Agency:     Status of Service:  Completed, signed off  Medicare Important Message Given:    Date Medicare IM Given:    Medicare IM give by:    Date Additional Medicare IM Given:    Additional Medicare Important Message give by:     If discussed at [place] of Stay Meetings, dates discussed:    Additional Comments:  Dessa Phi, RN 03/16/2016, 12:12 PM

## 2016-03-16 NOTE — Discharge Summary (Signed)
Physician Discharge Summary  Patient ID: [name] MRN: [id number] DOB/AGE: 04-13-1946 70 y.o.  Admit date: 03/14/2016 Discharge date: 03/16/2016  Admission Diagnoses: Bladder Cancer, Failure to Thrive, Renal Failure  Discharge Diagnoses:  Active Problems:   Bladder cancer ([place])   Encounter for palliative care   Goals of care, counseling/discussion   Adjustment disorder with mixed anxiety and depressed mood   Pressure ulcer   Discharged Condition: poor  Hospital Course:   1 - Stage 4 Bladder Cancer - s/p open cystectomy / TAH+BSO / colostomy / colon conduit 01/27/2016 in combined Urol and Gen surg procedure. Path pT4N2, metastatic disease. Not candidate for further cancer directed therapy.  2 - Urinoma / Urine Leak / Renal Failure - new large urinoma by post-op imaging 2/23-17 with aparant leak from butt end of colon conduit / Rt ureteral anastamosis. Some dilation of butt end of conduit, stents in adequate position. 52F foley placed across distal conduit (past abd fascia) 2/27 but still all UOP via urinoma drain. Failed multiple attempts of urinoma drain clamp and persistant large leak by CT on admission 3/28. New hyperkalemia >7 and ARF with Cr >3.   3 - Disposition / Goals Of Care - Pt in / out of hospital for several months and failing to maintain hydration / PO nutrition and continued worsening metabolic derangement. Admitted 03/15/16 with progressive failure to thrive and found to be in progressive renal failure and further progression of likely internal dehiscance of GU-GI anastamoses. After discussion of various management options including very aggressive approach with further tubes / rehab admission versus less aggressive approaches including palliative-only approach she has elected palliative only. Palliative care consultation performed 3/29 with plan for DC to hospice close to her home.   Consults: Palliative Care  Significant Diagnostic Studies: labs: K >7, Cr >3, CT  with large urinoma and progressive internal anastamotic dehiscence.   Treatments: IV hydration and antibiotics: ceftriaxone  Discharge Exam: Blood pressure 124/55, pulse 86, temperature 97.6 F (36.4 C), temperature source Oral, resp. rate 18, height '5\' 5"'$  (1.651 m), weight 65.772 kg (145 lb), SpO2 99 %.  NAD, sleepy, stigmata of advanced cancer, AOx3 Non-labored breathign, some hiccups SNTND RLQ Urostomy with yellow urine and distal stents in situ. LLQ colostomy patent of partially formed stool. LLQ Urinoma drain with copious uriniferous fluid. No c/c/e.  Disposition: 06-Home-Health Care Svc     Medication List    ASK your doctor about these medications        metroNIDAZOLE 500 MG tablet  Commonly known as:  FLAGYL  Take 1 tablet (500 mg total) by mouth 2 (two) times daily.     oxyCODONE-acetaminophen 5-325 MG tablet  Commonly known as:  PERCOCET/ROXICET  Take 1-2 tablets by mouth every 6 (six) hours as needed for severe pain. Post-operatively     sulfamethoxazole-trimethoprim 800-160 MG tablet  Commonly known as:  BACTRIM DS,SEPTRA DS  Take 1 tablet by mouth 2 (two) times daily.         SignedAlexis Frock 03/16/2016, 10:55 AM

## 2016-03-16 NOTE — Progress Notes (Signed)
Subjective:  1 - Stage 4 Bladder Cancer - s/p open cystectomy / TAH+BSO / colostomy / colon conduit 01/27/2016 in combined Urol and Gen surg procedure. Path pT4N2.   2 - Urinoma / Urine Leak / Renal Failure - new large urinoma on admission CT 2/23 with aparant leak from butt end of colon conduit / Rt ureteral anastamosis. Some dilation of butt end of conduit, stents in adequate position. 58F foley placed across distal conduit (past abd fascia) 2/27 but still all UOP via urinoma drain. Failed multiple attempts of urinoma drain clamp and persistant large leak by CT on admission 3/28. New hyperkalemia >7 and ARF with Cr >3.   3 -  Disposition / Goals Of Care - Pt in / out of hospital for several months and failing to maintain hydration / PO nutrition and continued worsening metabolic derangement, she now desires palliative only approach.   Today "[name]" is feeling very persistently weak. Little improvement with gentle IV hydration and empiric ABX.   Objective: Vital signs in last 24 hours: Temp:  [97.5 F (36.4 C)-97.6 F (36.4 C)] 97.6 F (36.4 C) (03/29 0553) Pulse Rate:  [77-86] 86 (03/29 0553) Resp:  [18] 18 (03/29 0553) BP: (117-124)/(45-55) 124/55 mmHg (03/29 0553) SpO2:  [97 %-99 %] 99 % (03/29 0553)    Intake/Output from previous day: 03/28 0701 - 03/29 0700 In: 2385 [I.V.:2385] Out: 1000 [Urine:50; Drains:950] Intake/Output this shift: Total I/O In: 1235 [I.V.:1235] Out: 1000 [Urine:50; Drains:950]  General appearance: ill apearing, sleepy, AOx3.  Eyes: negative Nose: Nares normal. Septum midline. Mucosa normal. No drainage or sinus tenderness. Throat: lips, mucosa, and tongue normal; teeth and gums normal Neck: supple, symmetrical, trachea midline Back: symmetric, no curvature. ROM normal. No CVA tenderness. Resp: non-labored on minimal NCO2 Cardio: Nl rate GI: soft, non-tender; bowel sounds normal; no masses,  no organomegaly Extremities: extremities normal,  atraumatic, no cyanosis or edema Pulses: 2+ and symmetric Lymph nodes: Cervical, supraclavicular, and axillary nodes normal. Neurologic: Grossly normal Incision/Wound: RLQ urostomy with yellow urine, LLQ colostomy with partially formed stool, LLQ urinoma drain with fluid.   Lab Results:   Recent Labs  03/14/16 2008 03/15/16 0514  WBC 18.9* 20.0*  HGB 10.3* 10.7*  HCT 29.9* 31.9*  PLT 405* 425*   BMET  Recent Labs  03/14/16 2008 03/15/16 0514  NA 123* 124*  K 7.3* 6.8*  CL 96* 98*  CO2 15* 15*  GLUCOSE 125* 79  BUN 162* 145*  CREATININE 3.62* 3.04*  CALCIUM 8.8* 9.6   PT/INR No results for input(s): LABPROT, INR in the last 72 hours. ABG No results for input(s): PHART, HCO3 in the last 72 hours.  Invalid input(s): PCO2, PO2  Studies/Results: Ct Abdomen Pelvis W Contrast  03/14/2016  CLINICAL DATA:  Status post cystectomy, hysterectomy, ileal conduit urinary diversion and end colostomy on 01/27/2016 for bladder cancer complicated by a left ureteral leak requiring percutaneous drainage on 02/11/2016, now presenting with weakness and worsening left abdominal/ low back pain. EXAM: CT ABDOMEN AND PELVIS WITH CONTRAST TECHNIQUE: Multidetector CT imaging of the abdomen and pelvis was performed using the standard protocol following bolus administration of intravenous contrast. CONTRAST:  133m ISOVUE-300 IOPAMIDOL (ISOVUE-300) INJECTION 61% COMPARISON:  02/10/2016 CT abdomen/pelvis. FINDINGS: Lower chest: No significant pulmonary nodules or acute consolidative airspace disease. Oral contrast in the lower thoracic esophagus. Hepatobiliary: Normal liver with no liver mass. Normal gallbladder with no radiopaque cholelithiasis. Mild diffuse intrahepatic biliary ductal dilatation and dilated common bile duct (9 mm diameter),  mildly increased since 02/10/2016. No radiopaque choledocholithiasis. No appreciable obstructing biliary mass. Pancreas: Normal, with no mass or duct dilation. Spleen:  Normal size. No mass. Adrenals/Urinary Tract: Normal adrenals. The patient is status post cystectomy with ventral right abdominal ileal conduit urinary diversion. There is moderate right hydronephrosis, increased. There is mild left hydronephrosis, unchanged. The bilateral nephroureteral stents appear well positioned with the proximal right stent pigtail in the right renal pelvis in the proximal left stent pigtail in the interpolar left renal collecting system. There is gas in the left renal pelvis and left renal collecting system. There is mild abnormal urothelial wall thickening and hyperenhancement throughout the bilateral renal collecting systems and ureters. The ileal conduit is collapsed with indwelling Malecot catheter. There is apparent frank dehiscence of the distal wall of the ileal conduit near the anastomosis of the left ureter with ileal conduit (series 3/ image 33). There is a persistent crescent-shaped gas containing fluid collection in the left lower retroperitoneum measuring 7.6 x 6.4 x 9.2 cm, which demonstrates wall thickening and wall hyperenhancement, decreased from 14.7 x 10.5 x 15.5 cm on 02/10/2016. The ventral left pelvic percutaneous drainage catheter terminates at the posterior margin of this collection. Stomach/Bowel: Grossly normal stomach. Normal caliber small bowel with no small bowel wall thickening. Status post distal colectomy with end sigmoid colostomy in the ventral left abdominal wall. No wall thickening or pericolonic fat stranding in the remnant colon. The blind-ending rectum contains a small amount of fluid without appreciable rectal wall thickening. Vascular/Lymphatic: Atherosclerotic nonaneurysmal abdominal aorta. Patent portal, splenic, hepatic and renal veins. No pathologically enlarged lymph nodes in the abdomen or pelvis. Reproductive: Status post hysterectomy, with no abnormal findings at the vaginal cuff. No adnexal mass. Other: No pneumoperitoneum or ascites.  Musculoskeletal: No aggressive appearing focal osseous lesions. Severe degenerative changes at L3-4, not appreciably changed. IMPRESSION: 1. Cystectomy with ileal conduit urinary diversion. Apparent frank dehiscence of the distal ileal conduit wall near the anastomosis with the left ureter. 2. Persistent gas containing left retroperitoneal fluid collection with thick enhancing wall located adjacent to the tip of the ileal conduit, most in keeping with an infected urinoma. Percutaneous left pelvic drainage catheter terminates at the posterior margin of this collection. 3. Moderate right hydronephrosis, increased, and mild left hydronephrosis, stable, despite well-positioned bilateral nephroureteral stents. Gas in the left renal collecting system and thick enhancing urothelial walls in the bilateral ureters and renal collecting systems suggests ascending bilateral urinary tract infection. 4. Mild intrahepatic and common bile duct dilatation, slightly worsened since 02/10/2016. No radiopaque choledocholithiasis. Recommend correlation with serum bilirubin levels. If there is clinical concern for biliary obstruction, recommend further evaluation with MRI abdomen without and with IV contrast with MRCP. 5. Oral contrast in the lower thoracic esophagus, suggesting esophageal dysmotility and/or gastroesophageal reflux. Electronically Signed   By: Ilona Sorrel M.D.   On: 03/14/2016 21:38    Anti-infectives: Anti-infectives    Start     Dose/Rate Route Frequency Ordered Stop   03/15/16 0715  cefTRIAXone (ROCEPHIN) 1 g in dextrose 5 % 50 mL IVPB     1 g 100 mL/hr over 30 Minutes Intravenous Daily 03/15/16 0700     03/15/16 0030  metroNIDAZOLE (FLAGYL) tablet 500 mg  Status:  Discontinued     500 mg Oral 2 times daily 03/15/16 0021 03/15/16 0653   03/15/16 0030  sulfamethoxazole-trimethoprim (BACTRIM DS,SEPTRA DS) 800-160 MG per tablet 1 tablet  Status:  Discontinued     1 tablet Oral 2 times  daily 03/15/16 0021  03/15/16 0647      Assessment/Plan:  1 - Stage 4 Bladder Cancer - prognosis quite poor from oncologic perspective, with her current level of function and metabolic derangement she would unlikely become candidate for additional cancer-directed therapy (chemo).   2 - Urinoma / Urine Leak / Renal Failure - not responded to several attempts at tube drainage and appears to be worsening by imaging this admssion. Discussed option of bilateral nephrostomy tube placement with goal of diverting urine above leaks, but she now desires no more procedural intervention.   3 -  Disposition / Goals Of Care -Pt med with her husband and brother yesterday and they all agree on palliative only approach. She would like to try to get to hospice / home hospice if possible. Palliative care consult pending.   [facility], Jashiya Bassett 03/16/2016

## 2016-03-18 LAB — BODY FLUID CULTURE

## 2016-03-18 LAB — URINE CULTURE: Culture: >=100000

## 2016-04-18 DEATH — deceased

## 2016-06-15 IMAGING — CT CT ABD-PELV W/ CM
2 of 6 series · 14 of 46 positions shown, 16 images · IV contrast (iopamidol)
Comparison: 02/10/2016 CT abdomen/pelvis.

CLINICAL DATA: Status post cystectomy, hysterectomy, ileal conduit
urinary diversion and end colostomy on 01/27/2016 for bladder cancer
complicated by a left ureteral leak requiring percutaneous drainage
on 02/11/2016, now presenting with weakness and worsening left
abdominal/ low back pain.

EXAM:
CT ABDOMEN AND PELVIS WITH CONTRAST
TECHNIQUE: Multidetector CT imaging of the abdomen and pelvis was performed
using the standard protocol following bolus administration of
intravenous contrast.
CONTRAST:  100mL VTEVRQ-Z88 IOPAMIDOL (VTEVRQ-Z88) INJECTION 61%

[Series 2: abd/pel with · axial · 0.82mm/px · z∈[+1519,+1934]mm · 11 of 97 slices shown, 13 images]
[im 7/97  soft-tissue]
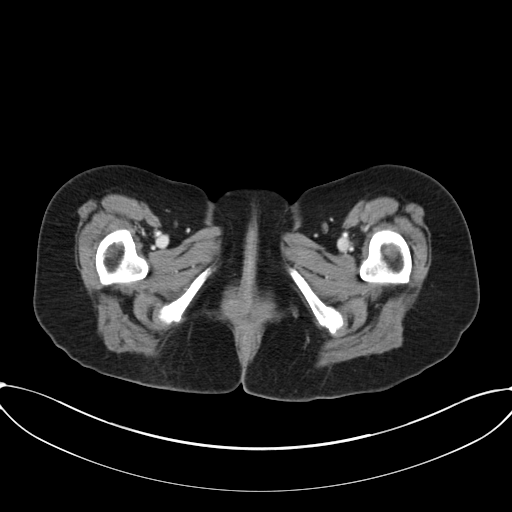
[im 7/97  bone]
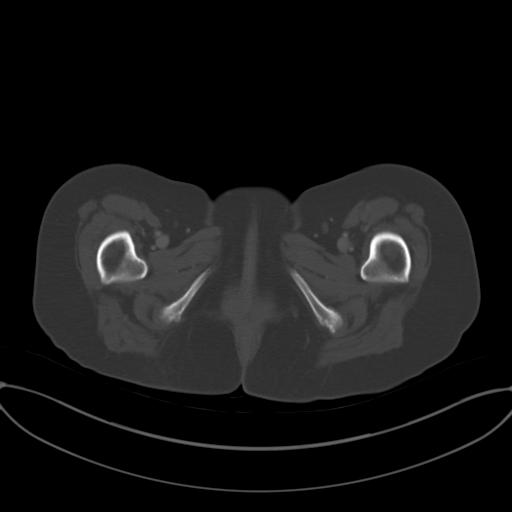
[im 13/97  soft-tissue]
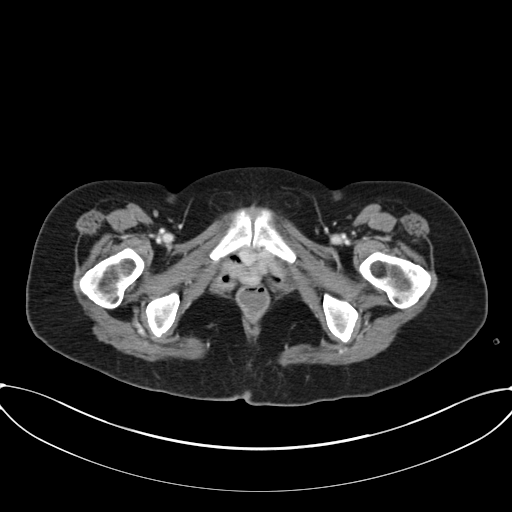
[im 26/97  soft-tissue]
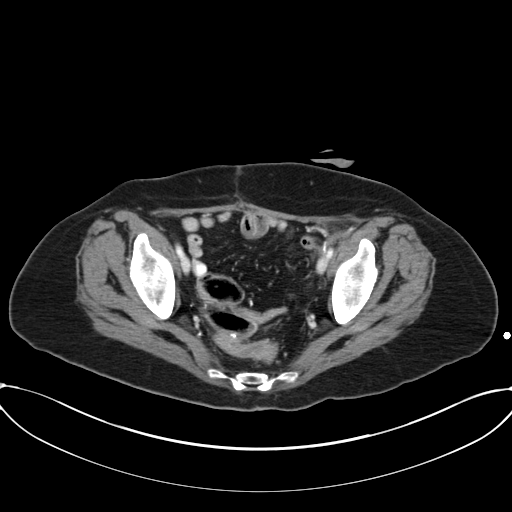
[im 33/97  soft-tissue]
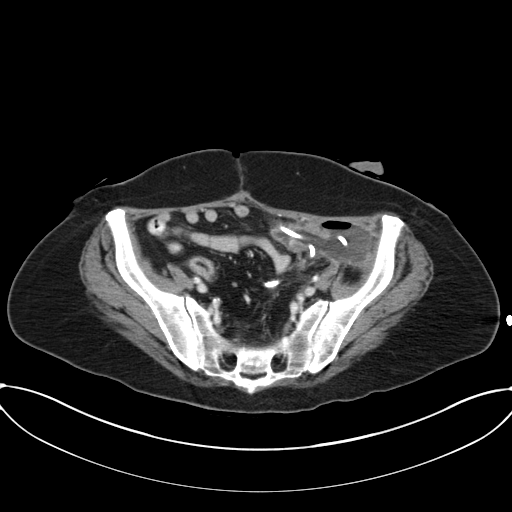
[im 39/97  soft-tissue]
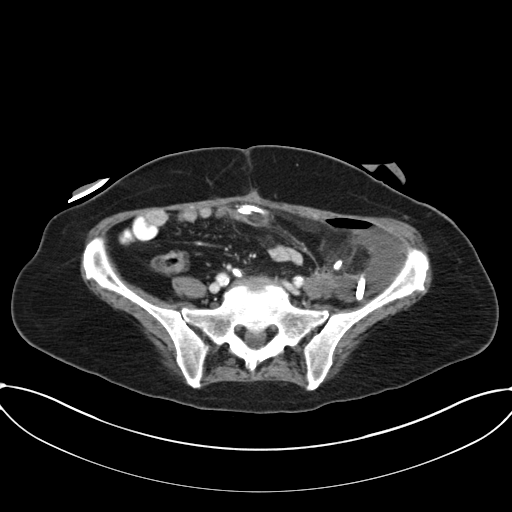
[im 52/97  soft-tissue]
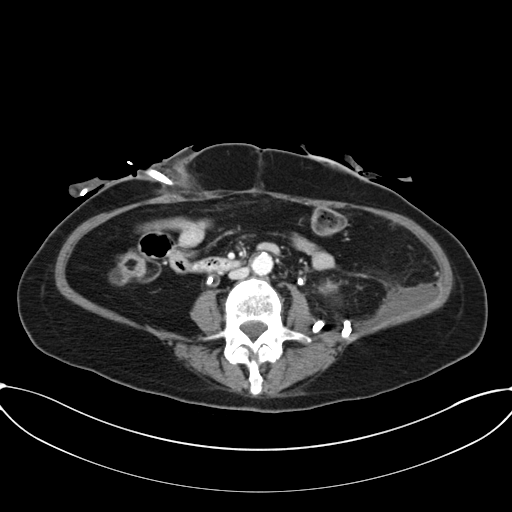
[im 58/97  soft-tissue]
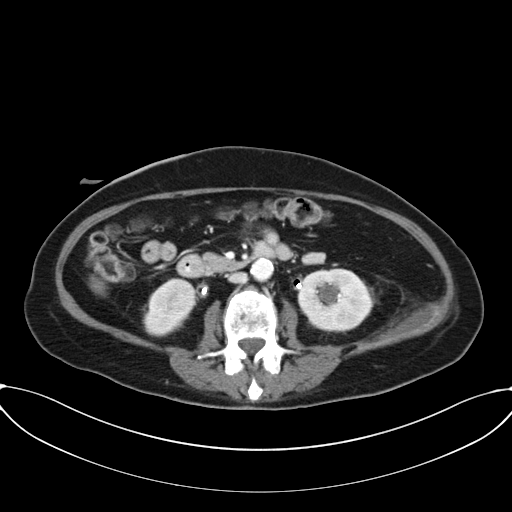
[im 65/97  soft-tissue]
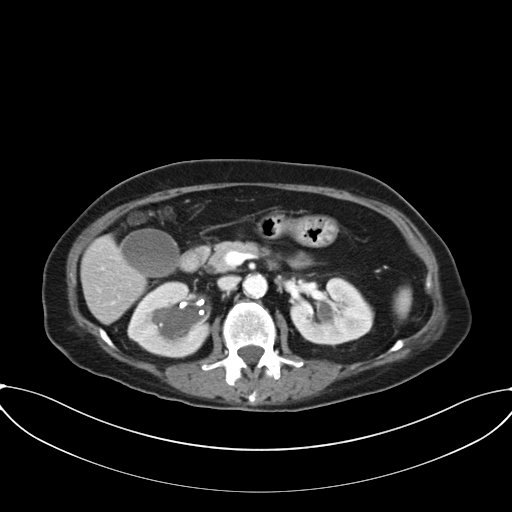
[im 71/97  soft-tissue]
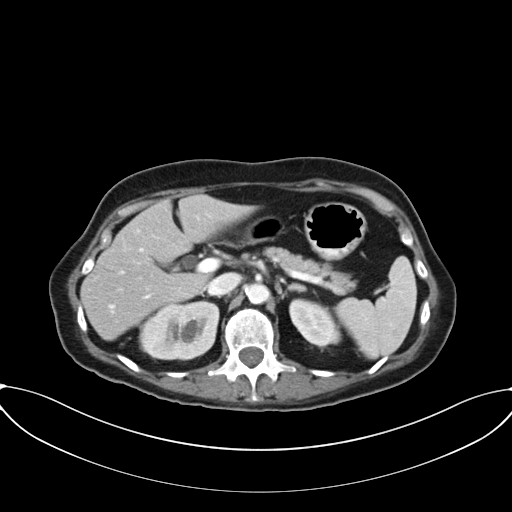
[im 71/97  bone]
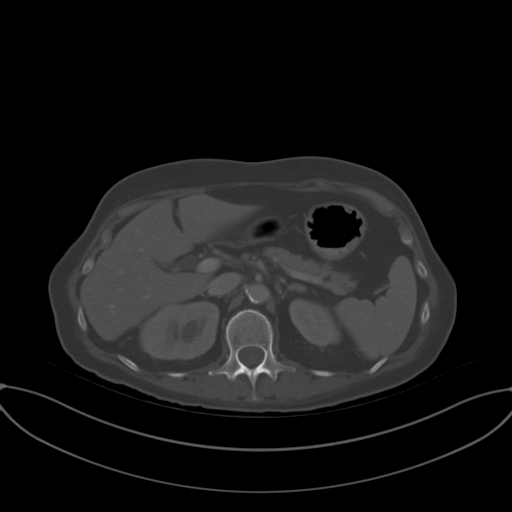
[im 84/97  soft-tissue]
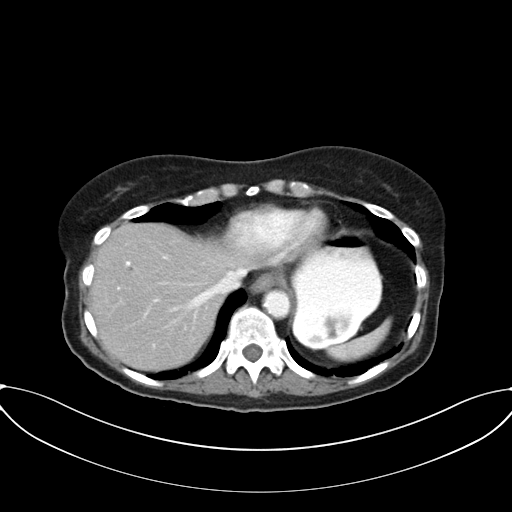
[im 90/97  soft-tissue]
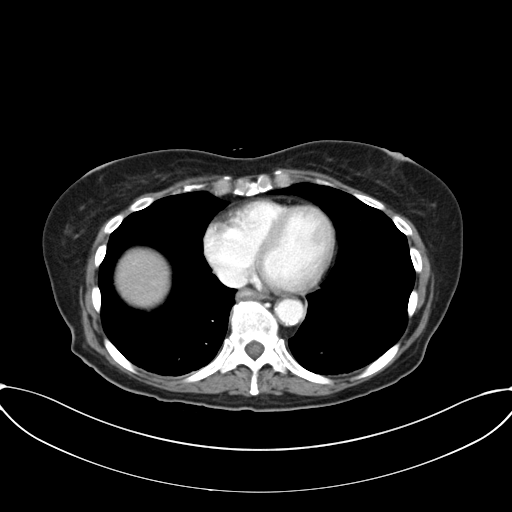

[Series 3: coronal a/|p · coronal · 0.74mm/px · 3 of 74 slices shown]
[im 25/74  soft-tissue]
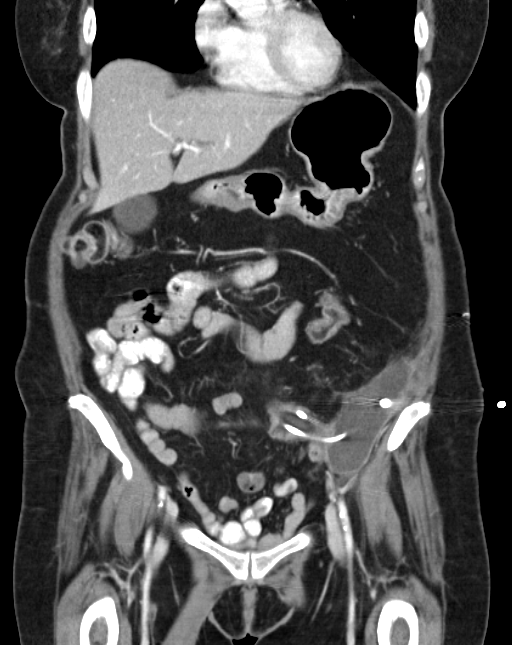
[im 33/74  soft-tissue]
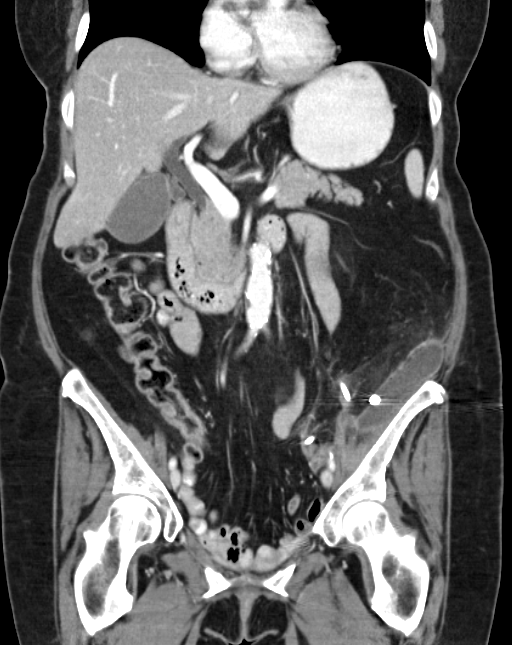
[im 41/74  soft-tissue]
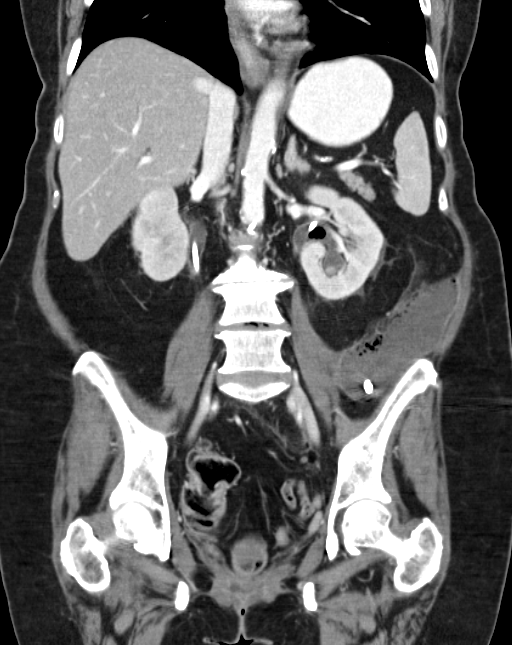

[14 of 46 positions shown; findings below may reference images not displayed]

FINDINGS: Lower chest: No significant pulmonary nodules or acute consolidative
airspace disease. Oral contrast in the lower thoracic esophagus.

Hepatobiliary: Normal liver with no liver mass. Normal gallbladder
with no radiopaque cholelithiasis. Mild diffuse intrahepatic biliary
ductal dilatation and dilated common bile duct (9 mm diameter),
mildly increased since 02/10/2016. No radiopaque
choledocholithiasis. No appreciable obstructing biliary mass.

Pancreas: Normal, with no mass or duct dilation.

Spleen: Normal size. No mass.

Adrenals/Urinary Tract: Normal adrenals. The patient is status post
cystectomy with ventral right abdominal ileal conduit urinary
diversion. There is moderate right hydronephrosis, increased. There
is mild left hydronephrosis, unchanged. The bilateral nephroureteral
stents appear well positioned with the proximal right stent pigtail
in the right renal pelvis in the proximal left stent pigtail in the
interpolar left renal collecting system. There is gas in the left
renal pelvis and left renal collecting system. There is mild
abnormal urothelial wall thickening and hyperenhancement throughout
the bilateral renal collecting systems and ureters. The ileal
conduit is collapsed with indwelling Malecot catheter. There is
apparent frank dehiscence of the distal wall of the ileal conduit
near the anastomosis of the left ureter with ileal conduit (series
3/ image 33). There is a persistent crescent-shaped gas containing
fluid collection in the left lower retroperitoneum measuring 7.6 x
6.4 x 9.2 cm, which demonstrates wall thickening and wall
hyperenhancement, decreased from 14.7 x 10.5 x 15.5 cm on
02/10/2016. The ventral left pelvic percutaneous drainage catheter
terminates at the posterior margin of this collection.

Stomach/Bowel: Grossly normal stomach. Normal caliber small bowel
with no small bowel wall thickening. Status post distal colectomy
with end sigmoid colostomy in the ventral left abdominal wall. No
wall thickening or pericolonic fat stranding in the remnant colon.
The blind-ending rectum contains a small amount of fluid without
appreciable rectal wall thickening.

Vascular/Lymphatic: Atherosclerotic nonaneurysmal abdominal aorta.
Patent portal, splenic, hepatic and renal veins. No pathologically
enlarged lymph nodes in the abdomen or pelvis.

Reproductive: Status post hysterectomy, with no abnormal findings at
the vaginal cuff. No adnexal mass.

Other: No pneumoperitoneum or ascites.

Musculoskeletal: No aggressive appearing focal osseous lesions.
Severe degenerative changes at L3-4, not appreciably changed.
IMPRESSION: 1. Cystectomy with ileal conduit urinary diversion. Apparent frank
dehiscence of the distal ileal conduit wall near the anastomosis
with the left ureter.
2. Persistent gas containing left retroperitoneal fluid collection
with thick enhancing wall located adjacent to the tip of the ileal
conduit, most in keeping with an infected urinoma. Percutaneous left
pelvic drainage catheter terminates at the posterior margin of this
collection.
3. Moderate right hydronephrosis, increased, and mild left
hydronephrosis, stable, despite well-positioned bilateral
nephroureteral stents. Gas in the left renal collecting system and
thick enhancing urothelial walls in the bilateral ureters and renal
collecting systems suggests ascending bilateral urinary tract
infection.
4. Mild intrahepatic and common bile duct dilatation, slightly
worsened since 02/10/2016. No radiopaque choledocholithiasis.
Recommend correlation with serum bilirubin levels. If there is
clinical concern for biliary obstruction, recommend further
evaluation with MRI abdomen without and with IV contrast with MRCP.
5. Oral contrast in the lower thoracic esophagus, suggesting
esophageal dysmotility and/or gastroesophageal reflux.
